# Patient Record
Sex: Female | Born: 1970
Health system: Southern US, Community
[De-identification: ages and names within clinical notes are randomized; demographics above are authoritative.]

## PROBLEM LIST (undated history)

## (undated) DIAGNOSIS — A499 Bacterial infection, unspecified: Secondary | ICD-10-CM

## (undated) DIAGNOSIS — N942 Vaginismus: Secondary | ICD-10-CM

## (undated) DIAGNOSIS — Z9889 Other specified postprocedural states: Secondary | ICD-10-CM

## (undated) DIAGNOSIS — R112 Nausea with vomiting, unspecified: Secondary | ICD-10-CM

## (undated) DIAGNOSIS — N644 Mastodynia: Secondary | ICD-10-CM

## (undated) DIAGNOSIS — D219 Benign neoplasm of connective and other soft tissue, unspecified: Secondary | ICD-10-CM

## (undated) DIAGNOSIS — B379 Candidiasis, unspecified: Secondary | ICD-10-CM

## (undated) DIAGNOSIS — M1990 Unspecified osteoarthritis, unspecified site: Secondary | ICD-10-CM

## (undated) HISTORY — DX: Unspecified osteoarthritis, unspecified site: M19.90

## (undated) HISTORY — DX: Bacterial infection, unspecified: A49.9

## (undated) HISTORY — DX: Vaginismus: N94.2

## (undated) HISTORY — PX: WISDOM TOOTH EXTRACTION: SHX21

## (undated) HISTORY — DX: Candidiasis, unspecified: B37.9

## (undated) HISTORY — PX: FOOT SURGERY: SHX648

## (undated) HISTORY — DX: Mastodynia: N64.4

---

## 2000-07-15 ENCOUNTER — Other Ambulatory Visit: Admission: RE | Admit: 2000-07-15 | Discharge: 2000-07-15 | Payer: Self-pay | Admitting: *Deleted

## 2005-03-29 HISTORY — PX: HAMMER TOE SURGERY: SHX385

## 2006-11-16 LAB — CONVERTED CEMR LAB: Pap Smear: NORMAL

## 2006-12-09 ENCOUNTER — Encounter: Admission: RE | Admit: 2006-12-09 | Discharge: 2006-12-09 | Payer: Self-pay | Admitting: Obstetrics and Gynecology

## 2007-03-03 ENCOUNTER — Ambulatory Visit: Payer: Self-pay | Admitting: Family Medicine

## 2007-03-03 DIAGNOSIS — Z9189 Other specified personal risk factors, not elsewhere classified: Secondary | ICD-10-CM

## 2007-03-07 ENCOUNTER — Encounter (INDEPENDENT_AMBULATORY_CARE_PROVIDER_SITE_OTHER): Payer: Self-pay | Admitting: *Deleted

## 2007-03-07 LAB — CONVERTED CEMR LAB
BUN: 7 mg/dL (ref 6–23)
Basophils Absolute: 0 10*3/uL (ref 0.0–0.1)
Bilirubin, Direct: 0.1 mg/dL (ref 0.0–0.3)
CO2: 29 meq/L (ref 19–32)
Chloride: 102 meq/L (ref 96–112)
Cholesterol: 196 mg/dL (ref 0–200)
Eosinophils Relative: 1.3 % (ref 0.0–5.0)
HCT: 36.3 % (ref 36.0–46.0)
HDL: 71.5 mg/dL (ref >39.0)
Monocytes Absolute: 0.3 10*3/uL (ref 0.2–0.7)
Neutro Abs: 1.9 10*3/uL (ref 1.4–7.7)
Sodium: 137 meq/L (ref 135–145)
Total Protein: 7.3 g/dL (ref 6.0–8.3)
Triglycerides: 87 mg/dL (ref 0–149)
VLDL: 17 mg/dL (ref 0–40)

## 2007-05-16 ENCOUNTER — Ambulatory Visit: Payer: Self-pay | Admitting: Family Medicine

## 2007-05-16 DIAGNOSIS — M21612 Bunion of left foot: Secondary | ICD-10-CM | POA: Insufficient documentation

## 2007-05-16 DIAGNOSIS — M21619 Bunion of unspecified foot: Secondary | ICD-10-CM

## 2007-05-16 DIAGNOSIS — S93409A Sprain of unspecified ligament of unspecified ankle, initial encounter: Secondary | ICD-10-CM | POA: Insufficient documentation

## 2007-06-29 ENCOUNTER — Encounter: Payer: Self-pay | Admitting: Family Medicine

## 2007-11-28 ENCOUNTER — Ambulatory Visit: Payer: Self-pay | Admitting: Family Medicine

## 2007-11-30 ENCOUNTER — Encounter: Admission: RE | Admit: 2007-11-30 | Discharge: 2008-01-29 | Payer: Self-pay | Admitting: Obstetrics and Gynecology

## 2007-12-07 ENCOUNTER — Encounter (INDEPENDENT_AMBULATORY_CARE_PROVIDER_SITE_OTHER): Payer: Self-pay | Admitting: *Deleted

## 2007-12-18 ENCOUNTER — Encounter: Admission: RE | Admit: 2007-12-18 | Discharge: 2007-12-18 | Payer: Self-pay | Admitting: Obstetrics and Gynecology

## 2008-02-07 ENCOUNTER — Ambulatory Visit: Payer: Self-pay | Admitting: Family Medicine

## 2008-02-07 DIAGNOSIS — M549 Dorsalgia, unspecified: Secondary | ICD-10-CM | POA: Insufficient documentation

## 2008-02-07 DIAGNOSIS — M1990 Unspecified osteoarthritis, unspecified site: Secondary | ICD-10-CM | POA: Insufficient documentation

## 2008-02-14 ENCOUNTER — Telehealth: Payer: Self-pay | Admitting: Family Medicine

## 2008-02-26 ENCOUNTER — Ambulatory Visit: Payer: Self-pay | Admitting: Family Medicine

## 2008-02-26 ENCOUNTER — Telehealth: Payer: Self-pay | Admitting: Family Medicine

## 2008-02-26 DIAGNOSIS — M79609 Pain in unspecified limb: Secondary | ICD-10-CM | POA: Insufficient documentation

## 2008-02-26 DIAGNOSIS — E663 Overweight: Secondary | ICD-10-CM | POA: Insufficient documentation

## 2008-02-27 ENCOUNTER — Telehealth (INDEPENDENT_AMBULATORY_CARE_PROVIDER_SITE_OTHER): Payer: Self-pay | Admitting: *Deleted

## 2008-03-01 ENCOUNTER — Telehealth (INDEPENDENT_AMBULATORY_CARE_PROVIDER_SITE_OTHER): Payer: Self-pay | Admitting: *Deleted

## 2008-03-01 ENCOUNTER — Telehealth: Payer: Self-pay | Admitting: Family Medicine

## 2008-03-01 LAB — CONVERTED CEMR LAB
Alkaline Phosphatase: 48 units/L (ref 39–117)
BUN: 15 mg/dL (ref 6–23)
Bilirubin, Direct: 0.1 mg/dL (ref 0.0–0.3)
Chloride: 108 meq/L (ref 96–112)
Cholesterol: 218 mg/dL (ref 0–200)
GFR calc non Af Amer: 59 mL/min
Glucose, Bld: 81 mg/dL (ref 70–99)
HDL: 66 mg/dL (ref >39.0)
Total Bilirubin: 0.6 mg/dL (ref 0.3–1.2)
Total Protein: 7.5 g/dL (ref 6.0–8.3)
Vit D, 1,25-Dihydroxy: 22 — ABNORMAL LOW (ref 30–89)

## 2008-03-05 ENCOUNTER — Telehealth (INDEPENDENT_AMBULATORY_CARE_PROVIDER_SITE_OTHER): Payer: Self-pay | Admitting: *Deleted

## 2008-04-05 ENCOUNTER — Telehealth (INDEPENDENT_AMBULATORY_CARE_PROVIDER_SITE_OTHER): Payer: Self-pay | Admitting: *Deleted

## 2008-04-05 ENCOUNTER — Ambulatory Visit: Payer: Self-pay | Admitting: Family Medicine

## 2008-04-05 DIAGNOSIS — E559 Vitamin D deficiency, unspecified: Secondary | ICD-10-CM | POA: Insufficient documentation

## 2008-04-08 ENCOUNTER — Telehealth (INDEPENDENT_AMBULATORY_CARE_PROVIDER_SITE_OTHER): Payer: Self-pay | Admitting: *Deleted

## 2008-04-10 ENCOUNTER — Telehealth (INDEPENDENT_AMBULATORY_CARE_PROVIDER_SITE_OTHER): Payer: Self-pay | Admitting: *Deleted

## 2008-04-12 ENCOUNTER — Encounter (INDEPENDENT_AMBULATORY_CARE_PROVIDER_SITE_OTHER): Payer: Self-pay | Admitting: *Deleted

## 2008-04-19 ENCOUNTER — Telehealth (INDEPENDENT_AMBULATORY_CARE_PROVIDER_SITE_OTHER): Payer: Self-pay | Admitting: *Deleted

## 2008-07-12 ENCOUNTER — Ambulatory Visit: Payer: Self-pay | Admitting: Family Medicine

## 2008-07-19 ENCOUNTER — Encounter (INDEPENDENT_AMBULATORY_CARE_PROVIDER_SITE_OTHER): Payer: Self-pay | Admitting: *Deleted

## 2008-08-15 ENCOUNTER — Ambulatory Visit: Payer: Self-pay | Admitting: Family Medicine

## 2008-08-15 DIAGNOSIS — J301 Allergic rhinitis due to pollen: Secondary | ICD-10-CM | POA: Insufficient documentation

## 2008-08-15 DIAGNOSIS — R635 Abnormal weight gain: Secondary | ICD-10-CM | POA: Insufficient documentation

## 2008-08-18 LAB — CONVERTED CEMR LAB
Calcium: 9 mg/dL (ref 8.4–10.5)
Creatinine, Ser: 1 mg/dL (ref 0.4–1.2)
Free T4: 0.7 ng/dL (ref 0.6–1.6)
GFR calc non Af Amer: 79.35 mL/min (ref >60)
Glucose, Bld: 82 mg/dL (ref 70–99)
Potassium: 4.3 meq/L (ref 3.5–5.1)
TSH: 0.95 microintl units/mL (ref 0.35–5.50)

## 2008-08-19 ENCOUNTER — Encounter (INDEPENDENT_AMBULATORY_CARE_PROVIDER_SITE_OTHER): Payer: Self-pay | Admitting: *Deleted

## 2008-09-10 ENCOUNTER — Ambulatory Visit: Payer: Self-pay | Admitting: Family Medicine

## 2008-10-14 ENCOUNTER — Ambulatory Visit: Payer: Self-pay | Admitting: Family Medicine

## 2008-12-13 ENCOUNTER — Ambulatory Visit: Payer: Self-pay | Admitting: Family Medicine

## 2008-12-19 ENCOUNTER — Encounter: Admission: RE | Admit: 2008-12-19 | Discharge: 2008-12-19 | Payer: Self-pay | Admitting: Obstetrics and Gynecology

## 2008-12-20 ENCOUNTER — Telehealth (INDEPENDENT_AMBULATORY_CARE_PROVIDER_SITE_OTHER): Payer: Self-pay | Admitting: *Deleted

## 2008-12-24 ENCOUNTER — Encounter: Payer: Self-pay | Admitting: Family Medicine

## 2008-12-24 LAB — CONVERTED CEMR LAB
Albumin: 3.9 g/dL (ref 3.5–5.2)
Alkaline Phosphatase: 48 units/L (ref 39–117)
BUN: 10 mg/dL (ref 6–23)
Basophils Relative: 0.3 % (ref 0.0–3.0)
Bilirubin, Direct: 0 mg/dL (ref 0.0–0.3)
Calcium: 9.3 mg/dL (ref 8.4–10.5)
Chloride: 108 meq/L (ref 96–112)
Creatinine, Ser: 1.1 mg/dL (ref 0.4–1.2)
Eosinophils Absolute: 0.1 10*3/uL (ref 0.0–0.7)
GFR calc non Af Amer: 70.97 mL/min (ref >60)
Glucose, Bld: 85 mg/dL (ref 70–99)
HCT: 37.5 % (ref 36.0–46.0)
Hemoglobin: 12.9 g/dL (ref 12.0–15.0)
Lymphs Abs: 1.6 10*3/uL (ref 0.7–4.0)
Monocytes Absolute: 0.2 10*3/uL (ref 0.1–1.0)
Neutro Abs: 2.1 10*3/uL (ref 1.4–7.7)
Platelets: 206 10*3/uL (ref 150.0–400.0)
RBC: 4 M/uL (ref 3.87–5.11)
Total Protein: 7.5 g/dL (ref 6.0–8.3)
Triglycerides: 97 mg/dL (ref 0.0–149.0)
VLDL: 19.4 mg/dL (ref 0.0–40.0)

## 2009-01-15 ENCOUNTER — Telehealth: Payer: Self-pay | Admitting: Family Medicine

## 2009-01-15 ENCOUNTER — Ambulatory Visit: Payer: Self-pay | Admitting: Family Medicine

## 2009-01-15 DIAGNOSIS — J069 Acute upper respiratory infection, unspecified: Secondary | ICD-10-CM | POA: Insufficient documentation

## 2009-02-11 ENCOUNTER — Ambulatory Visit: Payer: Self-pay | Admitting: Family Medicine

## 2009-02-18 ENCOUNTER — Telehealth (INDEPENDENT_AMBULATORY_CARE_PROVIDER_SITE_OTHER): Payer: Self-pay | Admitting: *Deleted

## 2009-03-14 ENCOUNTER — Ambulatory Visit: Payer: Self-pay | Admitting: Family Medicine

## 2009-03-24 ENCOUNTER — Telehealth: Payer: Self-pay | Admitting: Family Medicine

## 2009-03-24 DIAGNOSIS — M25569 Pain in unspecified knee: Secondary | ICD-10-CM | POA: Insufficient documentation

## 2009-03-27 ENCOUNTER — Encounter: Payer: Self-pay | Admitting: Internal Medicine

## 2009-10-08 ENCOUNTER — Ambulatory Visit: Payer: Self-pay | Admitting: Family Medicine

## 2009-10-08 ENCOUNTER — Encounter (INDEPENDENT_AMBULATORY_CARE_PROVIDER_SITE_OTHER): Payer: Self-pay | Admitting: *Deleted

## 2009-10-08 ENCOUNTER — Telehealth: Payer: Self-pay | Admitting: Family Medicine

## 2009-10-20 ENCOUNTER — Telehealth (INDEPENDENT_AMBULATORY_CARE_PROVIDER_SITE_OTHER): Payer: Self-pay | Admitting: *Deleted

## 2009-10-21 ENCOUNTER — Ambulatory Visit: Payer: Self-pay | Admitting: Family Medicine

## 2010-03-25 ENCOUNTER — Ambulatory Visit
Admission: RE | Admit: 2010-03-25 | Discharge: 2010-03-25 | Payer: Self-pay | Source: Home / Self Care | Attending: Internal Medicine | Admitting: Internal Medicine

## 2010-03-27 ENCOUNTER — Encounter
Admission: RE | Admit: 2010-03-27 | Discharge: 2010-03-27 | Payer: Self-pay | Source: Home / Self Care | Attending: Obstetrics and Gynecology | Admitting: Obstetrics and Gynecology

## 2010-04-03 ENCOUNTER — Telehealth: Payer: Self-pay | Admitting: Family Medicine

## 2010-04-10 ENCOUNTER — Ambulatory Visit
Admission: RE | Admit: 2010-04-10 | Discharge: 2010-04-10 | Payer: Self-pay | Source: Home / Self Care | Attending: Family Medicine | Admitting: Family Medicine

## 2010-04-10 ENCOUNTER — Ambulatory Visit (HOSPITAL_BASED_OUTPATIENT_CLINIC_OR_DEPARTMENT_OTHER)
Admission: RE | Admit: 2010-04-10 | Discharge: 2010-04-10 | Payer: Self-pay | Source: Home / Self Care | Attending: Family Medicine | Admitting: Family Medicine

## 2010-04-13 ENCOUNTER — Telehealth: Payer: Self-pay | Admitting: Family Medicine

## 2010-04-16 ENCOUNTER — Telehealth (INDEPENDENT_AMBULATORY_CARE_PROVIDER_SITE_OTHER): Payer: Self-pay | Admitting: *Deleted

## 2010-04-17 ENCOUNTER — Ambulatory Visit
Admission: RE | Admit: 2010-04-17 | Discharge: 2010-04-17 | Payer: Self-pay | Source: Home / Self Care | Attending: Family Medicine | Admitting: Family Medicine

## 2010-04-17 ENCOUNTER — Encounter: Payer: Self-pay | Admitting: Family Medicine

## 2010-04-17 DIAGNOSIS — R05 Cough: Secondary | ICD-10-CM | POA: Insufficient documentation

## 2010-04-17 DIAGNOSIS — R053 Chronic cough: Secondary | ICD-10-CM | POA: Insufficient documentation

## 2010-04-19 ENCOUNTER — Encounter: Payer: Self-pay | Admitting: Obstetrics and Gynecology

## 2010-04-26 LAB — CONVERTED CEMR LAB
ALT: 20 units/L (ref 0–35)
ALT: 28 units/L (ref 0–35)
AST: 24 units/L (ref 0–37)
AST: 25 units/L (ref 0–37)
AST: 37 units/L (ref 0–37)
Alkaline Phosphatase: 46 units/L (ref 39–117)
BUN: 11 mg/dL (ref 6–23)
Basophils Absolute: 0 10*3/uL (ref 0.0–0.1)
Basophils Absolute: 0.1 10*3/uL (ref 0.0–0.1)
Basophils Relative: 0.6 % (ref 0.0–3.0)
Bilirubin, Direct: 0.1 mg/dL (ref 0.0–0.3)
Bilirubin, Direct: 0.1 mg/dL (ref 0.0–0.3)
Bilirubin, Direct: 0.1 mg/dL (ref 0.0–0.3)
Blood in Urine, dipstick: NEGATIVE
CO2: 25 meq/L (ref 19–32)
CO2: 27 meq/L (ref 19–32)
Calcium: 9.1 mg/dL (ref 8.4–10.5)
Calcium: 9.3 mg/dL (ref 8.4–10.5)
Chloride: 107 meq/L (ref 96–112)
Chloride: 107 meq/L (ref 96–112)
Creatinine, Ser: 1 mg/dL (ref 0.4–1.2)
Creatinine, Ser: 1.1 mg/dL (ref 0.4–1.2)
Direct LDL: 112.5 mg/dL
Eosinophils Absolute: 0 10*3/uL (ref 0.0–0.7)
HCT: 38.4 % (ref 36.0–46.0)
HDL: 51.7 mg/dL (ref >39.0)
Hemoglobin: 13.2 g/dL (ref 12.0–15.0)
Ketones, urine, test strip: NEGATIVE
LDL Cholesterol: 100 mg/dL — ABNORMAL HIGH (ref 0–99)
LDL Cholesterol: 129 mg/dL — ABNORMAL HIGH (ref 0–99)
Lymphocytes Relative: 28.4 % (ref 12.0–46.0)
Lymphocytes Relative: 36.8 % (ref 12.0–46.0)
MCHC: 34.6 g/dL (ref 30.0–36.0)
MCV: 92.8 fL (ref 78.0–100.0)
MCV: 95.4 fL (ref 78.0–100.0)
Monocytes Absolute: 0.2 10*3/uL (ref 0.1–1.0)
Monocytes Absolute: 0.3 10*3/uL (ref 0.1–1.0)
Monocytes Relative: 6.2 % (ref 3.0–12.0)
Monocytes Relative: 9.9 % (ref 3.0–12.0)
Neutrophils Relative %: 55.1 % (ref 43.0–77.0)
Neutrophils Relative %: 58.9 % (ref 43.0–77.0)
Nitrite: NEGATIVE
Platelets: 191 10*3/uL (ref 150.0–400.0)
Potassium: 4 meq/L (ref 3.5–5.1)
Protein, U semiquant: NEGATIVE
Sodium: 140 meq/L (ref 135–145)
Total Bilirubin: 0.6 mg/dL (ref 0.3–1.2)
Total Protein: 7.4 g/dL (ref 6.0–8.3)
Total Protein: 7.5 g/dL (ref 6.0–8.3)
Triglycerides: 53 mg/dL (ref 0.0–149.0)
Triglycerides: 91 mg/dL (ref 0–149)
Triglycerides: 97 mg/dL (ref 0.0–149.0)
Urobilinogen, UA: NEGATIVE
Vit D, 25-Hydroxy: 30 ng/mL (ref 30–89)
WBC: 2.7 10*3/uL — ABNORMAL LOW (ref 4.5–10.5)

## 2010-04-28 NOTE — Letter (Signed)
Summary: [facility]: Immunoassay Fecal Occult Blood (iFOB) Order Form  [facility] [place]  [address] [place], [place] [address]   Phone: [phone]  Fax: [phone]       Lab: Immunoassay Fecal Occult Blood (iFOB) Order Form   October 08, 2009 MRN: [id number]   Shelley Norman [birth year]   Physicican Name:______dr lowne___________________  Diagnosis Code:_______v76.51___________________      Jeremy Johann CMA

## 2010-04-28 NOTE — Progress Notes (Signed)
Summary: Refill Request  Phone Note Call from Patient Message from:  Patient on October 20, 2009 1:15 PM  Refills Requested: Medication #1:  BEE POLLEN/ROYAL JELLY/HONEY 1000 MG TABS 1 by mouth once daily   Dosage confirmed as above?Dosage Confirmed Need the bee pollen without the royal jelly  Reason for Call: Refill Medication Summary of Call: Patient needs new rx for her bee pollen without the royal jelly and the new vitmain d 2000u sent to [facility] on [address]. This is mainly for her insuance purposes. Thanks! Initial call taken by: Harold Barban,  October 20, 2009 1:16 PM    New/Updated Medications: VITAMIN D3 2000 UNIT TABS (CHOLECALCIFEROL) take 1 tab once daily Prescriptions: VITAMIN D3 2000 UNIT TABS (CHOLECALCIFEROL) take 1 tab once daily  #30 x 11   Entered by:   Jeremy Johann CMA   Authorized by:   Loreen Freud DO   Signed by:   Jeremy Johann CMA on 10/20/2009   Method used:   Re-Faxed to ...       [facility]  [address] [id number]* (retail)       [address]       [place], [place]  [address]       Ph: [phone] or [phone]       Fax: [phone]   RxID:   [id number] BEE POLLEN/ROYAL JELLY/HONEY 1000 MG TABS (NUTRITIONAL SUPPLEMENTS) 1 by mouth once daily  #30 x 11   Entered by:   Jeremy Johann CMA   Authorized by:   Loreen Freud DO   Signed by:   Jeremy Johann CMA on 10/20/2009   Method used:   Re-Faxed to ...       [facility]  [address] [id number]* (retail)       [address]       [place], [place]  [address]       Ph: [phone] or [phone]       Fax: [phone]   RxID:   [id number]

## 2010-04-28 NOTE — Consult Note (Signed)
Summary: [facility]  [facility]   Imported By: Lanelle Bal 04/10/2009 08:05:50  _____________________________________________________________________  External Attachment:    Type:   Image     Comment:   External Document

## 2010-04-28 NOTE — Progress Notes (Signed)
Summary: question about blood counts  Phone Note Call from Patient   Caller: Patient Summary of Call: Pt walks in and would like for somebody to call her to let her know if there is anything she can do to increase her white and red blood count. pt can be reached at [phone]. Initial call taken by: Lavell Islam,  October 20, 2009 4:25 PM  Follow-up for Phone Call        pls advise............Marland KitchenFelecia Deloach CMA  October 20, 2009 4:37 PM   Additional Follow-up for Phone Call Additional follow up Details #1::        to increase it ---there is nothing we can do -- there is nothing that needs to be done ---if the rest of the cbc was abnormal we would consider hematology referral but can be normal for AA to have low WBC Additional Follow-up by: Loreen Freud DO,  October 20, 2009 4:42 PM    Additional Follow-up for Phone Call Additional follow up Details #2::    left message to call  office.............Marland KitchenFelecia Deloach CMA  October 20, 2009 5:13 PM   Patient called and LM on triage line asking about lab results, would like call back.Harold Barban  October 21, 2009 1:08 PM  lmtcb.Harold Barban  October 21, 2009 1:08 PM  Additional Follow-up for Phone Call Additional follow up Details #3:: Details for Additional Follow-up Action Taken: DISCUSS WITH PATIENT .......Marland KitchenFelecia Deloach CMA  October 21, 2009 4:09 PM

## 2010-04-28 NOTE — Progress Notes (Signed)
Summary: refill  Phone Note Call from Patient   Caller: Patient Summary of Call: patient was seen [phone] - she said rx for vit d 1000 units was called in but she said she also takes 04540 units & that wasnt called in [facility] [address] Initial call taken by: Okey Regal Spring,  October 08, 2009 11:28 AM  Follow-up for Phone Call        Per office visit note patient is taking both, sent prescription. Left message on machine notifying patient. Follow-up by: Lucious Groves,  October 08, 2009 11:30 AM    New/Updated Medications: VITAMIN D (ERGOCALCIFEROL) 50000 UNIT CAPS (ERGOCALCIFEROL) 1 by mouth weekly Prescriptions: VITAMIN D (ERGOCALCIFEROL) 50000 UNIT CAPS (ERGOCALCIFEROL) 1 by mouth weekly  #4 x 2   Entered by:   Lucious Groves   Authorized by:   Loreen Freud DO   Signed by:   Lucious Groves on 10/08/2009   Method used:   Electronically to        [facility]  [address] [id number]* (retail)       [address]       [place], [place]  [address]       Ph: [phone] or [phone]       Fax: [phone]   RxID:   [id number]     Allergies: 1)  ! Tetracycline

## 2010-04-28 NOTE — Assessment & Plan Note (Signed)
Summary: cpx/fasting//kn   Vital Signs:  Patient profile:   40 year old female Height:      62 inches Weight:      148 pounds BMI:     27.17 Temp:     98.5 degrees F oral Pulse rate:   78 / minute Resp:     18 per minute BP sitting:   90 / 58  (left arm)  Vitals Entered By: Jeremy Johann CMA (October 08, 2009 9:22 AM)  nCC: cpx, fasting, no pap Comments REVIEWED MED LIST, PATIENT AGREED DOSE AND INSTRUCTION CORRECT    History of Present Illness: Pt here for cpe and labs.  Pt now has new job with [employer].  Pt neeeds rx for all otc meds she takes.  No other complaints.    Preventive Screening-Counseling & Management  Alcohol-Tobacco     Alcohol drinks/day: <1     Alcohol type: wine, brandy     Smoking Status: never     Passive Smoke Exposure: no  Caffeine-Diet-Exercise     Caffeine use/day: 0     Does Patient Exercise: yes     Type of exercise: aerobics, weights     Times/week: 4  Hep-HIV-STD-Contraception     HIV Risk: no     Dental Visit-last 6 months yes     Dental Care Counseling: not indicated; dental care within six months     SBE monthly: yes  Safety-Violence-Falls     Seat Belt Use: 100  Current Medications (verified): 1)  Ortho Tri-Cyclen Lo 0.025 Mg Tabs (Norgestimate-Ethinyl Estradiol) .... As Directed 2)  Astepro 0.15 % Soln (Azelastine Hcl) .... 2 Sprays Each Nostril Once Daily 3)  Zyrtec Allergy 10 Mg Tabs (Cetirizine Hcl) .Marland Kitchen.. 1 By Mouth Once Daily 4)  Veramyst 27.5 Mcg/spray Susp (Fluticasone Furoate) .... 2 Sprays Each Nostril Once Daily 5)  Vitamin D (Ergocalciferol) 50000 Unit Caps (Ergocalciferol) .Marland Kitchen.. 1 By Mouth Weekly 6)  Calcium Carbonate 600 Mg Tabs (Calcium Carbonate) .Marland Kitchen.. 1 By Mouth Two Times A Day 7)  Magnesium 250 Mg Tabs (Magnesium) .Marland Kitchen.. 1 By Mouth Once Daily 8)  Vitamin D3 1000 Unit Tabs (Cholecalciferol) .Marland Kitchen.. 1 By Mouth Once Daily 9)  Fish Oil 1000 Mg Caps (Omega-3 Fatty Acids) .Marland Kitchen.. 1 By Mouth Two Times A Day 10)  Osteo Bi-Flex Adv Joint  Shield  Tabs (Misc Natural Products) .Marland Kitchen.. 1 By Mouth Two Times A Day 11)  Bee Pollen/royal Jelly/honey 1000 Mg Tabs (Nutritional Supplements) .Marland Kitchen.. 1 By Mouth Once Daily 12)  Hard Nails 2.5 Mg Caps (Biotin) .Marland Kitchen.. 1 By Mouth Once Daily 13)  Rogaine Mens Extra Strength 5 % Foam (Minoxidil) .... As Directed  Allergies: 1)  ! 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 Family History Diabetes 1st degree relative--F Family History High cholesterol-- M Family History Hypertension-- M Family History Breast cancer 1st degree relative <50---S Family History Osteoporosis  Social History: Reviewed history from 03/03/2007 and no changes required. 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 Eyes:  Denies blurring, discharge, double vision, eye irritation, eye pain, halos, itching, light sensitivity, red eye, vision loss-1 eye, and vision loss-both eyes; optho-- q1y. ENT:  Denies  decreased hearing, difficulty swallowing, ear discharge, earache, hoarseness, nasal congestion, nosebleeds, postnasal drainage, ringing in ears, sinus pressure, and sore throat. CV:  Denies bluish discoloration of lips or nails, chest pain or discomfort, difficulty breathing at night, difficulty breathing while lying down, fainting, fatigue, leg cramps with exertion, lightheadness, near fainting, palpitations, shortness of breath with exertion, swelling of feet, swelling of hands, and weight gain. Resp:  Denies chest discomfort, chest pain with inspiration, cough, coughing up blood, excessive snoring, hypersomnolence, morning headaches, pleuritic, shortness of breath, sputum productive, and wheezing. GI:  Denies abdominal pain, bloody stools, change in bowel habits, constipation, dark tarry stools, diarrhea, excessive appetite, gas, hemorrhoids, indigestion, loss of appetite, nausea, vomiting, vomiting blood, and yellowish skin color. GU:  Denies abnormal vaginal bleeding, decreased libido, discharge, dysuria, genital sores, hematuria, incontinence, nocturia, urinary frequency, and urinary hesitancy. MS:  Complains of joint pain; denies joint redness, joint swelling, loss of strength, low back pain, mid back pain, muscle aches, muscle , cramps, muscle weakness, stiffness, and thoracic pain; knee pain. Derm:  Denies changes in color of skin, changes in nail beds, dryness, excessive perspiration, flushing, hair loss, insect bite(s), itching, lesion(s), poor wound healing, and rash. Neuro:  Denies brief paralysis, difficulty with concentration, disturbances in coordination, falling down, headaches, inability to speak, memory loss, numbness, poor balance, seizures, sensation of room spinning, tingling, tremors, visual disturbances, and weakness. Psych:  Denies alternate hallucination ( auditory/visual), anxiety, depression, easily angered, easily tearful, irritability, mental problems, panic attacks, sense of  great danger, suicidal thoughts/plans, thoughts of violence, unusual visions or sounds, and thoughts /plans of harming others. Endo:  Denies cold intolerance, excessive hunger, excessive thirst, excessive urination, heat intolerance, polyuria, and weight change. Heme:  Denies abnormal bruising, bleeding, enlarge lymph nodes, fevers, pallor, and skin discoloration. Allergy:  Denies hives or rash, itching eyes, persistent infections, seasonal allergies, and sneezing.  Physical Exam  General:  Well-developed,well-nourished,in no acute distress; alert,appropriate and cooperative throughout examination Head:  Normocephalic and atraumatic without obvious abnormalities. No apparent alopecia or balding. Eyes:  vision grossly intact, pupils equal, pupils round, pupils reactive to light, and no injection.   Ears:  External ear exam shows no significant lesions or deformities.  Otoscopic examination reveals clear canals, tympanic membranes are intact bilaterally without bulging, retraction, inflammation or discharge. Hearing is grossly normal bilaterally. Nose:  External nasal examination shows no deformity or inflammation. Nasal mucosa are pink and moist without lesions or exudates. Mouth:  Oral mucosa and oropharynx without lesions or exudates.  Teeth in good repair. Neck:  No deformities, masses, or tenderness noted. Chest Wall:  No deformities, masses, or tenderness noted. Breasts:  gyn Lungs:  Normal respiratory effort, chest expands symmetrically. Lungs are clear to auscultation, no crackles or wheezes. Heart:  normal rate and no murmur.   Abdomen:  Bowel sounds positive,abdomen soft and non-tender without masses, organomegaly or hernias noted. Rectal:  gyn Genitalia:  gyn Msk:  normal ROM, no joint tenderness, no joint swelling, no joint warmth, no redness over joints, no joint  deformities, no joint instability, and no crepitation.   Pulses:  R posterior tibial normal, R dorsalis pedis normal, R  carotid normal, L posterior tibial normal, L dorsalis pedis normal, and L carotid normal.   Extremities:  No clubbing, cyanosis, edema, or deformity noted with normal full range of motion of all joints.   Neurologic:  No cranial nerve deficits noted. Station and gait are normal. Plantar reflexes are down-going bilaterally. DTRs are symmetrical throughout. Sensory, motor and coordinative functions appear intact. Skin:  Intact without suspicious lesions or rashes Cervical Nodes:  No lymphadenopathy noted Axillary Nodes:  No palpable lymphadenopathy Psych:  Cognition and judgment appear intact. Alert and cooperative with normal attention span and concentration. No apparent delusions, illusions, hallucinations   Impression & Recommendations:  Problem # 1:  PREVENTIVE HEALTH CARE (ICD-V70.0) check fasting labs ghm utd  Problem # 2:  KNEE PAIN, RIGHT (ICD-719.46)  Discussed strengthening exercises, use of ice or heat, and medications.   Problem # 3:  VITAMIN D DEFICIENCY (ICD-268.9)  Orders: Venipuncture ([address]) TLB-Lipid Panel (80061-LIPID) TLB-BMP (Basic Metabolic Panel-BMET) (80048-METABOL) TLB-CBC Platelet - w/Differential (85025-CBCD) TLB-Hepatic/Liver Function Pnl (80076-HEPATIC) TLB-TSH (Thyroid Stimulating Hormone) (84443-TSH) T-Vitamin D (25-Hydroxy) ([address])  Complete Medication List: 1)  Ortho Tri-cyclen Lo 0.025 Mg Tabs (Norgestimate-ethinyl estradiol) .... As directed 2)  Astepro 0.15 % Soln (Azelastine hcl) .... 2 sprays each nostril once daily 3)  Zyrtec Allergy 10 Mg Tabs (Cetirizine hcl) .Marland Kitchen.. 1 by mouth once daily 4)  Veramyst 27.5 Mcg/spray Susp (Fluticasone furoate) .... 2 sprays each nostril once daily 5)  Vitamin D (ergocalciferol) 50000 Unit Caps (Ergocalciferol) .Marland Kitchen.. 1 by mouth weekly 6)  Calcium Carbonate 600 Mg Tabs (Calcium carbonate) .Marland Kitchen.. 1 by mouth two times a day 7)  Magnesium 250 Mg Tabs (Magnesium) .Marland Kitchen.. 1 by mouth once daily 8)  Vitamin D3 1000 Unit  Tabs (Cholecalciferol) .Marland Kitchen.. 1 by mouth once daily 9)  Fish Oil 1000 Mg Caps (Omega-3 fatty acids) .Marland Kitchen.. 1 by mouth two times a day 10)  Osteo Bi-flex Adv Joint Shield Tabs (Misc natural products) .Marland Kitchen.. 1 by mouth two times a day 11)  Bee Pollen/royal Jelly/honey 1000 Mg Tabs (Nutritional supplements) .Marland Kitchen.. 1 by mouth once daily 12)  Hard Nails 2.5 Mg Caps (Biotin) .Marland Kitchen.. 1 by mouth once daily 13)  Rogaine Mens Extra Strength 5 % Foam (Minoxidil) .... As directed  Other Orders: UA Dipstick w/o Micro (manual) (91478) Prescriptions: ROGAINE MENS EXTRA STRENGTH 5 % FOAM (MINOXIDIL) as directed  #1 bottle x 5   Entered and Authorized by:   Loreen Freud DO   Signed by:   Loreen Freud DO on 10/08/2009   Method used:   Electronically to        [facility]* (retail)       [address]       [place], [place]  [address]       Ph: [phone] or [phone]       Fax: [phone]   RxID:   [id number] HARD NAILS 2.5 MG CAPS (BIOTIN) 1 by mouth once daily  #30 x 11   Entered and Authorized by:   Loreen Freud DO   Signed by:   Loreen Freud DO on 10/08/2009   Method used:   Electronically to        [facility]* (retail)       [address]  [place], [place]  [address]       Ph: [phone] or [phone]       Fax: [phone]   RxID:   [id number] BEE POLLEN/ROYAL JELLY/HONEY 1000 MG TABS (NUTRITIONAL SUPPLEMENTS) 1 by mouth once daily  #30 x 11   Entered and Authorized by:   Loreen Freud DO   Signed by:   Loreen Freud DO on 10/08/2009   Method used:   Electronically to        [facility]* (retail)       [address]       [place], [place]  [address]       Ph: [phone] or [phone]       Fax: [phone]   RxID:   [id number] OSTEO BI-FLEX ADV JOINT SHIELD  TABS (MISC NATURAL PRODUCTS) 1 by mouth two times a day  #60 x 11   Entered and Authorized by:    Loreen Freud DO   Signed by:   Loreen Freud DO on 10/08/2009   Method used:   Electronically to        [facility]* (retail)       [address]       [place], [place]  [address]       Ph: [phone] or [phone]       Fax: [phone]   RxID:   [id number] FISH OIL 1000 MG CAPS (OMEGA-3 FATTY ACIDS) 1 by mouth two times a day  #60 x 11   Entered and Authorized by:   Loreen Freud DO   Signed by:   Loreen Freud DO on 10/08/2009   Method used:   Electronically to        [facility]* (retail)       [address]       [place], [place]  [address]       Ph: [phone] or [phone]       Fax: [phone]   RxID:   [id number] VITAMIN D3 1000 UNIT TABS (CHOLECALCIFEROL) 1 by mouth once daily  #30 x 11   Entered and Authorized by:   Loreen Freud DO   Signed by:   Loreen Freud DO on 10/08/2009   Method used:   Electronically to        [facility]* (retail)       [address]       [place], [place]  [address]       Ph: [phone] or [phone]       Fax: [phone]   RxID:   [id number] MAGNESIUM 250 MG TABS (MAGNESIUM) 1 by mouth once daily  #30 x 11   Entered and Authorized by:   Loreen Freud DO   Signed by:   Loreen Freud DO on 10/08/2009   Method used:   Electronically to        [facility] [address]* (retail)       [address]       [place], [place]  [address]       Ph: [phone] or [phone]       Fax: [phone]   RxID:  [id number] CALCIUM CARBONATE 600 MG TABS (CALCIUM CARBONATE) 1 by mouth two times a day  #60 x 11   Entered and Authorized by:   Loreen Freud DO   Signed by:   Loreen Freud DO on 10/08/2009   Method used:   Electronically to        [facility]* (retail)       [address]       [place], [place]  [address]       Ph: [phone] or  [phone]       Fax: [phone]   RxID:   [id number]    EKG  Procedure date:  10/08/2009  Findings:      Sinus bradycardia with rate of:  57 bpm   Last PAP:  Normal (11/16/2006 10:44:27 AM) PAP Result Date:  11/06/2008 PAP Result:  normal PAP Next Due:  1 yr Last Mammogram:  Normal Bilateral (11/16/2006 10:44:27 AM) Mammogram Result Date:  11/06/2008 Mammogram Result:  normal Mammogram Next Due:  1 yr  Laboratory Results   Urine Tests   Date/Time Reported: October 08, 2009 1:01 PM   Routine Urinalysis   Color: yellow Appearance: Clear Glucose: negative   (Normal Range: Negative) Bilirubin: negative   (Normal Range: Negative) Ketone: negative   (Normal Range: Negative) Spec. Gravity: <1.005   (Normal Range: 1.003-1.035) Blood: negative   (Normal Range: Negative) pH: 6.0   (Normal Range: 5.0-8.0) Protein: negative   (Normal Range: Negative) Urobilinogen: negative   (Normal Range: 0-1) Nitrite: negative   (Normal Range: Negative) Leukocyte Esterace: negative   (Normal Range: Negative)    Comments: Floydene Flock  October 08, 2009 1:01 PM

## 2010-04-30 NOTE — Assessment & Plan Note (Signed)
Summary: FOR A COUGH//PH   Vital Signs:  Patient profile:   40 year old female Weight:      153.2 pounds Temp:     98.4 degrees F oral Pulse rate:   76 / minute Pulse rhythm:   regular BP sitting:   130 / 90  (right arm) Cuff size:   regular  Vitals Entered By: Almeta Monas CMA Duncan Dull) (April 17, 2010 2:24 PM) CC: c/o cough not any better--worst when she goes to [employer] she used Tessalon perles in the past and it was the only thing that gave her relief, Cough   History of Present Illness:  Cough      This is a 40 year old woman who presents with Cough.  The symptoms began 3 weeks ago.  Pt has been on 3 abx, cough meds,  mucinex with no relief.  The patient reports non-productive cough, shortness of breath, and wheezing, but denies productive cough, pleuritic chest pain, exertional dyspnea, fever, hemoptysis, and malaise.  The patient denies the following symptoms: cold/URI symptoms, sore throat, nasal congestion, chronic rhinitis, weight loss, acid reflux symptoms, and peripheral edema.  The cough is worse with activity.  Ineffective prior treatments have included OTC cough medication.  Diagnostic testing to date has included CXR.    Current Medications (verified): 1)  Ortho Tri-Cyclen Lo 0.025 Mg Tabs (Norgestimate-Ethinyl Estradiol) .... As Directed 2)  Astepro 0.15 % Soln (Azelastine Hcl) .... 2 Sprays Each Nostril Once Daily 3)  Zyrtec Allergy 10 Mg Tabs (Cetirizine Hcl) .Marland Kitchen.. 1 By Mouth Once Daily 4)  Veramyst 27.5 Mcg/spray Susp (Fluticasone Furoate) .... 2 Sprays Each Nostril Once Daily 5)  Calcium Carbonate 600 Mg Tabs (Calcium Carbonate) .Marland Kitchen.. 1 By Mouth Two Times A Day 6)  Magnesium 250 Mg Tabs (Magnesium) .Marland Kitchen.. 1 By Mouth Once Daily 7)  Vitamin D3 2000 Unit Tabs (Cholecalciferol) .... Take 1 Tab Once Daily 8)  Fish Oil 1000 Mg Caps (Omega-3 Fatty Acids) .Marland Kitchen.. 1 By Mouth Two Times A Day 9)  Osteo Bi-Flex Adv Joint Shield  Tabs (Misc Natural Products) .Marland Kitchen.. 1 By Mouth Two  Times A Day 10)  Bee Pollen/royal Jelly/honey 1000 Mg Tabs (Nutritional Supplements) .Marland Kitchen.. 1 By Mouth Once Daily 11)  Hard Nails 2.5 Mg Caps (Biotin) .Marland Kitchen.. 1 By Mouth Once Daily 12)  Rogaine Mens Extra Strength 5 % Foam (Minoxidil) .... As Directed 13)  Zithromax Tri-Pak 500 Mg Tabs (Azithromycin) .... Take By Mouth As Directed 14)  Cheratussin Ac 100-10 Mg/22ml Syrp (Guaifenesin-Codeine) .Marland Kitchen.. 1-2 Tsp By Mouth At Bedtime 15)  Tessalon 200 Mg Caps (Benzonatate) .Marland Kitchen.. 1 By Mouth Three Times A Day 16)  Qvar 40 Mcg/act Aers (Beclomethasone Dipropionate) .... 2 Puffs Two Times A Day 17)  Nasonex 50 Mcg/act Susp (Mometasone Furoate) .... 2 Sprays Each Nostril Once Daily  Allergies (verified): 1)  ! Tetracycline  Past History:  Past medical, surgical, family and social histories (including risk factors) reviewed for relevance to current acute and chronic problems.  Past Medical History: Reviewed history from 04/05/2008 and no changes required. Unremarkable Osteoarthritis vita d def  Past Surgical History: Reviewed history from 03/03/2007 and no changes required. foot surgery 2006  Family History: Reviewed history from 11/28/2007 and no changes required. Family History Diabetes 1st degree relative--F Family History High cholesterol-- M Family History Hypertension-- M Family History Breast cancer 1st degree relative <50---S Family History Osteoporosis  Social History: Reviewed history from 03/03/2007 and no changes required. 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  Otoscopic examination reveals clear canals, tympanic membranes are intact bilaterally without bulging, retraction,  inflammation or discharge. Hearing is grossly normal bilaterally. Nose:  External nasal examination shows no deformity or inflammation. Nasal mucosa are pink and moist without lesions or exudates. Mouth:  postnasal drip.   Neck:  No deformities, masses, or tenderness noted. Lungs:  Normal respiratory effort, chest expands symmetrically. Lungs are clear to auscultation, no crackles or wheezes. Heart:  Normal rate and regular rhythm. S1 and S2 normal without gallop, murmur, click, rub or other extra sounds. Psych:  Cognition and judgment appear intact. Alert and cooperative with normal attention span and concentration. No apparent delusions, illusions, hallucinations   Impression & Recommendations:  Problem # 1:  COUGH, CHRONIC (ICD-786.2) tessalon perles qvar  pulm consult  Complete Medication List: 1)  Ortho Tri-cyclen Lo 0.025 Mg Tabs (Norgestimate-ethinyl estradiol) .... As directed 2)  Astepro 0.15 % Soln (Azelastine hcl) .... 2 sprays each nostril once daily 3)  Zyrtec Allergy 10 Mg Tabs (Cetirizine hcl) .Marland Kitchen.. 1 by mouth once daily 4)  Veramyst 27.5 Mcg/spray Susp (Fluticasone furoate) .... 2 sprays each nostril once daily 5)  Calcium Carbonate 600 Mg Tabs (Calcium carbonate) .Marland Kitchen.. 1 by mouth two times a day 6)  Magnesium 250 Mg Tabs (Magnesium) .Marland Kitchen.. 1 by mouth once daily 7)  Vitamin D3 2000 Unit Tabs (Cholecalciferol) .... Take 1 tab once daily 8)  Fish Oil 1000 Mg Caps (Omega-3 fatty acids) .Marland Kitchen.. 1 by mouth two times a day 9)  Osteo Bi-flex Adv Joint Shield Tabs (Misc natural products) .Marland Kitchen.. 1 by mouth two times a day 10)  Bee Pollen/royal Jelly/honey 1000 Mg Tabs (Nutritional supplements) .Marland Kitchen.. 1 by mouth once daily 11)  Hard Nails 2.5 Mg Caps (Biotin) .Marland Kitchen.. 1 by mouth once daily 12)  Rogaine Mens Extra Strength 5 % Foam (Minoxidil) .... As directed 13)  Zithromax Tri-pak 500 Mg Tabs (Azithromycin) .... Take by mouth as directed 14)  Cheratussin Ac 100-10 Mg/2ml Syrp  (Guaifenesin-codeine) .Marland Kitchen.. 1-2 tsp by mouth at bedtime 15)  Tessalon 200 Mg Caps (Benzonatate) .Marland Kitchen.. 1 by mouth three times a day 16)  Qvar 40 Mcg/act Aers (Beclomethasone dipropionate) .... 2 puffs two times a day 17)  Nasonex 50 Mcg/act Susp (Mometasone furoate) .... 2 sprays each nostril once daily 18)  Qvar 40 Mcg/act Aers (Beclomethasone dipropionate) .... 2 puffs two times a day  Other Orders: Pulmonary Referral (Pulmonary)  Patient Instructions: 1)  use Qvar 2 puffs two times a day 2)  use astepro at night and nasonex in am 3)  take zyrtec 4)  tessalon perles if needed. Prescriptions: TESSALON 200 MG CAPS (BENZONATATE) 1 by mouth three times a day  #30 x 0   Entered and Authorized by:   Loreen Freud DO   Signed by:   Loreen Freud DO on 04/17/2010   Method used:   Electronically to        [facility] [id number]* (retail)       [address]       [place], [place]  [address]       Ph: [phone] or [phone]       Fax: [phone]  RxID:   [id number]    Orders Added: 1)  Pulmonary Referral [Pulmonary] 2)  Est. Patient Level III [14782]  Appended Document: Orders Update    Clinical Lists Changes  Orders: Added new Service order of Spirometry w/Graph (94010) - Signed

## 2010-04-30 NOTE — Assessment & Plan Note (Signed)
Summary: cold and cough--no fever////sph   Vital Signs:  Patient profile:   40 year old female Height:      62 inches (157.48 cm) Weight:      153.13 pounds (69.60 kg) BMI:     28.11 Temp:     99.6 degrees F (37.56 degrees C) oral Resp:     14 per minute BP sitting:   100 / 60  (left arm) Cuff size:   regular  Vitals Entered By: Lucious Groves CMA (March 25, 2010 12:13 PM) CC: Possible URI x10 days./kb, URI symptoms Is Patient Diabetic? No Pain Assessment Patient in pain? no      Comments Patient notes that she has been having HA, severe cough with yellow mucous production, and congestion. She denies fever, SOB, and chest pain. Patient is requesting Tussionex prescription.   Primary Care Provider:  Laury Axon  CC:  Possible URI x10 days./kb and URI symptoms.  History of Present Illness:      This is a 40 year old woman who presents with RTI symptoms; onset 12/18 as ST.  The patient now reports nasal congestion, purulent nasal discharge, sore throat from PNDrainage , and productive cough, but denies earache.  The patient denies fever, dyspnea, and wheezing.  The patient denies headache.  The patient denies the following risk factors for Strep sinusitis: unilateral facial pain, tooth pain, and tender adenopathy.  Rx: Zicam, Delsym, Mucinex  Current Medications (verified): 1)  Ortho Tri-Cyclen Lo 0.025 Mg Tabs (Norgestimate-Ethinyl Estradiol) .... As Directed 2)  Astepro 0.15 % Soln (Azelastine Hcl) .... 2 Sprays Each Nostril Once Daily 3)  Zyrtec Allergy 10 Mg Tabs (Cetirizine Hcl) .Marland Kitchen.. 1 By Mouth Once Daily 4)  Veramyst 27.5 Mcg/spray Susp (Fluticasone Furoate) .... 2 Sprays Each Nostril Once Daily 5)  Calcium Carbonate 600 Mg Tabs (Calcium Carbonate) .Marland Kitchen.. 1 By Mouth Two Times A Day 6)  Magnesium 250 Mg Tabs (Magnesium) .Marland Kitchen.. 1 By Mouth Once Daily 7)  Vitamin D3 2000 Unit Tabs (Cholecalciferol) .... Take 1 Tab Once Daily 8)  Fish Oil 1000 Mg Caps (Omega-3 Fatty Acids) .Marland Kitchen.. 1 By Mouth  Two Times A Day 9)  Osteo Bi-Flex Adv Joint Shield  Tabs (Misc Natural Products) .Marland Kitchen.. 1 By Mouth Two Times A Day 10)  Bee Pollen/royal Jelly/honey 1000 Mg Tabs (Nutritional Supplements) .Marland Kitchen.. 1 By Mouth Once Daily 11)  Hard Nails 2.5 Mg Caps (Biotin) .Marland Kitchen.. 1 By Mouth Once Daily 12)  Rogaine Mens Extra Strength 5 % Foam (Minoxidil) .... As Directed  Allergies (verified): 1)  ! Tetracycline  Physical Exam  General:  well-nourished,in no acute distress; alert,appropriate and cooperative throughout examination Ears:  R ear normal and L Cerumen  partially occluded with wax Nose:  External nasal examination shows no deformity or inflammation. Nasal mucosa are pink and moist without lesions or exudates. Mouth:  Oral mucosa and oropharynx without lesions or exudates.  Teeth in good repair. Lungs:  Normal respiratory effort, chest expands symmetrically. Lungs are clear to auscultation, no crackles or wheezes. Cervical Nodes:  R posterior LA , non tender Axillary Nodes:  No palpable lymphadenopathy   Impression & Recommendations:  Problem # 1:  BRONCHITIS-ACUTE (ICD-466.0)  Her updated medication list for this problem includes:    Amoxicillin 500 Mg Caps (Amoxicillin) .Marland Kitchen... 1 three times a day ; may affect bcp    Hydromet 5-1.5 Mg/34ml Syrp (Hydrocodone-homatropine) .Marland Kitchen... 1 tsp every 6 hrs as needed for cough  Problem # 2:  URI (ICD-465.9)  Her updated  medication list for this problem includes:    Zyrtec Allergy 10 Mg Tabs (Cetirizine hcl) .Marland Kitchen... 1 by mouth once daily    Hydromet 5-1.5 Mg/51ml Syrp (Hydrocodone-homatropine) .Marland Kitchen... 1 tsp every 6 hrs as needed for cough  Complete Medication List: 1)  Ortho Tri-cyclen Lo 0.025 Mg Tabs (Norgestimate-ethinyl estradiol) .... As directed 2)  Astepro 0.15 % Soln (Azelastine hcl) .... 2 sprays each nostril once daily 3)  Zyrtec Allergy 10 Mg Tabs (Cetirizine hcl) .Marland Kitchen.. 1 by mouth once daily 4)  Veramyst 27.5 Mcg/spray Susp (Fluticasone furoate) .... 2  sprays each nostril once daily 5)  Calcium Carbonate 600 Mg Tabs (Calcium carbonate) .Marland Kitchen.. 1 by mouth two times a day 6)  Magnesium 250 Mg Tabs (Magnesium) .Marland Kitchen.. 1 by mouth once daily 7)  Vitamin D3 2000 Unit Tabs (Cholecalciferol) .... Take 1 tab once daily 8)  Fish Oil 1000 Mg Caps (Omega-3 fatty acids) .Marland Kitchen.. 1 by mouth two times a day 9)  Osteo Bi-flex Adv Joint Shield Tabs (Misc natural products) .Marland Kitchen.. 1 by mouth two times a day 10)  Bee Pollen/royal Jelly/honey 1000 Mg Tabs (Nutritional supplements) .Marland Kitchen.. 1 by mouth once daily 11)  Hard Nails 2.5 Mg Caps (Biotin) .Marland Kitchen.. 1 by mouth once daily 12)  Rogaine Mens Extra Strength 5 % Foam (Minoxidil) .... As directed 13)  Amoxicillin 500 Mg Caps (Amoxicillin) .Marland Kitchen.. 1 three times a day ; may affect bcp 14)  Hydromet 5-1.5 Mg/27ml Syrp (Hydrocodone-homatropine) .Marland Kitchen.. 1 tsp every 6 hrs as needed for cough  Patient Instructions: 1)  Neti pot once daily as needed for head congestion. 2)  Drink as much NON dairy  fluid as you can tolerate for the next few days. Prescriptions: HYDROMET 5-1.5 MG/5ML SYRP (HYDROCODONE-HOMATROPINE) 1 tsp every 6 hrs as needed for cough  #120cc x 0   Entered and Authorized by:   Marga Melnick MD   Signed by:   Marga Melnick MD on 03/25/2010   Method used:   Print then Give to Patient   RxID:   [id number] AMOXICILLIN 500 MG CAPS (AMOXICILLIN) 1 three times a day ; may affect BCP  #30 x 0   Entered and Authorized by:   Marga Melnick MD   Signed by:   Marga Melnick MD on 03/25/2010   Method used:   Print then Give to Patient   RxID:   [id number]    Orders Added: 1)  Est. Patient Level III [64403]

## 2010-04-30 NOTE — Progress Notes (Signed)
Summary: unable to tolerate med  Phone Note Refill Request Call back at Home Phone [phone]   Refills Requested: Medication #1:  BIAXIN XL 500 MG XR24H-TAB 2 by mouth once daily Pt c/o cramping/sharp pain in abdominal when she take med. Pt notes that she is taking med with food not on empty stomach and denies any nausea, vomiting or diarrhea. Pt uses [facility]. Pls advise.........Marland KitchenFelecia Deloach CMA  April 13, 2010 11:36 AM    Follow-up for Phone Call        zithromax  #1 pack  as directed ---1 refills Follow-up by: Loreen Freud DO,  April 13, 2010 12:39 PM    New/Updated Medications: ZITHROMAX TRI-PAK 500 MG TABS (AZITHROMYCIN) take by mouth as directed Prescriptions: ZITHROMAX TRI-PAK 500 MG TABS (AZITHROMYCIN) take by mouth as directed  #1 x 0   Entered by:   Almeta Monas CMA (AAMA)   Authorized by:   Loreen Freud DO   Signed by:   Almeta Monas CMA (AAMA) on 04/13/2010   Method used:   Faxed to ...       [facility]  [address] [id number]* (retail)       [address]       [place], [place]  [address]       Ph: [phone] or [phone]       Fax: [phone]   RxID:   [id number]   Appended Document: unable to tolerate med pt advised to atop biaxin and start the Z-pak, she voiced understanding

## 2010-04-30 NOTE — Progress Notes (Signed)
Summary: 1-19-cough  Phone Note Call from Patient Call back at [phone]   Caller: Patient Summary of Call: Pt states that cough is still no better and would like to have a Rx. Pt is currently on flight and will not be able to answer phone until after 7 pm today. Pls advise...........Marland KitchenFelecia Deloach CMA  April 16, 2010 4:13 PM   Follow-up for Phone Call        avelox 400 mg 1 by mouth once daily for 5 days  ov if no better after that  Follow-up by: Loreen Freud DO,  April 16, 2010 4:48 PM  Additional Follow-up for Phone Call Additional follow up Details #1::        Left message to call office to confirm pharmacy.........Marland KitchenFelecia Deloach CMA  April 16, 2010 4:55 PM     Additional Follow-up for Phone Call Additional follow up Details #2::    declined ABX stated this would be her fourth one, stated she will just keep her appt today.... No meds called in. Follow-up by: Almeta Monas CMA (AAMA),  April 17, 2010 10:15 AM

## 2010-04-30 NOTE — Progress Notes (Signed)
Summary: Refill on Cough Syrup  Phone Note Call from Patient Call back at Home Phone [phone]   Caller: Patient Reason for Call: Refill Medication Summary of Call: Patient called this morning stating that she saw Dr. Alwyn Ren last week and he prescribed her an antibiotic and cough syrup. She is going to finish the antibiotic but is completly out of the cough syrup. She was more then willing to come in for an appt but your schedule was booked for today. She is still coughing and is still congested. Would you be willing to refill the cough syrup or does she need to come in for appt with Dr. Alwyn Ren. Please advise.  Initial call taken by: Harold Barban,  April 03, 2010 8:41 AM  Follow-up for Phone Call        refilled cough med but she will need to come in if no better after that.   Follow-up by: Loreen Freud DO,  April 03, 2010 9:20 AM  Additional Follow-up for Phone Call Additional follow up Details #1::        pt aware of the above, rx faxed...Marland KitchenMarland KitchenMarland Kitchen Additional Follow-up by: Almeta Monas CMA (AAMA),  April 03, 2010 10:02 AM    Prescriptions: HYDROMET 5-1.5 MG/5ML SYRP (HYDROCODONE-HOMATROPINE) 1 tsp every 6 hrs as needed for cough  #120cc x 0   Entered and Authorized by:   Loreen Freud DO   Signed by:   Loreen Freud DO on 04/03/2010   Method used:   Printed then faxed to ...       [facility]  [address] [id number]* (retail)       [address]       [place], [place]  [address]       Ph: [phone] or [phone]       Fax: [phone]   RxID:   [id number]

## 2010-04-30 NOTE — Assessment & Plan Note (Signed)
Summary: cough//lch   Vital Signs:  Patient profile:   40 year old female Weight:      152.2 pounds Temp:     98.4 degrees F oral BP sitting:   102 / 60  (left arm) Cuff size:   regular  Vitals Entered By: Almeta Monas CMA Duncan Dull) (April 10, 2010 11:20 AM) CC: x3weeks c/o post nasal drip, cough and congestion, Cough   History of Present Illness:  Cough      This is a 40 year old woman who presents with Cough.  The symptoms began 3 weeks ago.  Pt has used ziacam, mucinex, delsym, dayquil with no relief.  She then saw Dr Alwyn Ren who gave her abx and cough syrup but it has still not resolved.  The patient reports productive cough, exertional dyspnea, and fever, but denies non-productive cough, pleuritic chest pain, shortness of breath, wheezing, and hemoptysis.  The patient denies the following symptoms: cold/URI symptoms, sore throat, nasal congestion, chronic rhinitis, weight loss, acid reflux symptoms, and peripheral edema.  The cough is worse with activity and lying down.  Ineffective prior treatments have included OTC cough medication.    Current Medications (verified): 1)  Ortho Tri-Cyclen Lo 0.025 Mg Tabs (Norgestimate-Ethinyl Estradiol) .... As Directed 2)  Astepro 0.15 % Soln (Azelastine Hcl) .... 2 Sprays Each Nostril Once Daily 3)  Zyrtec Allergy 10 Mg Tabs (Cetirizine Hcl) .Marland Kitchen.. 1 By Mouth Once Daily 4)  Veramyst 27.5 Mcg/spray Susp (Fluticasone Furoate) .... 2 Sprays Each Nostril Once Daily 5)  Calcium Carbonate 600 Mg Tabs (Calcium Carbonate) .Marland Kitchen.. 1 By Mouth Two Times A Day 6)  Magnesium 250 Mg Tabs (Magnesium) .Marland Kitchen.. 1 By Mouth Once Daily 7)  Vitamin D3 2000 Unit Tabs (Cholecalciferol) .... Take 1 Tab Once Daily 8)  Fish Oil 1000 Mg Caps (Omega-3 Fatty Acids) .Marland Kitchen.. 1 By Mouth Two Times A Day 9)  Osteo Bi-Flex Adv Joint Shield  Tabs (Misc Natural Products) .Marland Kitchen.. 1 By Mouth Two Times A Day 10)  Bee Pollen/royal Jelly/honey 1000 Mg Tabs (Nutritional Supplements) .Marland Kitchen.. 1 By Mouth  Once Daily 11)  Hard Nails 2.5 Mg Caps (Biotin) .Marland Kitchen.. 1 By Mouth Once Daily 12)  Rogaine Mens Extra Strength 5 % Foam (Minoxidil) .... As Directed 13)  Biaxin Xl 500 Mg Xr24h-Tab (Clarithromycin) .... 2 By Mouth Once Daily 14)  Cheratussin Ac 100-10 Mg/22ml Syrp (Guaifenesin-Codeine) .Marland Kitchen.. 1-2 Tsp By Mouth At Bedtime  Allergies (verified): 1)  ! 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 Family History Diabetes 1st degree relative--F Family History High cholesterol-- M Family History Hypertension-- M Family History Breast cancer 1st degree relative <50---S Family History Osteoporosis  Social History: Reviewed history from 03/03/2007 and no changes required. 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  Otoscopic examination reveals clear canals, tympanic membranes are intact  bilaterally without bulging, retraction, inflammation or discharge. Hearing is grossly normal bilaterally. Nose:  External nasal examination shows no deformity or inflammation. Nasal mucosa are pink and moist without lesions or exudates. Mouth:  Oral mucosa and oropharynx without lesions or exudates.  Teeth in good repair. Neck:  No deformities, masses, or tenderness noted. Lungs:  normal respiratory effort, no intercostal retractions, no accessory muscle use, R decreased breath sounds, and L decreased breath sounds.   Heart:  normal rate and no murmur.   Psych:  Oriented X3 and normally interactive.     Impression & Recommendations:  Problem # 1:  BRONCHITIS-ACUTE (ICD-466.0)  Her updated medication list for this problem includes:    Biaxin Xl 500 Mg Xr24h-tab (Clarithromycin) .Marland Kitchen... 2 by mouth once daily    Cheratussin Ac 100-10 Mg/31ml Syrp (Guaifenesin-codeine) .Marland Kitchen... 1-2 tsp by mouth at bedtime  Orders: T-2 View CXR (71020TC)  Take antibiotics and other medications as directed. Encouraged to push clear liquids, get enough rest, and take acetaminophen as needed. To be seen in 5-7 days if no improvement, sooner if worse.  Complete Medication List: 1)  Ortho Tri-cyclen Lo 0.025 Mg Tabs (Norgestimate-ethinyl estradiol) .... As directed 2)  Astepro 0.15 % Soln (Azelastine hcl) .... 2 sprays each nostril once daily 3)  Zyrtec Allergy 10 Mg Tabs (Cetirizine hcl) .Marland Kitchen.. 1 by mouth once daily 4)  Veramyst 27.5 Mcg/spray Susp (Fluticasone furoate) .... 2 sprays each nostril once daily 5)  Calcium Carbonate 600 Mg Tabs (Calcium carbonate) .Marland Kitchen.. 1 by mouth two times a day 6)  Magnesium 250 Mg Tabs (Magnesium) .Marland Kitchen.. 1 by mouth once daily 7)  Vitamin D3 2000 Unit Tabs (Cholecalciferol) .... Take 1 tab once daily 8)  Fish Oil 1000 Mg Caps (Omega-3 fatty acids) .Marland Kitchen.. 1 by mouth two times a day 9)  Osteo Bi-flex Adv Joint Shield Tabs (Misc natural products) .Marland Kitchen.. 1 by mouth two times a day 10)  Bee  Pollen/royal Jelly/honey 1000 Mg Tabs (Nutritional supplements) .Marland Kitchen.. 1 by mouth once daily 11)  Hard Nails 2.5 Mg Caps (Biotin) .Marland Kitchen.. 1 by mouth once daily 12)  Rogaine Mens Extra Strength 5 % Foam (Minoxidil) .... As directed 13)  Biaxin Xl 500 Mg Xr24h-tab (Clarithromycin) .... 2 by mouth once daily 14)  Cheratussin Ac 100-10 Mg/59ml Syrp (Guaifenesin-codeine) .Marland Kitchen.. 1-2 tsp by mouth at bedtime Prescriptions: CHERATUSSIN AC 100-10 MG/5ML SYRP (GUAIFENESIN-CODEINE) 1-2 tsp by mouth at bedtime  #6 oz x 0   Entered and Authorized by:   Loreen Freud DO   Signed by:   Loreen Freud DO on 04/10/2010   Method used:   Print then Give to Patient   RxID:   [id number] BIAXIN XL 500 MG XR24H-TAB (CLARITHROMYCIN) 2 by mouth once daily  #14 x 0   Entered and Authorized by:   Loreen Freud DO   Signed by:   Loreen Freud DO on 04/10/2010   Method used:   Electronically to        [id number] (retail)       [address]       [place], [place]  [address]       Ph: [phone] or [phone]       Fax: [phone]   RxID:  [id number]    Orders Added: 1)  T-2 View CXR [71020TC] 2)  Est. Patient Level III [[address]]

## 2010-05-27 ENCOUNTER — Telehealth (INDEPENDENT_AMBULATORY_CARE_PROVIDER_SITE_OTHER): Payer: Self-pay | Admitting: *Deleted

## 2010-05-29 ENCOUNTER — Institutional Professional Consult (permissible substitution) (INDEPENDENT_AMBULATORY_CARE_PROVIDER_SITE_OTHER): Payer: 59 | Admitting: Internal Medicine

## 2010-05-29 ENCOUNTER — Encounter: Payer: Self-pay | Admitting: Internal Medicine

## 2010-05-29 ENCOUNTER — Telehealth: Payer: Self-pay | Admitting: Family Medicine

## 2010-05-29 ENCOUNTER — Institutional Professional Consult (permissible substitution): Payer: Self-pay | Admitting: Internal Medicine

## 2010-05-29 ENCOUNTER — Telehealth: Payer: Self-pay | Admitting: Internal Medicine

## 2010-05-29 DIAGNOSIS — R059 Cough, unspecified: Secondary | ICD-10-CM

## 2010-05-29 DIAGNOSIS — R05 Cough: Secondary | ICD-10-CM

## 2010-06-01 ENCOUNTER — Telehealth: Payer: Self-pay | Admitting: Family Medicine

## 2010-06-01 ENCOUNTER — Telehealth (INDEPENDENT_AMBULATORY_CARE_PROVIDER_SITE_OTHER): Payer: Self-pay | Admitting: *Deleted

## 2010-06-03 ENCOUNTER — Telehealth: Payer: Self-pay | Admitting: Family Medicine

## 2010-06-03 ENCOUNTER — Telehealth (INDEPENDENT_AMBULATORY_CARE_PROVIDER_SITE_OTHER): Payer: Self-pay | Admitting: *Deleted

## 2010-06-04 NOTE — Progress Notes (Signed)
Summary: cough no better  Phone Note Refill Request Call back at Home Phone [phone]   Refills Requested: Medication #1:  TESSALON 200 MG CAPS 1 by mouth three times a day Pt still c/o dry cough with a little congestion. Pt had OV with Pulmonary today but left after waiting a hour for appt and still not being able to see physician. Pt states that she was very upset at the fact she had to wait a month to see pulmonary and then they kept rescheduling her appt. Pt does not want to come in for OV but would like a refill of med.Marland KitchenPls advise ok to fill med............Marland KitchenFelecia Deloach CMA  May 29, 2010 2:23 PM  pt uses [facility] [address]   Follow-up for Phone Call        ok to refill x1  but she really needs to see them if she is still coughing. Follow-up by: Loreen Freud DO,  May 29, 2010 2:48 PM    Prescriptions: TESSALON 200 MG CAPS (BENZONATATE) 1 by mouth three times a day  #30 x 0   Entered by:   Doristine Devoid CMA   Authorized by:   Loreen Freud DO   Signed by:   Doristine Devoid CMA on 05/29/2010   Method used:   Electronically to        [id number] (retail)       [address]       [place], [place]  [address]       Ph: [phone] or [phone]       Fax: [phone]   RxID:   [id number]

## 2010-06-04 NOTE — Progress Notes (Signed)
Summary: returning call  Phone Note Call from Patient Call back at Home Phone [phone]   Caller: Shelley Norman Call For: Shelley Norman Summary of Call: Returning Shelley Norman's call. Initial call taken by: Darletta Moll,  May 27, 2010 11:19 AM  Follow-up for Phone Call        Spoke with pts mom-pt is out of town and mom is unable to get in touch with patient at this time about her appt being moved from Friday at 9am to Friday at 11am because CDY will not be in office until 930am. Mom is aware to advise pt of new time and pt to call Friday morning if she needs to [facility].[name] CMA  May 27, 2010 11:47 AM

## 2010-06-05 ENCOUNTER — Encounter: Payer: Self-pay | Admitting: Internal Medicine

## 2010-06-05 ENCOUNTER — Ambulatory Visit (INDEPENDENT_AMBULATORY_CARE_PROVIDER_SITE_OTHER)
Admission: RE | Admit: 2010-06-05 | Discharge: 2010-06-05 | Disposition: A | Discharge status: Home / Self Care | Payer: 59 | Source: Ambulatory Visit | Attending: Internal Medicine | Admitting: Internal Medicine

## 2010-06-05 ENCOUNTER — Encounter (INDEPENDENT_AMBULATORY_CARE_PROVIDER_SITE_OTHER): Payer: Self-pay | Admitting: *Deleted

## 2010-06-05 ENCOUNTER — Other Ambulatory Visit: Payer: Self-pay | Admitting: Internal Medicine

## 2010-06-05 ENCOUNTER — Other Ambulatory Visit: Payer: 59

## 2010-06-05 ENCOUNTER — Institutional Professional Consult (permissible substitution) (INDEPENDENT_AMBULATORY_CARE_PROVIDER_SITE_OTHER): Payer: 59 | Admitting: Internal Medicine

## 2010-06-05 DIAGNOSIS — R05 Cough: Secondary | ICD-10-CM

## 2010-06-05 DIAGNOSIS — J301 Allergic rhinitis due to pollen: Secondary | ICD-10-CM

## 2010-06-05 LAB — SEDIMENTATION RATE: Sed Rate: 12 mm/hr (ref 0–22)

## 2010-06-05 LAB — CBC WITH DIFFERENTIAL/PLATELET
Basophils Absolute: 0 10*3/uL (ref 0.0–0.1)
Basophils Relative: 0.4 % (ref 0.0–3.0)
Eosinophils Relative: 2.1 % (ref 0.0–5.0)
Lymphocytes Relative: 49.6 % — ABNORMAL HIGH (ref 12.0–46.0)
MCHC: 34.9 g/dL (ref 30.0–36.0)
MCV: 91.7 fl (ref 78.0–100.0)
Neutro Abs: 1.4 10*3/uL (ref 1.4–7.7)
RDW: 13.1 % (ref 11.5–14.6)
WBC: 3.2 10*3/uL — ABNORMAL LOW (ref 4.5–10.5)

## 2010-06-09 NOTE — Assessment & Plan Note (Signed)
Summary: chonic cough/lowne/kcw   Pt was not seen; wait in the office and left without being seen.Shelley Norman     Allergies: 1)  ! Tetracycline   Other Orders: No Charge Patient Arrived (NCPA0) (NCPA0)

## 2010-06-09 NOTE — Progress Notes (Signed)
Summary: refer  Phone Note Call from Patient Call back at Home Phone [phone]   Caller: Patient Summary of Call: Pt would like to be referred to another pulmonologist/allergist other than current( dr young). Pls advise............Marland KitchenFelecia Deloach CMA  June 01, 2010 4:08 PM   Follow-up for Phone Call        will send new referral to [facility] Follow-up by: Loreen Freud DO,  June 01, 2010 4:43 PM  Additional Follow-up for Phone Call Additional follow up Details #1::        pt aware... Additional Follow-up by: Almeta Monas CMA Duncan Dull),  June 01, 2010 4:46 PM

## 2010-06-09 NOTE — Progress Notes (Signed)
Summary: says she also should have referral to allergist  Phone Note Call from Patient   Caller: Patient Summary of Call: patient called--I gave her info about Dr Lottie Rater;  she said she also should have referral for allergist---I said I would send you message about allergist Initial call taken by: Jerolyn Shin,  June 03, 2010 12:41 PM  Follow-up for Phone Call        pt also wants a referal to an allergist..please advise Follow-up by: Almeta Monas CMA Duncan Dull),  June 03, 2010 1:43 PM  Additional Follow-up for Phone Call Additional follow up Details #1::        I would like to do one at a time---Dr Maple Hudson was both Dr Lottie Rater may be also---I'll ask renee to check. Additional Follow-up by: Loreen Freud DO,  June 03, 2010 2:18 PM    Additional Follow-up for Phone Call Additional follow up Details #2::    Call from [facility] and she stated patient will see Dr.Young  on friday 3/9.12 and we can cancel the appt with Dr.Blaylock. Follow-up by: Almeta Monas CMA Duncan Dull),  June 03, 2010 2:45 PM  Additional Follow-up for Phone Call Additional follow up Details #3:: Details for Additional Follow-up Action Taken: great!  thanks! Additional Follow-up by: Loreen Freud DO,  June 03, 2010 2:49 PM

## 2010-06-09 NOTE — Progress Notes (Signed)
Summary: patient stated that she was returning a call to [facility]  Phone Note Call from Patient Call back at [facility] Phone [phone]   Caller: Patient Call For: YOUNG Summary of Call: Patient phoned stated that she was returning a call to [facility]. She can be reached at [phone] until about 2:00. Initial call taken by: Vedia Coffer,  June 03, 2010 1:11 PM  Follow-up for Phone Call        Florentina Addison, did you call this pt?   Crystal Jones RN  June 03, 2010 2:30 PM   I called the patient and spoke with her about the misunderstanding the day of her appt and rescheduled her to Friday 06-05-10 at 9am with CDY to be the first patient of the day. I have contacted Dr. Hulan Saas office to inform them of the appt and have them cancel the appt with Dr. Lottie Rater in [facility]. I spoke with Arman Bogus at [facility] and she is aware of appt and is cancelling appt with Dr. Johnney Ou aware the he is a P.A. not an allergy dr.Katie Welchel CMA  June 03, 2010 2:45 PM

## 2010-06-09 NOTE — Progress Notes (Signed)
Summary: Patient complaint  Phone Note Call from Patient   Caller: Patient Summary of Call: Patient called back today to "see how you are going to solve this problem."  She reiterated her experience and complained again about having to wait to be seen.  I apologized and offered to reschedule another appointment with her.  She demanded that I guarantee that she would not have to wait to be seen, and I told her that I could not make that promise since I cannot anticipate what circumstances might arise.  Patient repeated that this was not acceptable and hung up. Initial call taken by: Leonette Monarch,  June 01, 2010 3:06 PM

## 2010-06-09 NOTE — Progress Notes (Signed)
Summary: VM from patient  Phone Note Call from Patient   Caller: Patient Summary of Call: I received this voicemail on my phone this afternoon.  "I was there today to see Dr. Maple Hudson, had a referral from my primary physician, and sat in the lobby for an hour and 15 minutes.  And this was after I received a message saying that my appointment had been pushed back a half hour because he wouldn't be there until 11.  I thought that that was just completely ridiculous and unprofessional that I sit there that long and still did not get a chance to see him.  Of course, this is nothing against the front desk staff, because they were very accommodating and very gracious and tried to contact the nurse to see what could be done to speed things up and pretty much came back with the nurse telling them that the best they could do was put me in a room and didn't know how long I would have to wait.  I just thought that was completely unacceptable.  Again, this was an appointment that it took me over a month to get.  So I want to file a complaint.  I want to see what can be done, because if I had shown up an hour and 15 minutes late, either I wouldn't have been seen or I would have been charged $50 or both.  So I just feel like there should be some sort of repercussion when a client or patient has to sit there and wait that long.  My phone number is [phone].  I would appreciate if you would give me a call back today."  I did not retrieve my messages until 5:30 and have informed Dr. Maple Hudson regarding the content of this one. Initial call taken by: Leonette Monarch,  May 29, 2010 5:45 PM  Follow-up for Phone Call        noted Follow-up by: Waymon Budge MD,  May 31, 2010 7:29 PM

## 2010-06-12 ENCOUNTER — Telehealth (INDEPENDENT_AMBULATORY_CARE_PROVIDER_SITE_OTHER): Payer: Self-pay | Admitting: *Deleted

## 2010-06-16 NOTE — Progress Notes (Signed)
Summary: returning a call from [facility]  Phone Note Call from Patient Call back at [facility] Phone [phone]   Caller: Patient Call For: YOUNG Summary of Call: Patient phoned stated that she was returning a call from [place]. She can be reached at [phone] Initial call taken by: Vedia Coffer,  June 12, 2010 2:27 PM  Follow-up for Phone Call        Pt aware of results.[name] CMA  June 12, 2010 5:15 PM

## 2010-06-16 NOTE — Assessment & Plan Note (Signed)
Summary: chronic cough/pt to be here at 845am/kcw   Primary Provider/Referring Provider:  Laury Axon  CC:  Allergy consult-Dr. Laury Axon..  History of Present Illness: June 05, 2010- 40 yoF referred courtesy of Dr Laury Axon, concerned about cough and nasal congestion. This is a new pattern for her, starting in December, 2011 and strongly associated with work related environment. 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 Begins Monday or Tuesday each week, gets worse through work week, till she's been back here a day on weekends. Rescue inhaler and tessaon do help some. Describes deep hacking cough, mostly dry, scant clear to yellow, maybe a little wheeze, nasal congestion and sneeze. Denies fever, nodes, rash, joint pains. May get hoarse. Much postnasal drip. Denies reflux/ heartburn. Has used Astepro at night with Zyrtec regularly, then Nasonex each morning. Has Qvar 40- not used since February. Told an office spirometry good at Dr Ernst Spell 04/10/10. Had a sustained bronchits in 2005- sounds like infection- did completely resolve. Denies hx asthma or allergy except mowing grass congests nose- prevented by wearing mask. Work building seems clean with no obvious industrial or mold related exposure apparent.  A colleage was having a similar problem but got reassigned.   Preventive Screening-Counseling & Management  Alcohol-Tobacco     Alcohol drinks/day: <1     Alcohol type: wine, brandy     Smoking Status: never     Passive Smoke Exposure: no  Current Medications (verified): 1)  Ortho Tri-Cyclen Lo 0.025 Mg Tabs (Norgestimate-Ethinyl Estradiol) .... As Directed 2)  Astepro 0.15 % Soln (Azelastine Hcl) .... 2 Sprays Each Nostril Once Daily 3)  Zyrtec Allergy 10 Mg Tabs (Cetirizine Hcl) .Marland Kitchen.. 1 By Mouth Once Daily 4)  Veramyst 27.5 Mcg/spray Susp (Fluticasone Furoate) .... 2 Sprays Each  Nostril Once Daily 5)  Calcium Carbonate 600 Mg Tabs (Calcium Carbonate) .Marland Kitchen.. 1 By Mouth Two Times A Day 6)  Magnesium 250 Mg Tabs (Magnesium) .Marland Kitchen.. 1 By Mouth Once Daily 7)  Vitamin D3 2000 Unit Tabs (Cholecalciferol) .... Take 1 Tab Once Daily 8)  Fish Oil 1000 Mg Caps (Omega-3 Fatty Acids) .Marland Kitchen.. 1 By Mouth Two Times A Day 9)  Osteo Bi-Flex Adv Joint Shield  Tabs (Misc Natural Products) .Marland Kitchen.. 1 By Mouth Two Times A Day 10)  Bee Pollen/royal Jelly/honey 1000 Mg Tabs (Nutritional Supplements) .Marland Kitchen.. 1 By Mouth Once Daily 11)  Hard Nails 2.5 Mg Caps (Biotin) .Marland Kitchen.. 1 By Mouth Once Daily 12)  Tessalon 200 Mg Caps (Benzonatate) .Marland Kitchen.. 1 By Mouth Three Times A Day 13)  Qvar 40 Mcg/act Aers (Beclomethasone Dipropionate) .... 2 Puffs Two Times A Day 14)  Nasonex 50 Mcg/act Susp (Mometasone Furoate) .... 2 Sprays Each Nostril Once Daily  Allergies (verified): 1)  ! 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 [facility]- 2012 Single, lives here w/ mother Never Smoked Alcohol use-yes Drug use-no Regular exercise-yes  Risk Factors: Alcohol Use: <1 (06/05/2010) Caffeine Use: 0 (10/08/2009) Exercise: yes (10/08/2009)  Risk Factors: Smoking Status: never (06/05/2010) Passive Smoke Exposure: no (06/05/2010)  Past Medical History: Unremarkable Osteoarthritis vita d def bronchitis rhinitis  Family History: Family History Diabetes 1st degree relative--F Family History  High cholesterol-- M Family History Hypertension-- M Family History Breast cancer 1st degree relative <50---S Family History Osteoporosis Parents living Aunt died asthma  Social History: Occupation:  Orthoptist- subsidiary of [employer]                                                                  Group Commuting each week to [employer]- 2012 Single, lives here w/ mother Never Smoked Alcohol use-yes Drug use-no Regular exercise-yes  Review of Systems      See HPI       The patient complains of non-productive cough, sore throat, headaches, nasal congestion/difficulty breathing through nose, sneezing, joint stiffness or pain, and change in color of mucus.  The patient denies shortness of breath with activity, shortness of breath at rest, productive cough, coughing up blood, chest pain, irregular heartbeats, acid heartburn, indigestion, loss of appetite, weight change, abdominal pain, difficulty swallowing, tooth/dental problems, itching, ear ache, anxiety, depression, hand/feet swelling, rash, and fever.    Vital Signs:  Patient profile:   40 year old female Height:      62 inches Weight:      152.25 pounds BMI:     27.95 O2 Sat:      99 % on Room air Pulse rate:   82 / minute BP sitting:   124 / 82  (right arm) Cuff size:   regular  Vitals Entered By: [name] CMA (June 05, 2010 9:01 AM)  O2 Flow:  Room air CC: Allergy consult-Dr. Laury Axon.   Physical Exam  Additional Exam:  General: A/Ox3; pleasant and cooperative, NAD, wdwn SKIN: no rash, lesions NODES: no lymphadenopathy HEENT: [place]/AT, EOM- WNL, Conjuctivae- clear, PERRLA, TM-WNL, Nose- clear, Throat- clear and wnl, tonsils, Mallampati  II NECK: Supple w/ fair ROM, JVD- none, normal carotid impulses w/o bruits Thyroid- normal to palpation CHEST:corse rhonchi RUL, unlabored HEART: RRR, no m/g/r heard ABDOMEN: Soft and nl; nml bowel sounds; no organomegaly or masses noted EAV:WUJW, nl pulses, no edema    NEURO: Grossly intact to observation      Impression & Recommendations:  Problem # 1:  COUGH, CHRONIC (ICD-786.2)  Pattern strongly suggests an inflammatory- not necessarily allergic- response to something in the work place. Occupational bronchitis.  We will increase her anitiiflammatory coverage and do an IgE assessment. I made the point that avidance is best and she should discuss reassignment options with her employer. Meanwhile a small HEPA aircleaner might be practical at her cubicle. There was enough adventious noise in RUL to warrant CXR.  Problem # 2:  ALLERGIC RHINITIS DUE TO POLLEN (ICD-477.0)  There is a mild hx of seasonal rhinitis- mainly with grass mowing. Mostly the weekly nasal congestion seems part of the airway response she is describing. We will increase her treatment for upper and lower airway symptoms together.   Medications Added to Medication List  This Visit: 1)  Fexofenadine Hcl 180 Mg Tabs (Fexofenadine hcl) 2)  Qvar 80 Mcg/act Aers (Beclomethasone dipropionate) .... 2 puffs and rinse mouth, twice daily 3)  Nasonex 50 Mcg/act Susp (Mometasone furoate) .... 2 sprays each nostril once daily at bedtime 4)  Singulair 10 Mg Tabs (Montelukast sodium) .Marland Kitchen.. 1 daily  Other Orders: Consultation Level III (91478) TLB-CBC Platelet - w/Differential (85025-CBCD) T-Allergy Profile Region II-DC, [place], [place], [place], [place] (5484) TLB-Sedimentation Rate (ESR) (85652-ESR) T-2 View CXR (71020TC)  Patient Instructions: 1)  Please schedule a follow-up appointment in 1 month. Please call sooner as needed.  2)  Script for Singulair- 1 every day  3)  Sample and script Qvar 80 4)      2 puffs and rinse mouth well, twice every day 5)  Script to continue Nasonex 6)     2 puffs each nostril every day at bedtime 7)  Try generic Allegra- fexofenadine-D/ 180, sign at counter, as antihistamine as needed 8)  Lab 9)  A chest x-ray has been recommended.  Your imaging study may require  preauthorization.  10)  cc Dr Laury Axon Prescriptions: SINGULAIR 10 MG TABS (MONTELUKAST SODIUM) 1 daily  #30 x prn   Entered and Authorized by:   Waymon Budge MD   Signed by:   Waymon Budge MD on 06/05/2010   Method used:   Print then Give to Patient   RxID:   [id number] NASONEX 50 MCG/ACT SUSP (MOMETASONE FUROATE) 2 sprays each nostril once daily at bedtime  #1 x prn   Entered and Authorized by:   Waymon Budge MD   Signed by:   Waymon Budge MD on 06/05/2010   Method used:   Print then Give to Patient   RxID:   [id number] QVAR 80 MCG/ACT AERS (BECLOMETHASONE DIPROPIONATE) 2 puffs and rinse mouth, twice daily  #1 x prn   Entered and Authorized by:   Waymon Budge MD   Signed by:   Waymon Budge MD on 06/05/2010   Method used:   Print then Give to Patient   RxID:   [id number] ASTEPRO 0.15 % SOLN (AZELASTINE HCL) 2 sprays each nostril once daily  #1 x 5   Entered and Authorized by:   Waymon Budge MD   Signed by:   Waymon Budge MD on 06/05/2010   Method used:   Print then Give to Patient   RxID:   [id number] SINGULAIR 10 MG TABS (MONTELUKAST SODIUM) 1 daily  #30 x prn   Entered and Authorized by:   Waymon Budge MD   Signed by:   Waymon Budge MD on 06/05/2010   Method used:   Historical   RxID:   [id number] QVAR 80 MCG/ACT AERS (BECLOMETHASONE DIPROPIONATE) 2 puffs and rinse mouth, twice daily  #1 x prn   Entered and Authorized by:   Waymon Budge MD   Signed by:   Waymon Budge MD on 06/05/2010   Method used:   Historical   RxID:   [id number] NASONEX 50 MCG/ACT SUSP (MOMETASONE FUROATE) 2 sprays each nostril once daily at bedtime  #1 x prn   Entered and Authorized by:   Waymon Budge MD   Signed by:   Waymon Budge MD on 06/05/2010   Method used:   Historical   RxID:   [id number]

## 2010-07-15 ENCOUNTER — Encounter: Payer: Self-pay | Admitting: Internal Medicine

## 2010-07-17 ENCOUNTER — Ambulatory Visit (INDEPENDENT_AMBULATORY_CARE_PROVIDER_SITE_OTHER): Payer: 59 | Admitting: Internal Medicine

## 2010-07-17 ENCOUNTER — Encounter: Payer: Self-pay | Admitting: Internal Medicine

## 2010-07-17 VITALS — BP 106/80 | HR 81 | Ht 62.0 in | Wt 154.0 lb

## 2010-07-17 DIAGNOSIS — R05 Cough: Secondary | ICD-10-CM

## 2010-07-17 DIAGNOSIS — J301 Allergic rhinitis due to pollen: Secondary | ICD-10-CM

## 2010-07-17 MED ORDER — FLUCONAZOLE 150 MG PO TABS: ORAL_TABLET | ORAL | Status: DC

## 2010-07-17 NOTE — Patient Instructions (Signed)
Ok to come off Singulair for a trial at your discretion. You can always restart it.   Script sent for diflucan for thrush/ yeast overgrowth in mouth

## 2010-07-17 NOTE — Assessment & Plan Note (Signed)
Subacute bronchitis now resolved. She is already off Qvar. She can stop Singulair at her discretion and see what happens. We discussed possible seasonality- indoor heat etc. Qvar gave some thrush and we will let her hold a script for diflucan.

## 2010-07-17 NOTE — Progress Notes (Signed)
  Subjective:    Patient ID: Shelley Norman, female    DOB: 06-01-1970, 40 y.o.   MRN: [id number]  HPI 40 yo F never smoker followed for allergic rhinitis and cough. 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 She now  is doing quite well, without drainage, cough, wheeze of respiratory distress.. Chest is ok with much less cough. Qvar may have been causing sores in mouth but otherwise she feels situation is well controlled and satisfactory. She stopped the Qvar 2 weeks ago.    Review of Systems See HPI Constitutional:   No weight loss, night sweats,  Fevers, chills, fatigue, lassitude. HEENT:   No headaches,  Difficulty swallowing,  Tooth/dental problems,  Sore throat,                No sneezing, itching, ear ache, nasal congestion, post nasal drip,   CV:  No chest pain,  Orthopnea, PND, swelling in lower extremities, anasarca, dizziness, palpitations  GI  No heartburn, indigestion, abdominal pain, nausea, vomiting, diarrhea, change in bowel habits, loss of appetite  Resp: No shortness of breath with exertion or at rest.  No excess mucus, no productive cough,  No non-productive cough,  No coughing up of blood.  No change in color of mucus.  No wheezing.  Skin: no rash or lesions.  GU: no dysuria, change in color of urine, no urgency or frequency.  No flank pain.  MS:  No joint pain or swelling.  No decreased range of motion.  No back pain.  Psych:  No change in mood or affect. No depression or anxiety.  No memory loss.   .    Objective:   Physical Exam General- Alert, Oriented, Affect-appropriate, Distress- none acute  Skin- rash-none, lesions- none, excoriation- none  Lymphadenopathy- none  Head- atraumatic  Eyes- Gross vision intact, PERRLA, conjunctivae clear secretions  Ears- Normal- Hearing,  canals, Tm L ,   R ,  Nose- Clear, No-Septal dev, mucus, polyps, erosion, perforation   Throat- Mallampati II , mucosa - minor pink area right inner cheek ,       drainage- none, tonsils- atrophic. Incidental torus  Neck- flexible , trachea midline, no stridor , thyroid nl, carotid no bruit  Chest - symmetrical excursion , unlabored     Heart/CV- RRR , no murmur , no gallop  , no rub, nl s1 s2                     - JVD- none , edema- none, stasis changes- none, varices- none     Lung- clear to P&A, wheeze- none, cough- none , dullness-none, rub- none     Chest wall-   Abd- tender-no, distended-no, bowel sounds-present, HSM- no  Br/ Gen/ Rectal- Not done, not indicated  Extrem- cyanosis- none, clubbing, none, atrophy- none, strength- nl  Neuro- grossly intact to observation         Assessment & Plan:

## 2010-07-19 ENCOUNTER — Encounter: Payer: Self-pay | Admitting: Internal Medicine

## 2010-07-19 NOTE — Assessment & Plan Note (Signed)
She denies problems so far this Spring.

## 2010-09-12 ENCOUNTER — Encounter: Payer: Self-pay | Admitting: Family Medicine

## 2010-10-02 ENCOUNTER — Encounter: Payer: 59 | Admitting: Family Medicine

## 2010-11-27 ENCOUNTER — Encounter: Payer: Self-pay | Admitting: Family Medicine

## 2010-11-27 ENCOUNTER — Ambulatory Visit (INDEPENDENT_AMBULATORY_CARE_PROVIDER_SITE_OTHER): Payer: 59 | Admitting: Family Medicine

## 2010-11-27 VITALS — BP 118/80 | HR 79 | Temp 99.4°F | Ht 61.5 in | Wt 155.6 lb

## 2010-11-27 DIAGNOSIS — Z0000 Encounter for general adult medical examination without abnormal findings: Secondary | ICD-10-CM

## 2010-11-27 LAB — CBC WITH DIFFERENTIAL/PLATELET
Basophils Absolute: 0 10*3/uL (ref 0.0–0.1)
Basophils Relative: 0.4 % (ref 0.0–3.0)
Eosinophils Absolute: 0.1 10*3/uL (ref 0.0–0.7)
Hemoglobin: 12.4 g/dL (ref 12.0–15.0)
Lymphocytes Relative: 44.2 % (ref 12.0–46.0)
MCHC: 33.7 g/dL (ref 30.0–36.0)
MCV: 91.3 fl (ref 78.0–100.0)
Monocytes Absolute: 0.3 10*3/uL (ref 0.1–1.0)
Neutro Abs: 2.1 10*3/uL (ref 1.4–7.7)
Neutrophils Relative %: 47.8 % (ref 43.0–77.0)
RBC: 4.03 Mil/uL (ref 3.87–5.11)
RDW: 12.8 % (ref 11.5–14.6)

## 2010-11-27 LAB — BASIC METABOLIC PANEL
CO2: 28 mEq/L (ref 19–32)
Calcium: 8.9 mg/dL (ref 8.4–10.5)
Chloride: 103 mEq/L (ref 96–112)
Creatinine, Ser: 0.9 mg/dL (ref 0.4–1.2)
Glucose, Bld: 84 mg/dL (ref 70–99)

## 2010-11-27 LAB — HEPATIC FUNCTION PANEL
Albumin: 4 g/dL (ref 3.5–5.2)
Alkaline Phosphatase: 53 U/L (ref 39–117)
Total Protein: 7.8 g/dL (ref 6.0–8.3)

## 2010-11-27 LAB — POCT URINALYSIS DIPSTICK
Bilirubin, UA: NEGATIVE
Blood, UA: NEGATIVE
Ketones, UA: NEGATIVE
Protein, UA: NEGATIVE
Spec Grav, UA: 1.005
pH, UA: 6

## 2010-11-27 LAB — LIPID PANEL
HDL: 80.9 mg/dL (ref >39.00)
Triglycerides: 94 mg/dL (ref 0.0–149.0)
VLDL: 18.8 mg/dL (ref 0.0–40.0)

## 2010-11-27 NOTE — Patient Instructions (Signed)

## 2010-11-27 NOTE — Progress Notes (Signed)
Subjective:     Shelley Norman is a 40 y.o. female and is here for a comprehensive physical exam. The patient reports no problems.  History   Social History  . Marital Status: Single    Spouse Name: N/A    Number of Children: N/A  . Years of Education: N/A   Occupational History  . Consultant    Social History Main Topics  . Smoking status: Never Smoker   . Smokeless tobacco: Not on file  . Alcohol Use: Not on file  . Drug Use: Not on file  . Sexually Active: Not on file   Other Topics Concern  . Not on file   Social History Narrative  . No narrative on file   Health Maintenance  Topic Date Due  . Pap Smear  11/07/2011  . Tetanus/tdap  03/11/2015    The following portions of the patient's history were reviewed and updated as appropriate: allergies, current medications, past family history, past medical history, past social history, past surgical history and problem list.  Review of Systems Review of Systems  Constitutional: Negative for activity change, appetite change and fatigue.  HENT: Negative for hearing loss, congestion, tinnitus and ear discharge.  dentist q28m Eyes: Negative for visual disturbance (see optho q1y -- vision corrected to 20/20 with glasses).  Respiratory: Negative for cough, chest tightness and shortness of breath.   Cardiovascular: Negative for chest pain, palpitations and leg swelling.  Gastrointestinal: Negative for abdominal pain, diarrhea, constipation and abdominal distention.  Genitourinary: Negative for urgency, frequency, decreased urine volume and difficulty urinating.  Musculoskeletal: Negative for back pain, arthralgias and gait problem.  Skin: Negative for color change, pallor and rash.  Neurological: Negative for dizziness, light-headedness, numbness and headaches.  Hematological: Negative for adenopathy. Does not bruise/bleed easily.  Psychiatric/Behavioral: Negative for suicidal ideas, confusion, sleep disturbance, self-injury,  dysphoric mood, decreased concentration and agitation.       Objective:    BP 118/80  Pulse 79  Temp(Src) 99.4 F (37.4 C) (Oral)  Ht 5' 1.5" (1.562 m)  Wt 155 lb 9.6 oz (70.58 kg)  BMI 28.92 kg/m2  SpO2 95%  General Appearance:    Alert, cooperative, no distress, appears stated age  Head:    Normocephalic, without obvious abnormality, atraumatic  Eyes:    PERRL, conjunctiva/corneas clear, EOM's intact, fundi    benign, both eyes  Ears:    Normal TM's and external ear canals, both ears  Nose:   Nares normal, septum midline, mucosa normal, no drainage    or sinus tenderness  Throat:   Lips, mucosa, and tongue normal; teeth and gums normal  Neck:   Supple, symmetrical, trachea midline, no adenopathy;    thyroid:  no enlargement/tenderness/nodules; no carotid   bruit or JVD  Back:     Symmetric, no curvature, ROM normal, no CVA tenderness  Lungs:     Clear to auscultation bilaterally, respirations unlabored  Chest Wall:    No tenderness or deformity   Heart:    Regular rate and rhythm, S1 and S2 normal, no murmur, rub   or gallop  Breast Exam:    gyn  Abdomen:     Soft, non-tender, bowel sounds active all four quadrants,    no masses, no organomegaly  Genitalia:    gyn  Rectal:    gyn  Extremities:   Extremities normal, atraumatic, no cyanosis or edema  Pulses:   2+ and symmetric all extremities  Skin:   Skin color, texture, turgor  normal, no rashes or lesions  Lymph nodes:   Cervical, supraclavicular, and axillary nodes normal  Neurologic:   CNII-XII intact, normal strength, sensation and reflexes    throughout     Assessment:    Healthy female exam.  allergies --per pulm--con't meds  Plan:  check labs  ghm utd See After Visit Summary for Counseling Recommendations

## 2010-12-10 ENCOUNTER — Telehealth: Payer: Self-pay | Admitting: Family Medicine

## 2010-12-10 NOTE — Telephone Encounter (Signed)
Patient called to request lab work for Vitamin D level check (had test 10/08/2009)--didn't have it this year---wants to get this blood test this year---is that OK??  What code do I use??

## 2010-12-10 NOTE — Telephone Encounter (Signed)
268.9  Vita d def

## 2010-12-15 ENCOUNTER — Other Ambulatory Visit: Payer: Self-pay | Admitting: Family Medicine

## 2010-12-15 DIAGNOSIS — E559 Vitamin D deficiency, unspecified: Secondary | ICD-10-CM

## 2010-12-16 ENCOUNTER — Other Ambulatory Visit (INDEPENDENT_AMBULATORY_CARE_PROVIDER_SITE_OTHER): Payer: 59

## 2010-12-16 ENCOUNTER — Ambulatory Visit (INDEPENDENT_AMBULATORY_CARE_PROVIDER_SITE_OTHER): Payer: 59 | Admitting: *Deleted

## 2010-12-16 DIAGNOSIS — E559 Vitamin D deficiency, unspecified: Secondary | ICD-10-CM

## 2010-12-16 DIAGNOSIS — Z0000 Encounter for general adult medical examination without abnormal findings: Secondary | ICD-10-CM

## 2010-12-16 DIAGNOSIS — Z23 Encounter for immunization: Secondary | ICD-10-CM

## 2010-12-16 NOTE — Progress Notes (Signed)
Labs only

## 2010-12-16 NOTE — Progress Notes (Signed)
Addended by: Legrand Como on: 12/16/2010 05:02 PM   Modules accepted: Orders

## 2010-12-17 LAB — VITAMIN D 25 HYDROXY (VIT D DEFICIENCY, FRACTURES): Vit D, 25-Hydroxy: 54 ng/mL (ref 30–89)

## 2010-12-21 ENCOUNTER — Other Ambulatory Visit: Payer: Self-pay | Admitting: *Deleted

## 2010-12-21 MED ORDER — MONTELUKAST SODIUM 10 MG PO TABS: 10.0000 mg | ORAL_TABLET | Freq: Every day | ORAL | Status: DC

## 2010-12-23 NOTE — Telephone Encounter (Signed)
Patient scheduled and had labs drawn on 12-16-10.

## 2010-12-29 ENCOUNTER — Telehealth: Payer: Self-pay | Admitting: Internal Medicine

## 2010-12-29 MED ORDER — MONTELUKAST SODIUM 10 MG PO TABS: 10.0000 mg | ORAL_TABLET | Freq: Every day | ORAL | Status: DC

## 2010-12-29 NOTE — Telephone Encounter (Signed)
Pt returning call.Shelley Norman ° °

## 2010-12-29 NOTE — Telephone Encounter (Signed)
I spoke with pt and she states she needed Singulair sent to [facility] for 90 day supply and not [facility]. I advised pt will send rx. Nothing further was needed

## 2010-12-29 NOTE — Telephone Encounter (Signed)
[email or web address]  

## 2011-02-09 ENCOUNTER — Encounter: Payer: Self-pay | Admitting: Internal Medicine

## 2011-02-09 ENCOUNTER — Ambulatory Visit (INDEPENDENT_AMBULATORY_CARE_PROVIDER_SITE_OTHER): Payer: 59 | Admitting: Internal Medicine

## 2011-02-09 VITALS — BP 130/88 | HR 65 | Ht 62.0 in | Wt 160.0 lb

## 2011-02-09 DIAGNOSIS — R05 Cough: Secondary | ICD-10-CM

## 2011-02-09 DIAGNOSIS — R059 Cough, unspecified: Secondary | ICD-10-CM

## 2011-02-09 DIAGNOSIS — Z23 Encounter for immunization: Secondary | ICD-10-CM

## 2011-02-09 NOTE — Patient Instructions (Signed)
Pneumonia vaccine  Please call as needed 

## 2011-02-09 NOTE — Progress Notes (Signed)
Patient ID: Shelley Norman, female    DOB: 1971/02/07, 40 y.o.   MRN: [id number]  HPI 40 yo F never smoker followed for allergic rhinitis and cough. 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 She now  is doing quite well, without drainage, cough, wheeze of respiratory distress.. Chest is ok with much less cough. Qvar may have been causing sores in mouth but otherwise she feels situation is well controlled and satisfactory. She stopped the Qvar 2 weeks ago.   02/09/11- 40 yo F never smoker followed for allergic rhinitis and cough. Has already had a flu shot. She was able to drop off medications through the summer. [place] at the beach in [place] and has been slow to clear cough. She only got well 2 weeks ago. She took Singulair and used a rescue inhaler but thought that tea with ginger helped the most. 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  Review of Systems See HPI Constitutional:   No-   weight loss, night sweats, fevers, chills, fatigue, lassitude. HEENT:   No-  headaches, difficulty swallowing, tooth/dental problems, sore throat,       No-  sneezing, itching, ear ache, nasal congestion, post nasal drip,  CV:  No-   chest pain, orthopnea, PND, swelling in lower extremities, anasarca,                                  dizziness, palpitations Resp: No-   shortness of breath with exertion or at rest.              No-   productive cough,  No non-productive cough,  No- coughing up of blood.              No-   change in color of mucus.  No- wheezing.   Skin: No-   rash or lesions. GI:  No-   heartburn, indigestion, abdominal pain, nausea, vomiting, diarrhea,                 change in bowel habits, loss of appetite GU: No-   dysuria, change in  color of urine, no urgency or frequency.  No- flank pain. MS:  No-   joint pain or swelling.  No- decreased range of motion.  No- back pain. Neuro-     nothing unusual Psych:  No- change in mood or affect. No depression or anxiety.  No memory loss.      Objective:   Physical Exam General- Alert, Oriented, Affect-appropriate, Distress- none acute Skin- rash-none, lesions- none, excoriation- none Lymphadenopathy- none Head- atraumatic            Eyes- Gross vision intact, PERRLA, conjunctivae clear secretions            Ears- Hearing, canals-normal            Nose- Clear, no-Septal dev, mucus, polyps, erosion, perforation             Throat- Mallampati II , mucosa clear , drainage- none, tonsils- atrophic; torus Neck- flexible , trachea midline, no stridor , thyroid nl, carotid no bruit Chest - symmetrical excursion , unlabored  Heart/CV- RRR , no murmur , no gallop  , no rub, nl s1 s2                           - JVD- none , edema- none, stasis changes- none, varices- none           Lung- clear to P&A, wheeze- none, cough- none , dullness-none, rub- none           Chest wall-  Abd- tender-no, distended-no, bowel sounds-present, HSM- no Br/ Gen/ Rectal- Not done, not indicated Extrem- cyanosis- none, clubbing, none, atrophy- none, strength- nl Neuro- grossly intact to observation

## 2011-02-11 NOTE — Assessment & Plan Note (Signed)
She is well now but with history of sustained cough at times in the past. We agreed there was no testing needed at this time. Plan-Pneumovax

## 2011-02-24 ENCOUNTER — Encounter: Payer: Self-pay | Admitting: *Deleted

## 2011-02-24 ENCOUNTER — Encounter: Payer: 59 | Attending: Obstetrics and Gynecology | Admitting: *Deleted

## 2011-02-24 DIAGNOSIS — E663 Overweight: Secondary | ICD-10-CM | POA: Insufficient documentation

## 2011-02-24 DIAGNOSIS — Z713 Dietary counseling and surveillance: Secondary | ICD-10-CM | POA: Insufficient documentation

## 2011-02-24 NOTE — Progress Notes (Signed)
  Medical Nutrition Therapy:  Appt start time: 0900 end time:  1000.  Assessment:  Primary concerns today: weight management. Pt reports strong family hx for Type 2 DM, HTN, Hypercholesterolemia, and overweight. She notes that she has gained and been unable to lose about 20 lbs over the past 4 years. Due to a knee and foot injury her very active lifestyle has been limited. Per food recall pt consumes increased carbohydrate portions per meal which may be inhibiting her ability to lose any weight.  MEDICATIONS: See updated medication list   DIETARY INTAKE:  Usual eating pattern includes 3 meals and 2 snacks per day.  24-hr recall: Wakes up at 7-8am  B (9:30-10 AM): [place] sausage/bacon OR Egg whites (2), 1/2 cup fruit  Snk (10:30-11 AM): Plain yogurt (6oz), 2 tbsp honey, 1/2 cup fruit  L (1:30 PM): [place] sandwich on whole wheat, Sun chips OR Grilled cheese w/ tomato soup (low sodium) Snk (3-4 PM): Special K crackers (24), peanut butter OR cottage cheese OR Yogurt (w/ honey/fruit) D (8:30 PM): Chicken (grilled or baked), spinach, sweet potato Snk ( PM): n/a Beverages: water, juice (4-6 oz on weekends), SF water,   Usual physical activity: 4-5 hrs/week @ gym (cardio, exercises classes, weight training)  Estimated energy needs: 1300-1600 calories 170 g carbohydrates 80-90 g protein 40-45 g fat  Progress Towards Goal(s):  In progress.   Nutritional Diagnosis:  NI-5.8.2 Excessive carbohydrate intake As related to increased portion sizes of carbohydrates/natural sugars.  As evidenced by pt consuming ~130-150% of estimated carbohydrate recommendations for weight loss.    Intervention:  Nutrition education.  Handouts given during visit include:  Carbohydrate Counting Card  Carbohydrate Snack Sheet  High Fiber Handout  Monitoring/Evaluation:  Dietary intake, carbohydrate intake, exercise, and body weight prn.

## 2011-02-24 NOTE — Patient Instructions (Addendum)
Goals:  Eat 3 meals and 2 snacks, every 3-5 hrs  Following 1500 kcal, 170g carbohydrate, 80g protein recommendations   Count carbohydrates to equal 30-45 grams carbohydrate/meal  Limit carbohydrate intake to 15-20 grams carbohydrate/snack  Add lean protein foods to meals/snacks  Monitor glucose levels as instructed by your doctor  Continue current levels of physical activity daily  Substitute regular honey use to splenda

## 2011-03-01 ENCOUNTER — Other Ambulatory Visit: Payer: Self-pay | Admitting: Obstetrics and Gynecology

## 2011-03-01 DIAGNOSIS — Z1231 Encounter for screening mammogram for malignant neoplasm of breast: Secondary | ICD-10-CM

## 2011-03-29 ENCOUNTER — Ambulatory Visit
Admission: RE | Admit: 2011-03-29 | Discharge: 2011-03-29 | Disposition: A | Discharge status: Home / Self Care | Payer: 59 | Source: Ambulatory Visit | Attending: Obstetrics and Gynecology | Admitting: Obstetrics and Gynecology

## 2011-03-29 DIAGNOSIS — Z1231 Encounter for screening mammogram for malignant neoplasm of breast: Secondary | ICD-10-CM

## 2011-06-11 ENCOUNTER — Encounter (INDEPENDENT_AMBULATORY_CARE_PROVIDER_SITE_OTHER): Payer: 59 | Admitting: Obstetrics and Gynecology

## 2011-06-11 DIAGNOSIS — N644 Mastodynia: Secondary | ICD-10-CM

## 2011-11-30 ENCOUNTER — Encounter: Payer: 59 | Admitting: Family Medicine

## 2011-12-01 ENCOUNTER — Telehealth: Payer: Self-pay

## 2011-12-01 NOTE — Telephone Encounter (Signed)
Faxed Auth for 1 90 day supply of OTC 1 po qd 0 RF to [facility] @ [phone], per protocol. Pt needs AEX in 02/2012. Melody Comas A

## 2011-12-03 ENCOUNTER — Ambulatory Visit (INDEPENDENT_AMBULATORY_CARE_PROVIDER_SITE_OTHER): Payer: 59 | Admitting: Family Medicine

## 2011-12-03 ENCOUNTER — Encounter: Payer: Self-pay | Admitting: Family Medicine

## 2011-12-03 VITALS — BP 130/80 | HR 58 | Temp 99.0°F | Ht 62.0 in | Wt 154.4 lb

## 2011-12-03 DIAGNOSIS — Z13220 Encounter for screening for lipoid disorders: Secondary | ICD-10-CM

## 2011-12-03 DIAGNOSIS — J302 Other seasonal allergic rhinitis: Secondary | ICD-10-CM

## 2011-12-03 DIAGNOSIS — E559 Vitamin D deficiency, unspecified: Secondary | ICD-10-CM

## 2011-12-03 DIAGNOSIS — J309 Allergic rhinitis, unspecified: Secondary | ICD-10-CM

## 2011-12-03 DIAGNOSIS — Z0000 Encounter for general adult medical examination without abnormal findings: Secondary | ICD-10-CM

## 2011-12-03 DIAGNOSIS — J301 Allergic rhinitis due to pollen: Secondary | ICD-10-CM

## 2011-12-03 LAB — CBC WITH DIFFERENTIAL/PLATELET
Basophils Absolute: 0 10*3/uL (ref 0.0–0.1)
Eosinophils Absolute: 0 10*3/uL (ref 0.0–0.7)
Lymphocytes Relative: 39.5 % (ref 12.0–46.0)
MCHC: 33.1 g/dL (ref 30.0–36.0)
Neutrophils Relative %: 51.7 % (ref 43.0–77.0)
RDW: 14.2 % (ref 11.5–14.6)

## 2011-12-03 LAB — HEPATIC FUNCTION PANEL
Alkaline Phosphatase: 51 U/L (ref 39–117)
Bilirubin, Direct: 0 mg/dL (ref 0.0–0.3)
Total Bilirubin: 0.4 mg/dL (ref 0.3–1.2)

## 2011-12-03 LAB — BASIC METABOLIC PANEL
CO2: 24 mEq/L (ref 19–32)
Calcium: 9.3 mg/dL (ref 8.4–10.5)
Creatinine, Ser: 0.9 mg/dL (ref 0.4–1.2)
Glucose, Bld: 83 mg/dL (ref 70–99)

## 2011-12-03 LAB — LIPID PANEL
HDL: 81.2 mg/dL (ref >39.00)
Total CHOL/HDL Ratio: 3
VLDL: 15 mg/dL (ref 0.0–40.0)

## 2011-12-03 LAB — POCT URINALYSIS DIPSTICK
Leukocytes, UA: NEGATIVE
Protein, UA: NEGATIVE
Spec Grav, UA: 1.02
Urobilinogen, UA: 0.2

## 2011-12-03 LAB — TSH: TSH: 0.98 u[IU]/mL (ref 0.35–5.50)

## 2011-12-03 MED ORDER — AZELASTINE HCL 0.15 % NA SOLN: NASAL | Status: DC

## 2011-12-03 NOTE — Assessment & Plan Note (Signed)
Check labs 

## 2011-12-03 NOTE — Patient Instructions (Addendum)
Preventive Care for Adults, Female A healthy lifestyle and preventive care can promote health and wellness. Preventive health guidelines for women include the following key practices.  A routine yearly physical is a good way to check with your caregiver about your health and preventive screening. It is a chance to share any concerns and updates on your health, and to receive a thorough exam.   Visit your dentist for a routine exam and preventive care every 6 months. Brush your teeth twice a day and floss once a day. Good oral hygiene prevents tooth decay and gum disease.   The frequency of eye exams is based on your age, health, family medical history, use of contact lenses, and other factors. Follow your caregiver's recommendations for frequency of eye exams.   Eat a healthy diet. Foods like vegetables, fruits, whole grains, low-fat dairy products, and lean protein foods contain the nutrients you need without too many calories. Decrease your intake of foods high in solid fats, added sugars, and salt. Eat the right amount of calories for you.Get information about a proper diet from your caregiver, if necessary.   Regular physical exercise is one of the most important things you can do for your health. Most adults should get at least 150 minutes of moderate-intensity exercise (any activity that increases your heart rate and causes you to sweat) each week. In addition, most adults need muscle-strengthening exercises on 2 or more days a week.   Maintain a healthy weight. The body mass index (BMI) is a screening tool to identify possible weight problems. It provides an estimate of body fat based on height and weight. Your caregiver can help determine your BMI, and can help you achieve or maintain a healthy weight.For adults 20 years and older:   A BMI below 18.5 is considered underweight.   A BMI of 18.5 to 24.9 is normal.   A BMI of 25 to 29.9 is considered overweight.   A BMI of 30 and above is  considered obese.   Maintain normal blood lipids and cholesterol levels by exercising and minimizing your intake of saturated fat. Eat a balanced diet with plenty of fruit and vegetables. Blood tests for lipids and cholesterol should begin at age 20 and be repeated every 5 years. If your lipid or cholesterol levels are high, you are over 50, or you are at high risk for heart disease, you may need your cholesterol levels checked more frequently.Ongoing high lipid and cholesterol levels should be treated with medicines if diet and exercise are not effective.   If you smoke, find out from your caregiver how to quit. If you do not use tobacco, do not start.   If you are pregnant, do not drink alcohol. If you are breastfeeding, be very cautious about drinking alcohol. If you are not pregnant and choose to drink alcohol, do not exceed 1 drink per day. One drink is considered to be 12 ounces (355 mL) of beer, 5 ounces (148 mL) of wine, or 1.5 ounces (44 mL) of liquor.   Avoid use of street drugs. Do not share needles with anyone. Ask for help if you need support or instructions about stopping the use of drugs.   High blood pressure causes heart disease and increases the risk of stroke. Your blood pressure should be checked at least every 1 to 2 years. Ongoing high blood pressure should be treated with medicines if weight loss and exercise are not effective.   If you are 55 to 42   years old, ask your caregiver if you should take aspirin to prevent strokes.   Diabetes screening involves taking a blood sample to check your fasting blood sugar level. This should be done once every 3 years, after age 45, if you are within normal weight and without risk factors for diabetes. Testing should be considered at a younger age or be carried out more frequently if you are overweight and have at least 1 risk factor for diabetes.   Breast cancer screening is essential preventive care for women. You should practice "breast  self-awareness." This means understanding the normal appearance and feel of your breasts and may include breast self-examination. Any changes detected, no matter how small, should be reported to a caregiver. Women in their 20s and 30s should have a clinical breast exam (CBE) by a caregiver as part of a regular health exam every 1 to 3 years. After age 40, women should have a CBE every year. Starting at age 40, women should consider having a mammography (breast X-ray test) every year. Women who have a family history of breast cancer should talk to their caregiver about genetic screening. Women at a high risk of breast cancer should talk to their caregivers about having magnetic resonance imaging (MRI) and a mammography every year.   The Pap test is a screening test for cervical cancer. A Pap test can show cell changes on the cervix that might become cervical cancer if left untreated. A Pap test is a procedure in which cells are obtained and examined from the lower end of the uterus (cervix).   Women should have a Pap test starting at age 21.   Between ages 21 and 29, Pap tests should be repeated every 2 years.   Beginning at age 30, you should have a Pap test every 3 years as long as the past 3 Pap tests have been normal.   Some women have medical problems that increase the chance of getting cervical cancer. Talk to your caregiver about these problems. It is especially important to talk to your caregiver if a new problem develops soon after your last Pap test. In these cases, your caregiver may recommend more frequent screening and Pap tests.   The above recommendations are the same for women who have or have not gotten the vaccine for human papillomavirus (HPV).   If you had a hysterectomy for a problem that was not cancer or a condition that could lead to cancer, then you no longer need Pap tests. Even if you no longer need a Pap test, a regular exam is a good idea to make sure no other problems are  starting.   If you are between ages 65 and 70, and you have had normal Pap tests going back 10 years, you no longer need Pap tests. Even if you no longer need a Pap test, a regular exam is a good idea to make sure no other problems are starting.   If you have had past treatment for cervical cancer or a condition that could lead to cancer, you need Pap tests and screening for cancer for at least 20 years after your treatment.   If Pap tests have been discontinued, risk factors (such as a new sexual partner) need to be reassessed to determine if screening should be resumed.   The HPV test is an additional test that may be used for cervical cancer screening. The HPV test looks for the virus that can cause the cell changes on the cervix.   The cells collected during the Pap test can be tested for HPV. The HPV test could be used to screen women aged 30 years and older, and should be used in women of any age who have unclear Pap test results. After the age of 30, women should have HPV testing at the same frequency as a Pap test.   Colorectal cancer can be detected and often prevented. Most routine colorectal cancer screening begins at the age of 50 and continues through age 75. However, your caregiver may recommend screening at an earlier age if you have risk factors for colon cancer. On a yearly basis, your caregiver may provide home test kits to check for hidden blood in the stool. Use of a small camera at the end of a tube, to directly examine the colon (sigmoidoscopy or colonoscopy), can detect the earliest forms of colorectal cancer. Talk to your caregiver about this at age 50, when routine screening begins. Direct examination of the colon should be repeated every 5 to 10 years through age 75, unless early forms of pre-cancerous polyps or small growths are found.   Hepatitis C blood testing is recommended for all people born from 1945 through 1965 and any individual with known risks for hepatitis C.    Practice safe sex. Use condoms and avoid high-risk sexual practices to reduce the spread of sexually transmitted infections (STIs). STIs include gonorrhea, chlamydia, syphilis, trichomonas, herpes, HPV, and human immunodeficiency virus (HIV). Herpes, HIV, and HPV are viral illnesses that have no cure. They can result in disability, cancer, and death. Sexually active women aged 25 and younger should be checked for chlamydia. Older women with new or multiple partners should also be tested for chlamydia. Testing for other STIs is recommended if you are sexually active and at increased risk.   Osteoporosis is a disease in which the bones lose minerals and strength with aging. This can result in serious bone fractures. The risk of osteoporosis can be identified using a bone density scan. Women ages 65 and over and women at risk for fractures or osteoporosis should discuss screening with their caregivers. Ask your caregiver whether you should take a calcium supplement or vitamin D to reduce the rate of osteoporosis.   Menopause can be associated with physical symptoms and risks. Hormone replacement therapy is available to decrease symptoms and risks. You should talk to your caregiver about whether hormone replacement therapy is right for you.   Use sunscreen with sun protection factor (SPF) of 30 or more. Apply sunscreen liberally and repeatedly throughout the day. You should seek shade when your shadow is shorter than you. Protect yourself by wearing long sleeves, pants, a wide-brimmed hat, and sunglasses year round, whenever you are outdoors.   Once a month, do a whole body skin exam, using a mirror to look at the skin on your back. Notify your caregiver of new moles, moles that have irregular borders, moles that are larger than a pencil eraser, or moles that have changed in shape or color.   Stay current with required immunizations.   Influenza. You need a dose every fall (or winter). The composition of  the flu vaccine changes each year, so being vaccinated once is not enough.   Pneumococcal polysaccharide. You need 1 to 2 doses if you smoke cigarettes or if you have certain chronic medical conditions. You need 1 dose at age 65 (or older) if you have never been vaccinated.   Tetanus, diphtheria, pertussis (Tdap, Td). Get 1 dose of   Tdap vaccine if you are younger than age 65, are over 65 and have contact with an infant, are a healthcare worker, are pregnant, or simply want to be protected from whooping cough. After that, you need a Td booster dose every 10 years. Consult your caregiver if you have not had at least 3 tetanus and diphtheria-containing shots sometime in your life or have a deep or dirty wound.   HPV. You need this vaccine if you are a woman age 26 or younger. The vaccine is given in 3 doses over 6 months.   Measles, mumps, rubella (MMR). You need at least 1 dose of MMR if you were born in 1957 or later. You may also need a second dose.   Meningococcal. If you are age 19 to 21 and a first-year college student living in a residence hall, or have one of several medical conditions, you need to get vaccinated against meningococcal disease. You may also need additional booster doses.   Zoster (shingles). If you are age 60 or older, you should get this vaccine.   Varicella (chickenpox). If you have never had chickenpox or you were vaccinated but received only 1 dose, talk to your caregiver to find out if you need this vaccine.   Hepatitis A. You need this vaccine if you have a specific risk factor for hepatitis A virus infection or you simply wish to be protected from this disease. The vaccine is usually given as 2 doses, 6 to 18 months apart.   Hepatitis B. You need this vaccine if you have a specific risk factor for hepatitis B virus infection or you simply wish to be protected from this disease. The vaccine is given in 3 doses, usually over 6 months.  Preventive Services /  Frequency Ages 19 to 39  Blood pressure check.** / Every 1 to 2 years.   Lipid and cholesterol check.** / Every 5 years beginning at age 20.   Clinical breast exam.** / Every 3 years for women in their 20s and 30s.   Pap test.** / Every 2 years from ages 21 through 29. Every 3 years starting at age 30 through age 65 or 70 with a history of 3 consecutive normal Pap tests.   HPV screening.** / Every 3 years from ages 30 through ages 65 to 70 with a history of 3 consecutive normal Pap tests.   Hepatitis C blood test.** / For any individual with known risks for hepatitis C.   Skin self-exam. / Monthly.   Influenza immunization.** / Every year.   Pneumococcal polysaccharide immunization.** / 1 to 2 doses if you smoke cigarettes or if you have certain chronic medical conditions.   Tetanus, diphtheria, pertussis (Tdap, Td) immunization. / A one-time dose of Tdap vaccine. After that, you need a Td booster dose every 10 years.   HPV immunization. / 3 doses over 6 months, if you are 26 and younger.   Measles, mumps, rubella (MMR) immunization. / You need at least 1 dose of MMR if you were born in 1957 or later. You may also need a second dose.   Meningococcal immunization. / 1 dose if you are age 19 to 21 and a first-year college student living in a residence hall, or have one of several medical conditions, you need to get vaccinated against meningococcal disease. You may also need additional booster doses.   Varicella immunization.** / Consult your caregiver.   Hepatitis A immunization.** / Consult your caregiver. 2 doses, 6 to 18 months   apart.   Hepatitis B immunization.** / Consult your caregiver. 3 doses usually over 6 months.  Ages 40 to 64  Blood pressure check.** / Every 1 to 2 years.   Lipid and cholesterol check.** / Every 5 years beginning at age 20.   Clinical breast exam.** / Every year after age 40.   Mammogram.** / Every year beginning at age 40 and continuing for as  long as you are in good health. Consult with your caregiver.   Pap test.** / Every 3 years starting at age 30 through age 65 or 70 with a history of 3 consecutive normal Pap tests.   HPV screening.** / Every 3 years from ages 30 through ages 65 to 70 with a history of 3 consecutive normal Pap tests.   Fecal occult blood test (FOBT) of stool. / Every year beginning at age 50 and continuing until age 75. You may not need to do this test if you get a colonoscopy every 10 years.   Flexible sigmoidoscopy or colonoscopy.** / Every 5 years for a flexible sigmoidoscopy or every 10 years for a colonoscopy beginning at age 50 and continuing until age 75.   Hepatitis C blood test.** / For all people born from 1945 through 1965 and any individual with known risks for hepatitis C.   Skin self-exam. / Monthly.   Influenza immunization.** / Every year.   Pneumococcal polysaccharide immunization.** / 1 to 2 doses if you smoke cigarettes or if you have certain chronic medical conditions.   Tetanus, diphtheria, pertussis (Tdap, Td) immunization.** / A one-time dose of Tdap vaccine. After that, you need a Td booster dose every 10 years.   Measles, mumps, rubella (MMR) immunization. / You need at least 1 dose of MMR if you were born in 1957 or later. You may also need a second dose.   Varicella immunization.** / Consult your caregiver.   Meningococcal immunization.** / Consult your caregiver.   Hepatitis A immunization.** / Consult your caregiver. 2 doses, 6 to 18 months apart.   Hepatitis B immunization.** / Consult your caregiver. 3 doses, usually over 6 months.  Ages 65 and over  Blood pressure check.** / Every 1 to 2 years.   Lipid and cholesterol check.** / Every 5 years beginning at age 20.   Clinical breast exam.** / Every year after age 40.   Mammogram.** / Every year beginning at age 40 and continuing for as long as you are in good health. Consult with your caregiver.   Pap test.** /  Every 3 years starting at age 30 through age 65 or 70 with a 3 consecutive normal Pap tests. Testing can be stopped between 65 and 70 with 3 consecutive normal Pap tests and no abnormal Pap or HPV tests in the past 10 years.   HPV screening.** / Every 3 years from ages 30 through ages 65 or 70 with a history of 3 consecutive normal Pap tests. Testing can be stopped between 65 and 70 with 3 consecutive normal Pap tests and no abnormal Pap or HPV tests in the past 10 years.   Fecal occult blood test (FOBT) of stool. / Every year beginning at age 50 and continuing until age 75. You may not need to do this test if you get a colonoscopy every 10 years.   Flexible sigmoidoscopy or colonoscopy.** / Every 5 years for a flexible sigmoidoscopy or every 10 years for a colonoscopy beginning at age 50 and continuing until age 75.   Hepatitis   C blood test.** / For all people born from 1945 through 1965 and any individual with known risks for hepatitis C.   Osteoporosis screening.** / A one-time screening for women ages 65 and over and women at risk for fractures or osteoporosis.   Skin self-exam. / Monthly.   Influenza immunization.** / Every year.   Pneumococcal polysaccharide immunization.** / 1 dose at age 65 (or older) if you have never been vaccinated.   Tetanus, diphtheria, pertussis (Tdap, Td) immunization. / A one-time dose of Tdap vaccine if you are over 65 and have contact with an infant, are a healthcare worker, or simply want to be protected from whooping cough. After that, you need a Td booster dose every 10 years.   Varicella immunization.** / Consult your caregiver.   Meningococcal immunization.** / Consult your caregiver.   Hepatitis A immunization.** / Consult your caregiver. 2 doses, 6 to 18 months apart.   Hepatitis B immunization.** / Check with your caregiver. 3 doses, usually over 6 months.  ** Family history and personal history of risk and conditions may change your caregiver's  recommendations. Document Released: 05/11/2001 Document Revised: 03/04/2011 Document Reviewed: 08/10/2010 [employer] Patient Information 2012 [employer]. 

## 2011-12-03 NOTE — Assessment & Plan Note (Signed)
Refill meds

## 2011-12-03 NOTE — Progress Notes (Signed)
  Subjective:     Shelley Norman is a 41 y.o. female and is here for a comprehensive physical exam. The patient reports no problems.  History   Social History  . Marital Status: Single    Spouse Name: N/A    Number of Children: N/A  . Years of Education: N/A   Occupational History  . Consultant    Social History Main Topics  . Smoking status: Never Smoker   . Smokeless tobacco: Not on file  . Alcohol Use: 0.6 oz/week    1 Glasses of wine per week  . Drug Use: No  . Sexually Active: Not Currently -- Female partner(s)   Other Topics Concern  . Not on file   Social History Narrative   Exercise---  daily   Health Maintenance  Topic Date Due  . Influenza Vaccine  12/28/2011  . Mammogram  03/28/2012  . Pap Smear  07/03/2014  . Tetanus/tdap  03/11/2015    The following portions of the patient's history were reviewed and updated as appropriate: allergies, current medications, past family history, past medical history, past social history, past surgical history and problem list.  Review of Systems Review of Systems  Constitutional: Negative for activity change, appetite change and fatigue.  HENT: Negative for hearing loss, congestion, tinnitus and ear discharge.  dentist q33m Eyes: Negative for visual disturbance (see optho q1y -- vision corrected to 20/20 with glasses).  Respiratory: Negative for cough, chest tightness and shortness of breath.   Cardiovascular: Negative for chest pain, palpitations and leg swelling.  Gastrointestinal: Negative for abdominal pain, diarrhea, constipation and abdominal distention.  Genitourinary: Negative for urgency, frequency, decreased urine volume and difficulty urinating.  Musculoskeletal: Negative for back pain, arthralgias and gait problem.  Skin: Negative for color change, pallor and rash.  Neurological: Negative for dizziness, light-headedness, numbness and headaches.  Hematological: Negative for adenopathy. Does not bruise/bleed easily.    Psychiatric/Behavioral: Negative for suicidal ideas, confusion, sleep disturbance, self-injury, dysphoric mood, decreased concentration and agitation.       Objective:    BP 130/80  Pulse 58  Temp 99 F (37.2 C) (Oral)  Ht 5\' 2"  (1.575 m)  Wt 154 lb 6.4 oz (70.035 kg)  BMI 28.24 kg/m2  SpO2 98% General appearance: alert, cooperative, appears stated age and no distress Head: Normocephalic, without obvious abnormality, atraumatic Eyes: conjunctivae/corneas clear. PERRL, EOM's intact. Fundi benign. Ears: normal TM's and external ear canals both ears Nose: Nares normal. Septum midline. Mucosa normal. No drainage or sinus tenderness. Throat: lips, mucosa, and tongue normal; teeth and gums normal Neck: no adenopathy, no carotid bruit, no JVD, supple, symmetrical, trachea midline and thyroid not enlarged, symmetric, no tenderness/mass/nodules Back: symmetric, no curvature. ROM normal. No CVA tenderness. Lungs: clear to auscultation bilaterally Breasts: normal appearance, no masses or tenderness Heart: regular rate and rhythm, S1, S2 normal, no murmur, click, rub or gallop Abdomen: soft, non-tender; bowel sounds normal; no masses,  no organomegaly Pelvic: deferred-- gyn Extremities: extremities normal, atraumatic, no cyanosis or edema Pulses: 2+ and symmetric Skin: Skin color, texture, turgor normal. No rashes or lesions Lymph nodes: Cervical, supraclavicular, and axillary nodes normal. Neurologic: Alert and oriented X 3, normal strength and tone. Normal symmetric reflexes. Normal coordination and gait psych--  no anxiety or depression    Assessment:    Healthy female exam.      Plan:  Check labs  ghm utd See After Visit Summary for Counseling Recommendations

## 2011-12-06 LAB — LDL CHOLESTEROL, DIRECT: Direct LDL: 119.6 mg/dL

## 2011-12-10 ENCOUNTER — Telehealth: Payer: Self-pay | Admitting: Family Medicine

## 2011-12-10 ENCOUNTER — Telehealth: Payer: Self-pay

## 2011-12-10 DIAGNOSIS — J302 Other seasonal allergic rhinitis: Secondary | ICD-10-CM

## 2011-12-10 MED ORDER — AZELASTINE HCL 0.15 % NA SOLN: NASAL | Status: DC

## 2011-12-10 NOTE — Telephone Encounter (Signed)
Prior Authorization initiated for Astepro nasal spray-- pending Case ID # [id number].     KP

## 2011-12-10 NOTE — Telephone Encounter (Signed)
Pt states her rx for Vicki Mallet is not at the pharmacy. I verified that she uses [facility] on [address] and called pharmacy. They do not have rx on file. Please re-send.

## 2011-12-10 NOTE — Telephone Encounter (Signed)
Re-sent.      KP 

## 2011-12-23 ENCOUNTER — Ambulatory Visit: Payer: 59 | Admitting: Internal Medicine

## 2011-12-23 NOTE — Telephone Encounter (Signed)
We don't get samples anymore.  We can give her dymista (flonase + astepro) and then she would not need nasonex .  There is a coupon for dymista --- but she can try a sample.

## 2011-12-23 NOTE — Telephone Encounter (Signed)
PA not approved and the PA department could not find any nasal sprays that are covered.       KP

## 2011-12-23 NOTE — Telephone Encounter (Signed)
Discussed with patient Shelley Norman samples left at check in. She will find out what nasal spray she can use when she calls the insurance company and will call back to make [place] aware.      KP

## 2011-12-24 ENCOUNTER — Encounter: Payer: Self-pay | Admitting: Family Medicine

## 2011-12-24 ENCOUNTER — Ambulatory Visit (INDEPENDENT_AMBULATORY_CARE_PROVIDER_SITE_OTHER): Payer: 59 | Admitting: Family Medicine

## 2011-12-24 VITALS — BP 120/78 | HR 72 | Temp 99.3°F | Wt 156.6 lb

## 2011-12-24 DIAGNOSIS — J069 Acute upper respiratory infection, unspecified: Secondary | ICD-10-CM

## 2011-12-24 MED ORDER — HYDROCOD POLST-CHLORPHEN POLST 10-8 MG/5ML PO LQCR: 5.0000 mL | Freq: Every evening | ORAL | Status: DC | PRN

## 2011-12-24 NOTE — Patient Instructions (Signed)

## 2011-12-24 NOTE — Progress Notes (Signed)
  Subjective:     Shelley Norman is a 41 y.o. female who presents for evaluation of symptoms of a URI. Symptoms include congestion, lightheadedness, nasal congestion, non productive cough and post nasal drip. Onset of symptoms was a few days ago, and has been gradually improving since that time. Treatment to date: antihistamines.  The following portions of the patient's history were reviewed and updated as appropriate: allergies, current medications, past family history, past medical history, past social history, past surgical history and problem list.  Review of Systems Pertinent items are noted in HPI.   Objective:    BP 120/78  Pulse 72  Temp 99.3 F (37.4 C) (Oral)  Wt 156 lb 9.6 oz (71.033 kg)  SpO2 97%  BP 120/78  Pulse 72  Temp 99.3 F (37.4 C) (Oral)  Wt 156 lb 9.6 oz (71.033 kg)  SpO2 97% General appearance: alert, cooperative, appears stated age and no distress Ears: normal TM's and external ear canals both ears Nose: clear discharge, moderate congestion, turbinates red, swollen Throat: abnormal findings: pnd Neck: no adenopathy, supple, symmetrical, trachea midline and thyroid not enlarged, symmetric, no tenderness/mass/nodules Lungs: clear to auscultation bilaterally Heart: S1, S2 normal Assessment:    viral upper respiratory illness   Plan:    Discussed diagnosis and treatment of URI. Suggested symptomatic OTC remedies. Nasal saline spray for congestion. Nasal steroids per orders. Follow up as needed.

## 2012-01-17 ENCOUNTER — Telehealth: Payer: Self-pay | Admitting: Family Medicine

## 2012-01-17 NOTE — Telephone Encounter (Signed)
Patient called stating she received a bill for $4.09 from [facility] for having her Vit D level checked. She states we used the wrong code. She would like the code changed to what we used last year. DOS is 12-03-11. Please advise.

## 2012-01-18 NOTE — Telephone Encounter (Signed)
Called pt on number give, as per DPR ok to LDM-advised I have spoken with [facility] & updated pt Dx to be 26.9 as per chart Dx. Per [facility] will re-bill-Left detailed mess on HP

## 2012-02-01 ENCOUNTER — Ambulatory Visit (INDEPENDENT_AMBULATORY_CARE_PROVIDER_SITE_OTHER): Payer: 59 | Admitting: Obstetrics and Gynecology

## 2012-02-01 ENCOUNTER — Encounter: Payer: Self-pay | Admitting: Obstetrics and Gynecology

## 2012-02-01 ENCOUNTER — Ambulatory Visit: Payer: Self-pay | Admitting: Obstetrics and Gynecology

## 2012-02-01 VITALS — BP 112/60 | Ht 61.5 in | Wt 160.0 lb

## 2012-02-01 DIAGNOSIS — B379 Candidiasis, unspecified: Secondary | ICD-10-CM | POA: Insufficient documentation

## 2012-02-01 DIAGNOSIS — A499 Bacterial infection, unspecified: Secondary | ICD-10-CM | POA: Insufficient documentation

## 2012-02-01 DIAGNOSIS — N644 Mastodynia: Secondary | ICD-10-CM | POA: Insufficient documentation

## 2012-02-01 DIAGNOSIS — D219 Benign neoplasm of connective and other soft tissue, unspecified: Secondary | ICD-10-CM

## 2012-02-01 DIAGNOSIS — N942 Vaginismus: Secondary | ICD-10-CM | POA: Insufficient documentation

## 2012-02-01 DIAGNOSIS — D259 Leiomyoma of uterus, unspecified: Secondary | ICD-10-CM | POA: Insufficient documentation

## 2012-02-01 DIAGNOSIS — Z124 Encounter for screening for malignant neoplasm of cervix: Secondary | ICD-10-CM

## 2012-02-01 MED ORDER — NORGESTIM-ETH ESTRAD TRIPHASIC 0.18/0.215/0.25 MG-35 MCG PO TABS: 1.0000 | ORAL_TABLET | Freq: Every day | ORAL | Status: DC

## 2012-02-01 NOTE — Progress Notes (Signed)
ANNUAL:  Last Pap: 02/22/2011   WNL: Yes HPV not detected Regular Periods:yes Contraception: Orthotricycline Lo Monthly Breast exam:yes Tetanus<62yrs:no Nl.Bladder Function:yes Daily BMs:yes Healthy Diet:yes Calcium:yes Mammogram:yes Date of Mammogram: 03/29/2011 Exercise:yes Have often Exercise: 5-6 hours weekly Seatbelt: yes Abuse at home: no Stressful work:no Sigmoid-colonoscopy: N/a Bone Density: No PCP: Dr. Laury Axon Change in PMH: None Change in [facility]: None  Subjective:    Shelley Norman is a 41 y.o. female, [id number], who presents for an annual exam.     History   Social History  . Marital Status: Single    Spouse Name: N/A    Number of Children: N/A  . Years of Education: N/A   Occupational History  . Consultant    Social History Main Topics  . Smoking status: Never Smoker   . Smokeless tobacco: Never Used  . Alcohol Use: 0.6 oz/week    1 Glasses of wine per week  . Drug Use: No  . Sexually Active: Not Currently -- Female partner(s)    Birth Control/ Protection: Pill   Other Topics Concern  . None   Social History Narrative   Exercise---  daily    Menstrual cycle:   LMP: Patient's last menstrual period was 01/28/2012.           Cycle: monthly menses.  No IM bleeding. Occassional cramps relieved by OTC ibuprofen.  No IM abdominal pain  The following portions of the patient's history were reviewed and updated as appropriate: allergies, current medications, past family history, past medical history, past social history, past surgical history and problem list.  Review of Systems Pertinent items are noted in HPI. Breast:Negative for breast lump,nipple discharge or nipple retraction Gastrointestinal: Negative for abdominal pain, change in bowel habits or rectal bleeding Urinary:negative   Objective:    BP 112/60  Ht 5' 1.5" (1.562 m)  Wt 160 lb (72.576 kg)  BMI 29.74 kg/m2  LMP 01/28/2012    Weight:  Wt Readings from Last 1 Encounters:  02/01/12  160 lb (72.576 kg)          BMI: Body mass index is 29.74 kg/(m^2).  General Appearance: Alert, appropriate appearance for age. No acute distress HEENT: Grossly normal Neck / Thyroid: Supple, no masses, nodes or enlargement Lungs: clear to auscultation bilaterally Back: No CVA tenderness Breast Exam: No masses or nodes.No dimpling, nipple retraction or discharge. Cardiovascular: Regular rate and rhythm. S1, S2, no murmur Gastrointestinal: Soft, non-tender, no organomegaly. Nontender suprapubic mass Pelvic Exam: External genitalia: normal general appearance Vaginal: normal rugae Cervix: normal appearance Adnexa: non palpable Uterus: irregular enlargement 10 wks size Rectovaginal: normal rectal, no masses Lymphatic Exam: Non-palpable nodes in neck, clavicular, axillary, or inguinal regions Skin: no rash or abnormalities Neurologic: Normal gait and speech, no tremor  Psychiatric: Alert and oriented, appropriate affect.   Wet Prep:not applicable Urinalysis:not applicable UPT: Not done   Assessment:    fibroids.  Menorrhagia and dysmenorrhea controlled with BCPs   Plan:    mammogram pap smear due 2015 return annually or prn STD screening: declined Contraception:oral contraceptives (estrogen/progesterone)   Dierdre Forth MD

## 2012-03-13 ENCOUNTER — Other Ambulatory Visit: Payer: Self-pay | Admitting: Obstetrics and Gynecology

## 2012-03-13 DIAGNOSIS — Z1231 Encounter for screening mammogram for malignant neoplasm of breast: Secondary | ICD-10-CM

## 2012-03-23 ENCOUNTER — Ambulatory Visit: Payer: 59 | Admitting: Internal Medicine

## 2012-03-24 ENCOUNTER — Telehealth: Payer: Self-pay | Admitting: Internal Medicine

## 2012-03-24 ENCOUNTER — Encounter: Payer: Self-pay | Admitting: Internal Medicine

## 2012-03-24 ENCOUNTER — Ambulatory Visit (INDEPENDENT_AMBULATORY_CARE_PROVIDER_SITE_OTHER): Payer: 59 | Admitting: Internal Medicine

## 2012-03-24 VITALS — BP 114/78 | HR 84 | Ht 62.0 in | Wt 164.0 lb

## 2012-03-24 DIAGNOSIS — R05 Cough: Secondary | ICD-10-CM

## 2012-03-24 MED ORDER — AZELASTINE-FLUTICASONE 137-50 MCG/ACT NA SUSP: 2.0000 | NASAL | Status: DC | PRN

## 2012-03-24 NOTE — Patient Instructions (Addendum)
We will be happy to see you again here if needed  Script sent to [facility] for Dymista nasal spray. If your insurance isn't covering it, then you can ask Dr Laury Axon to refill your Nasonex

## 2012-03-24 NOTE — Progress Notes (Signed)
Patient ID: Shelley Norman, female    DOB: May 08, 1970, 41 y.o.   MRN: [id number]  HPI 41 yo F never smoker followed for allergic rhinitis and cough. 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 She now  is doing quite well, without drainage, cough, wheeze of respiratory distress.. Chest is ok with much less cough. Qvar may have been causing sores in mouth but otherwise she feels situation is well controlled and satisfactory. She stopped the Qvar 2 weeks ago.   02/09/11- 24 yo F never smoker followed for allergic rhinitis and cough. Has already had a flu shot. She was able to drop off medications through the summer. She caught a cold at the beach in [place] and has been slow to clear cough. She only got well 2 weeks ago. She took Singulair and used a rescue inhaler but thought that tea with ginger helped the most. 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  03/24/12- 48 yo F never smoker followed for allergic rhinitis and cough. Yearly Follow up.  Cold started last week -- started with sore throat.  Now has some nasal congestion and slight nonprod cough.  No SOB, wheezing, chest tightness, chest pain, or f/c/s. Had flu vaccine. Had a cold last week but now spontaneously improving. Otherwise no significant problems over past year. She is not regularly using any of her pulmonary medicines including Singulair. CXR 06/05/10 IMPRESSION:  Negative chest.  Original Report Authenticated By: Jamesetta Orleans. [name], M.D.    Review of Systems-See HPI Constitutional:   No-   weight loss, night sweats, fevers, chills, fatigue, lassitude. HEENT:   No-  headaches, difficulty swallowing, tooth/dental problems, sore throat,       No-  sneezing, itching, ear ache, +nasal  congestion, post nasal drip,  CV:  No-   chest pain, orthopnea, PND, swelling in lower extremities, anasarca, dizziness, palpitations Resp: No-   shortness of breath with exertion or at rest.              No-   productive cough,  No non-productive cough,  No- coughing up of blood.              No-   change in color of mucus.  No- wheezing.   Skin: No-   rash or lesions. GI:  No-   heartburn, indigestion, abdominal pain, nausea, vomiting,  GU:  MS:  No-   joint pain or swelling.  . Neuro-     nothing unusual Psych:  No- change in mood or affect. No depression or anxiety.  No memory loss.    Objective:   Physical Exam General- Alert, Oriented, Affect-appropriate, Distress- none acute Skin- rash-none, lesions- none, excoriation- none Lymphadenopathy- none Head- atraumatic            Eyes- Gross vision intact, PERRLA, conjunctivae clear secretions            Ears- Hearing, canals-normal            Nose- Clear, no-Septal dev, mucus, polyps, erosion, perforation             Throat- Mallampati II , mucosa clear , drainage- none, tonsils- atrophic; torus Neck- flexible , trachea midline, no stridor ,  thyroid nl, carotid no bruit Chest - symmetrical excursion , unlabored           Heart/CV- RRR , no murmur , no gallop  , no rub, nl s1 s2           - JVD- none , edema- none, stasis changes- none, varices- none            Lung- clear to P&A, wheeze- none, cough- none , dullness-none, rub- none           Chest wall-  Abd-  Br/ Gen/ Rectal- Not done, not indicated Extrem- cyanosis- none, clubbing, none, atrophy- none, strength- nl Neuro- grossly intact to observation

## 2012-03-24 NOTE — Telephone Encounter (Signed)
Spoke with pt and scheduled her rov for today at 3 pm

## 2012-03-27 ENCOUNTER — Telehealth: Payer: Self-pay | Admitting: Internal Medicine

## 2012-03-27 DIAGNOSIS — J069 Acute upper respiratory infection, unspecified: Secondary | ICD-10-CM

## 2012-03-27 MED ORDER — HYDROCOD POLST-CHLORPHEN POLST 10-8 MG/5ML PO LQCR: 5.0000 mL | Freq: Two times a day (BID) | ORAL | Status: DC | PRN

## 2012-03-27 NOTE — Telephone Encounter (Signed)
Spoke with patient-states she seen CY on Friday and forgot to ask for Tussionex cough syrup; pt aware I will send message to CY to advise if okay to call in Rx to [facility] [address] for patient.

## 2012-03-27 NOTE — Telephone Encounter (Signed)
Spoke with pt and notified of recs per CDY She verbalized understanding and rx was called to pharm Nothing further needed

## 2012-03-27 NOTE — Telephone Encounter (Signed)
Per CY-okay to call in Rx for Tussionex #200 ml 1 tsp every 12 hours prn cough no refills.

## 2012-03-28 ENCOUNTER — Telehealth: Payer: Self-pay | Admitting: Internal Medicine

## 2012-03-28 ENCOUNTER — Telehealth: Payer: Self-pay | Admitting: Family Medicine

## 2012-03-28 MED ORDER — AZELASTINE HCL 0.1 % NA SOLN: 1.0000 | Freq: Every day | NASAL | Status: DC

## 2012-03-28 NOTE — Telephone Encounter (Signed)
Spoke with pt She states dymista requires PA Dr Maple Hudson, please advise if you want to call in another med or proceed with PA, thanks

## 2012-03-28 NOTE — Telephone Encounter (Signed)
Per CDY- call in astelin  1-2 sprays each nostril and advise pt to use this at the same time she uses nasonex

## 2012-03-28 NOTE — Telephone Encounter (Signed)
pt called PCP for med rx by Pulm. needed PA--pa printed from sept & faxed to [facility] for Dymistia, by kim payne

## 2012-04-05 ENCOUNTER — Inpatient Hospital Stay: Admission: RE | Admit: 2012-04-05 | Payer: 59 | Source: Ambulatory Visit

## 2012-04-05 NOTE — Assessment & Plan Note (Addendum)
Multifactorial cough with components of postnasal drip and uncertain role of reflux. Now resolving a viral pattern upper respiratory infection/cold. Does not need strategy change and overall has done well in the past year. Now getting over an incidental cold without needing special intervention.

## 2012-04-06 ENCOUNTER — Telehealth: Payer: Self-pay | Admitting: Family Medicine

## 2012-04-06 NOTE — Telephone Encounter (Signed)
Tried to initiated PA per rep Pt no longer has coverage with them. Advise Pt who indicated that she will check with her insurance and give [place] a call back in morning with correct info.Marland Kitchen

## 2012-04-06 NOTE — Telephone Encounter (Signed)
Patient called to check on status of prior authorization. CB# [phone]

## 2012-04-11 MED ORDER — BECLOMETHASONE DIPROPIONATE 80 MCG/ACT NA AERS: 2.0000 | INHALATION_SPRAY | Freq: Every day | NASAL | Status: DC

## 2012-04-11 NOTE — Telephone Encounter (Signed)
Spoke to insurance dymista is a plan exclusion no PA allowed. The following are med covered: nasonex,Flonase, qnasl all other med fall under plan exclusion.

## 2012-04-11 NOTE — Telephone Encounter (Signed)
Discussed with patient and she voiced understanding. Sample left at check in before we call in the script.      KP

## 2012-04-11 NOTE — Telephone Encounter (Signed)
Try qnasl 2 sprays each nostril qd #1  5 refills

## 2012-04-25 ENCOUNTER — Ambulatory Visit
Admission: RE | Admit: 2012-04-25 | Discharge: 2012-04-25 | Disposition: A | Discharge status: Home / Self Care | Payer: 59 | Source: Ambulatory Visit | Attending: Obstetrics and Gynecology | Admitting: Obstetrics and Gynecology

## 2012-04-25 DIAGNOSIS — Z1231 Encounter for screening mammogram for malignant neoplasm of breast: Secondary | ICD-10-CM

## 2012-04-26 ENCOUNTER — Encounter: Payer: Self-pay | Admitting: Obstetrics and Gynecology

## 2012-04-26 DIAGNOSIS — R922 Inconclusive mammogram: Secondary | ICD-10-CM | POA: Insufficient documentation

## 2012-06-07 ENCOUNTER — Encounter: Payer: Self-pay | Admitting: Family Medicine

## 2012-09-05 ENCOUNTER — Ambulatory Visit: Payer: 59 | Admitting: Family Medicine

## 2012-10-18 ENCOUNTER — Ambulatory Visit (INDEPENDENT_AMBULATORY_CARE_PROVIDER_SITE_OTHER): Payer: BC Managed Care – PPO | Admitting: Family Medicine

## 2012-10-18 ENCOUNTER — Encounter: Payer: Self-pay | Admitting: Family Medicine

## 2012-10-18 VITALS — BP 98/60 | HR 77 | Temp 99.1°F | Ht 61.0 in | Wt 155.2 lb

## 2012-10-18 DIAGNOSIS — H6123 Impacted cerumen, bilateral: Secondary | ICD-10-CM

## 2012-10-18 DIAGNOSIS — H6120 Impacted cerumen, unspecified ear: Secondary | ICD-10-CM | POA: Insufficient documentation

## 2012-10-18 NOTE — Assessment & Plan Note (Signed)
New.  L ear cerumen successfully removed w/ curette.  R ear successfully irrigated.  Hearing instantly improved.  Pt very pleased w/ results.

## 2012-10-18 NOTE — Progress Notes (Signed)
  Subjective:    Patient ID: Shelley Norman, female    DOB: 05-03-1970, 42 y.o.   MRN: [id number]  HPI Cerumen impaction- pt reports R ear is 'completely blocked' w/ decreased hearing.  No pain.  No drainage.  Bough OTC Debrox and attempted to remove w/ Qtip, 'but i think i made it worse'.  Similar sxs on L but not as severe.  No fevers.   Review of Systems For ROS see HPI     Objective:   Physical Exam  Vitals reviewed. Constitutional: She appears well-developed and well-nourished. No distress.  HENT:  Head: Normocephalic and atraumatic.  R TM completely obscured by wall of wax, unable to use curette.  Successfully irrigated.  TM WNL. L TM mostly obscured by cerumen but small window of visibility at upper R margin.  Large clump of wax manually removed w/ curette, TM WNL.          Assessment & Plan:

## 2013-01-09 ENCOUNTER — Telehealth: Payer: Self-pay

## 2013-01-09 NOTE — Telephone Encounter (Addendum)
Medication and allergies:   for 90 day supply (mail order)  for local pharmacy   Immunizations due: UTD WE flu vaccine  A/P:  Last:  PAP:    UTD 06/2011      MMG: UTD 03/2012  Dexa: n/a  CCS: n/a DM: n/a HTN: n/a   Lipids:  due  To Discuss with Provider:  Unable to reach prior to visit

## 2013-01-10 ENCOUNTER — Ambulatory Visit (INDEPENDENT_AMBULATORY_CARE_PROVIDER_SITE_OTHER): Payer: BC Managed Care – PPO | Admitting: Family Medicine

## 2013-01-10 ENCOUNTER — Encounter: Payer: Self-pay | Admitting: Family Medicine

## 2013-01-10 VITALS — BP 100/68 | HR 66 | Temp 98.8°F | Ht 62.0 in | Wt 156.2 lb

## 2013-01-10 DIAGNOSIS — Z23 Encounter for immunization: Secondary | ICD-10-CM

## 2013-01-10 DIAGNOSIS — M25562 Pain in left knee: Secondary | ICD-10-CM

## 2013-01-10 DIAGNOSIS — Z0000 Encounter for general adult medical examination without abnormal findings: Secondary | ICD-10-CM

## 2013-01-10 DIAGNOSIS — E559 Vitamin D deficiency, unspecified: Secondary | ICD-10-CM

## 2013-01-10 DIAGNOSIS — M25569 Pain in unspecified knee: Secondary | ICD-10-CM

## 2013-01-10 LAB — BASIC METABOLIC PANEL
BUN: 13 mg/dL (ref 6–23)
CO2: 26 mEq/L (ref 19–32)
Calcium: 9.4 mg/dL (ref 8.4–10.5)
Chloride: 103 mEq/L (ref 96–112)
Creatinine, Ser: 1 mg/dL (ref 0.4–1.2)
GFR: 78.56 mL/min (ref >60.00)
Glucose, Bld: 84 mg/dL (ref 70–99)
Potassium: 4 mEq/L (ref 3.5–5.1)
Sodium: 137 mEq/L (ref 135–145)

## 2013-01-10 LAB — POCT URINALYSIS DIPSTICK
Blood, UA: NEGATIVE
Glucose, UA: NEGATIVE
Ketones, UA: NEGATIVE
Leukocytes, UA: NEGATIVE
Protein, UA: NEGATIVE
Spec Grav, UA: 1.015
Urobilinogen, UA: 0.2
pH, UA: 6

## 2013-01-10 LAB — HEPATIC FUNCTION PANEL
AST: 21 U/L (ref 0–37)
Albumin: 4.2 g/dL (ref 3.5–5.2)
Alkaline Phosphatase: 49 U/L (ref 39–117)
Bilirubin, Direct: 0 mg/dL (ref 0.0–0.3)
Total Bilirubin: 0.3 mg/dL (ref 0.3–1.2)
Total Protein: 8 g/dL (ref 6.0–8.3)

## 2013-01-10 LAB — LIPID PANEL
HDL: 76.4 mg/dL (ref >39.00)
Total CHOL/HDL Ratio: 3
Triglycerides: 131 mg/dL (ref 0.0–149.0)
VLDL: 26.2 mg/dL (ref 0.0–40.0)

## 2013-01-10 LAB — CBC WITH DIFFERENTIAL/PLATELET
Basophils Relative: 0.5 % (ref 0.0–3.0)
Hemoglobin: 12.4 g/dL (ref 12.0–15.0)
Lymphocytes Relative: 41.5 % (ref 12.0–46.0)
Monocytes Absolute: 0.3 10*3/uL (ref 0.1–1.0)
Monocytes Relative: 6.8 % (ref 3.0–12.0)
Neutro Abs: 1.9 10*3/uL (ref 1.4–7.7)
Neutrophils Relative %: 49.1 % (ref 43.0–77.0)
RBC: 4 Mil/uL (ref 3.87–5.11)
WBC: 4 10*3/uL — ABNORMAL LOW (ref 4.5–10.5)

## 2013-01-10 LAB — TSH: TSH: 1.5 u[IU]/mL (ref 0.35–5.50)

## 2013-01-10 LAB — LDL CHOLESTEROL, DIRECT: Direct LDL: 113.3 mg/dL

## 2013-01-10 NOTE — Progress Notes (Signed)
Subjective:     Shelley Norman is a 42 y.o. female and is here for a comprehensive physical exam. The patient reports no problems.  History   Social History  . Marital Status: Single    Spouse Name: N/A    Number of Children: N/A  . Years of Education: N/A   Occupational History  . Consultant    Social History Main Topics  . Smoking status: Never Smoker   . Smokeless tobacco: Never Used  . Alcohol Use: 0.6 oz/week    1 Glasses of wine per week  . Drug Use: No  . Sexual Activity: Not Currently    Partners: Male    Birth Control/ Protection: Pill   Other Topics Concern  . Not on file   Social History Narrative   Exercise---  daily   Health Maintenance  Topic Date Due  . Mammogram  04/25/2013  . Influenza Vaccine  10/27/2013  . Pap Smear  07/03/2014  . Tetanus/tdap  03/11/2015    The following portions of the patient's history were reviewed and updated as appropriate:  She  has a past medical history of Allergic rhinitis; Vitamin D deficiency; Osteoarthritis; Mastodynia; Vaginismus; Yeast infection; and Bacterial infection. She  does not have any pertinent problems on file. She  has past surgical history that includes Foot surgery and Wisdom tooth extraction. Her family history includes Arthritis in her mother; Breast cancer in her sister; Diabetes in her father; Hyperlipidemia in her mother; Hypertension in her mother; Osteoporosis in an other family member. She  reports that she has never smoked. She has never used smokeless tobacco. She reports that she drinks about 0.6 ounces of alcohol per week. She reports that she does not use illicit drugs. She has a current medication list which includes the following prescription(s): azelastine, azelastine-fluticasone, beclomethasone dipropionate, bee pollen, benzonatate, biotin, calcium carbonate, chlorpheniramine-hydrocodone, vitamin d3, fish oil-omega-3 fatty acids, loratadine, magnesium, misc natural products, mometasone,  montelukast, norgestimate-ethinyl estradiol triphasic, and voltaren. Current Outpatient Prescriptions on File Prior to Visit  Medication Sig Dispense Refill  . azelastine (ASTELIN) 137 MCG/SPRAY nasal spray Place 1-2 sprays into the nose at bedtime. Use in each nostril as directed  30 mL  11  . Azelastine-Fluticasone (DYMISTA) 137-50 MCG/ACT SUSP Place 2 sprays into the nose as needed.  1 Bottle  prn  . Beclomethasone Dipropionate 80 MCG/ACT AERS Place 2 sprays into the nose daily.  1 Inhaler  0  . Bee Pollen 1000 MG TABS Take 1 tablet by mouth daily.        . benzonatate (TESSALON) 200 MG capsule Take 200 mg by mouth 3 (three) times daily as needed.        . Biotin 2.5 MG TABS Take 1 tablet by mouth daily.        . calcium carbonate (OS-CAL) 600 MG TABS Take 600 mg by mouth 2 (two) times daily with a meal.        . chlorpheniramine-HYDROcodone (TUSSIONEX PENNKINETIC ER) 10-8 MG/5ML LQCR Take 5 mLs by mouth every 12 (twelve) hours as needed.  200 mL  0  . Cholecalciferol (VITAMIN D3) 2000 UNITS TABS Take 1 tablet by mouth daily.        . fish oil-omega-3 fatty acids 1000 MG capsule Take 1 g by mouth 2 (two) times daily.        Marland Kitchen loratadine (CLARITIN) 10 MG tablet Take 10 mg by mouth daily.        . Magnesium 250 MG TABS  Take 1 tablet by mouth daily.        . Misc Natural Products (OSTEO BI-FLEX ADV DOUBLE ST PO) Take 1 tablet by mouth 2 (two) times daily.        . mometasone (NASONEX) 50 MCG/ACT nasal spray 2 sprays by Nasal route daily as needed.       . montelukast (SINGULAIR) 10 MG tablet Take 10 mg by mouth daily as needed.      . Norgestimate-Ethinyl Estradiol Triphasic (ORTHO TRI-CYCLEN, 28,) 0.18/0.215/0.25 MG-35 MCG tablet Take 1 tablet by mouth daily.  3 Package  3  . VOLTAREN 1 % GEL as needed.        No current facility-administered medications on file prior to visit.   She is allergic to tetracycline..  Review of Systems Review of Systems  Constitutional: Negative for activity  change, appetite change and fatigue.  HENT: Negative for hearing loss, congestion, tinnitus and ear discharge.  dentist q58m Eyes: Negative for visual disturbance (see optho q1y -- vision corrected to 20/20 with glasses).  Respiratory: Negative for cough, chest tightness and shortness of breath.   Cardiovascular: Negative for chest pain, palpitations and leg swelling.  Gastrointestinal: Negative for abdominal pain, diarrhea, constipation and abdominal distention.  Genitourinary: Negative for urgency, frequency, decreased urine volume and difficulty urinating.  Musculoskeletal: Negative for back pain, arthralgias and gait problem.  Skin: Negative for color change, pallor and rash.  Neurological: Negative for dizziness, light-headedness, numbness and headaches.  Hematological: Negative for adenopathy. Does not bruise/bleed easily.  Psychiatric/Behavioral: Negative for suicidal ideas, confusion, sleep disturbance, self-injury, dysphoric mood, decreased concentration and agitation.       Objective:    BP 100/68  Pulse 66  Temp(Src) 98.8 F (37.1 C) (Oral)  Ht 5\' 2"  (1.575 m)  Wt 156 lb 3.2 oz (70.852 kg)  BMI 28.56 kg/m2  SpO2 97% General appearance: alert, cooperative, appears stated age and no distress Head: Normocephalic, without obvious abnormality, atraumatic Eyes: conjunctivae/corneas clear. PERRL, EOM's intact. Fundi benign. Ears: normal TM's and external ear canals both ears Nose: Nares normal. Septum midline. Mucosa normal. No drainage or sinus tenderness. Throat: lips, mucosa, and tongue normal; teeth and gums normal Neck: no adenopathy, no carotid bruit, no JVD, supple, symmetrical, trachea midline and thyroid not enlarged, symmetric, no tenderness/mass/nodules Back: symmetric, no curvature. ROM normal. No CVA tenderness. Lungs: clear to auscultation bilaterally Breasts: normal appearance, no masses or tenderness Heart: regular rate and rhythm, S1, S2 normal, no murmur,  click, rub or gallop Abdomen: soft, non-tender; bowel sounds normal; no masses,  no organomegaly Pelvic: deferred-gyn Extremities: extremities normal, atraumatic, no cyanosis or edema Pulses: 2+ and symmetric Skin: Skin color, texture, turgor normal. No rashes or lesions Lymph nodes: Cervical, supraclavicular, and axillary nodes normal. Neurologic: Alert and oriented X 3, normal strength and tone. Normal symmetric reflexes. Normal coordination and gait Psych-no depressio, no anxiety      Assessment:    Healthy female exam.      Plan:     ghm utd Check labs See After Visit Summary for Counseling Recommendations

## 2013-01-10 NOTE — Patient Instructions (Signed)
Preventive Care for Adults, Female A healthy lifestyle and preventive care can promote health and wellness. Preventive health guidelines for women include the following key practices.  A routine yearly physical is a good way to check with your caregiver about your health and preventive screening. It is a chance to share any concerns and updates on your health, and to receive a thorough exam.  Visit your dentist for a routine exam and preventive care every 6 months. Brush your teeth twice a day and floss once a day. Good oral hygiene prevents tooth decay and gum disease.  The frequency of eye exams is based on your age, health, family medical history, use of contact lenses, and other factors. Follow your caregiver's recommendations for frequency of eye exams.  Eat a healthy diet. Foods like vegetables, fruits, whole grains, low-fat dairy products, and lean protein foods contain the nutrients you need without too many calories. Decrease your intake of foods high in solid fats, added sugars, and salt. Eat the right amount of calories for you.Get information about a proper diet from your caregiver, if necessary.  Regular physical exercise is one of the most important things you can do for your health. Most adults should get at least 150 minutes of moderate-intensity exercise (any activity that increases your heart rate and causes you to sweat) each week. In addition, most adults need muscle-strengthening exercises on 2 or more days a week.  Maintain a healthy weight. The body mass index (BMI) is a screening tool to identify possible weight problems. It provides an estimate of body fat based on height and weight. Your caregiver can help determine your BMI, and can help you achieve or maintain a healthy weight.For adults 20 years and older:  A BMI below 18.5 is considered underweight.  A BMI of 18.5 to 24.9 is normal.  A BMI of 25 to 29.9 is considered overweight.  A BMI of 30 and above is  considered obese.  Maintain normal blood lipids and cholesterol levels by exercising and minimizing your intake of saturated fat. Eat a balanced diet with plenty of fruit and vegetables. Blood tests for lipids and cholesterol should begin at age 20 and be repeated every 5 years. If your lipid or cholesterol levels are high, you are over 50, or you are at high risk for heart disease, you may need your cholesterol levels checked more frequently.Ongoing high lipid and cholesterol levels should be treated with medicines if diet and exercise are not effective.  If you smoke, find out from your caregiver how to quit. If you do not use tobacco, do not start.  If you are pregnant, do not drink alcohol. If you are breastfeeding, be very cautious about drinking alcohol. If you are not pregnant and choose to drink alcohol, do not exceed 1 drink per day. One drink is considered to be 12 ounces (355 mL) of beer, 5 ounces (148 mL) of wine, or 1.5 ounces (44 mL) of liquor.  Avoid use of street drugs. Do not share needles with anyone. Ask for help if you need support or instructions about stopping the use of drugs.  High blood pressure causes heart disease and increases the risk of stroke. Your blood pressure should be checked at least every 1 to 2 years. Ongoing high blood pressure should be treated with medicines if weight loss and exercise are not effective.  If you are 55 to 43 years old, ask your caregiver if you should take aspirin to prevent strokes.  Diabetes   screening involves taking a blood sample to check your fasting blood sugar level. This should be done once every 3 years, after age 45, if you are within normal weight and without risk factors for diabetes. Testing should be considered at a younger age or be carried out more frequently if you are overweight and have at least 1 risk factor for diabetes.  Breast cancer screening is essential preventive care for women. You should practice "breast  self-awareness." This means understanding the normal appearance and feel of your breasts and may include breast self-examination. Any changes detected, no matter how small, should be reported to a caregiver. Women in their 20s and 30s should have a clinical breast exam (CBE) by a caregiver as part of a regular health exam every 1 to 3 years. After age 40, women should have a CBE every year. Starting at age 40, women should consider having a mammography (breast X-ray test) every year. Women who have a family history of breast cancer should talk to their caregiver about genetic screening. Women at a high risk of breast cancer should talk to their caregivers about having magnetic resonance imaging (MRI) and a mammography every year.  The Pap test is a screening test for cervical cancer. A Pap test can show cell changes on the cervix that might become cervical cancer if left untreated. A Pap test is a procedure in which cells are obtained and examined from the lower end of the uterus (cervix).  Women should have a Pap test starting at age 21.  Between ages 21 and 29, Pap tests should be repeated every 2 years.  Beginning at age 30, you should have a Pap test every 3 years as long as the past 3 Pap tests have been normal.  Some women have medical problems that increase the chance of getting cervical cancer. Talk to your caregiver about these problems. It is especially important to talk to your caregiver if a new problem develops soon after your last Pap test. In these cases, your caregiver may recommend more frequent screening and Pap tests.  The above recommendations are the same for women who have or have not gotten the vaccine for human papillomavirus (HPV).  If you had a hysterectomy for a problem that was not cancer or a condition that could lead to cancer, then you no longer need Pap tests. Even if you no longer need a Pap test, a regular exam is a good idea to make sure no other problems are  starting.  If you are between ages 65 and 70, and you have had normal Pap tests going back 10 years, you no longer need Pap tests. Even if you no longer need a Pap test, a regular exam is a good idea to make sure no other problems are starting.  If you have had past treatment for cervical cancer or a condition that could lead to cancer, you need Pap tests and screening for cancer for at least 20 years after your treatment.  If Pap tests have been discontinued, risk factors (such as a new sexual partner) need to be reassessed to determine if screening should be resumed.  The HPV test is an additional test that may be used for cervical cancer screening. The HPV test looks for the virus that can cause the cell changes on the cervix. The cells collected during the Pap test can be tested for HPV. The HPV test could be used to screen women aged 30 years and older, and should   be used in women of any age who have unclear Pap test results. After the age of 30, women should have HPV testing at the same frequency as a Pap test.  Colorectal cancer can be detected and often prevented. Most routine colorectal cancer screening begins at the age of 50 and continues through age 75. However, your caregiver may recommend screening at an earlier age if you have risk factors for colon cancer. On a yearly basis, your caregiver may provide home test kits to check for hidden blood in the stool. Use of a small camera at the end of a tube, to directly examine the colon (sigmoidoscopy or colonoscopy), can detect the earliest forms of colorectal cancer. Talk to your caregiver about this at age 50, when routine screening begins. Direct examination of the colon should be repeated every 5 to 10 years through age 75, unless early forms of pre-cancerous polyps or small growths are found.  Hepatitis C blood testing is recommended for all people born from 1945 through 1965 and any individual with known risks for hepatitis C.  Practice  safe sex. Use condoms and avoid high-risk sexual practices to reduce the spread of sexually transmitted infections (STIs). STIs include gonorrhea, chlamydia, syphilis, trichomonas, herpes, HPV, and human immunodeficiency virus (HIV). Herpes, HIV, and HPV are viral illnesses that have no cure. They can result in disability, cancer, and death. Sexually active women aged 25 and younger should be checked for chlamydia. Older women with new or multiple partners should also be tested for chlamydia. Testing for other STIs is recommended if you are sexually active and at increased risk.  Osteoporosis is a disease in which the bones lose minerals and strength with aging. This can result in serious bone fractures. The risk of osteoporosis can be identified using a bone density scan. Women ages 65 and over and women at risk for fractures or osteoporosis should discuss screening with their caregivers. Ask your caregiver whether you should take a calcium supplement or vitamin D to reduce the rate of osteoporosis.  Menopause can be associated with physical symptoms and risks. Hormone replacement therapy is available to decrease symptoms and risks. You should talk to your caregiver about whether hormone replacement therapy is right for you.  Use sunscreen with sun protection factor (SPF) of 30 or more. Apply sunscreen liberally and repeatedly throughout the day. You should seek shade when your shadow is shorter than you. Protect yourself by wearing long sleeves, pants, a wide-brimmed hat, and sunglasses year round, whenever you are outdoors.  Once a month, do a whole body skin exam, using a mirror to look at the skin on your back. Notify your caregiver of new moles, moles that have irregular borders, moles that are larger than a pencil eraser, or moles that have changed in shape or color.  Stay current with required immunizations.  Influenza. You need a dose every fall (or winter). The composition of the flu vaccine  changes each year, so being vaccinated once is not enough.  Pneumococcal polysaccharide. You need 1 to 2 doses if you smoke cigarettes or if you have certain chronic medical conditions. You need 1 dose at age 65 (or older) if you have never been vaccinated.  Tetanus, diphtheria, pertussis (Tdap, Td). Get 1 dose of Tdap vaccine if you are younger than age 65, are over 65 and have contact with an infant, are a healthcare worker, are pregnant, or simply want to be protected from whooping cough. After that, you need a Td   booster dose every 10 years. Consult your caregiver if you have not had at least 3 tetanus and diphtheria-containing shots sometime in your life or have a deep or dirty wound.  HPV. You need this vaccine if you are a woman age 26 or younger. The vaccine is given in 3 doses over 6 months.  Measles, mumps, rubella (MMR). You need at least 1 dose of MMR if you were born in 1957 or later. You may also need a second dose.  Meningococcal. If you are age 19 to 21 and a first-year college student living in a residence hall, or have one of several medical conditions, you need to get vaccinated against meningococcal disease. You may also need additional booster doses.  Zoster (shingles). If you are age 60 or older, you should get this vaccine.  Varicella (chickenpox). If you have never had chickenpox or you were vaccinated but received only 1 dose, talk to your caregiver to find out if you need this vaccine.  Hepatitis A. You need this vaccine if you have a specific risk factor for hepatitis A virus infection or you simply wish to be protected from this disease. The vaccine is usually given as 2 doses, 6 to 18 months apart.  Hepatitis B. You need this vaccine if you have a specific risk factor for hepatitis B virus infection or you simply wish to be protected from this disease. The vaccine is given in 3 doses, usually over 6 months. Preventive Services / Frequency Ages 19 to 39  Blood  pressure check.** / Every 1 to 2 years.  Lipid and cholesterol check.** / Every 5 years beginning at age 20.  Clinical breast exam.** / Every 3 years for women in their 20s and 30s.  Pap test.** / Every 2 years from ages 21 through 29. Every 3 years starting at age 30 through age 65 or 70 with a history of 3 consecutive normal Pap tests.  HPV screening.** / Every 3 years from ages 30 through ages 65 to 70 with a history of 3 consecutive normal Pap tests.  Hepatitis C blood test.** / For any individual with known risks for hepatitis C.  Skin self-exam. / Monthly.  Influenza immunization.** / Every year.  Pneumococcal polysaccharide immunization.** / 1 to 2 doses if you smoke cigarettes or if you have certain chronic medical conditions.  Tetanus, diphtheria, pertussis (Tdap, Td) immunization. / A one-time dose of Tdap vaccine. After that, you need a Td booster dose every 10 years.  HPV immunization. / 3 doses over 6 months, if you are 26 and younger.  Measles, mumps, rubella (MMR) immunization. / You need at least 1 dose of MMR if you were born in 1957 or later. You may also need a second dose.  Meningococcal immunization. / 1 dose if you are age 19 to 21 and a first-year college student living in a residence hall, or have one of several medical conditions, you need to get vaccinated against meningococcal disease. You may also need additional booster doses.  Varicella immunization.** / Consult your caregiver.  Hepatitis A immunization.** / Consult your caregiver. 2 doses, 6 to 18 months apart.  Hepatitis B immunization.** / Consult your caregiver. 3 doses usually over 6 months. Ages 40 to 64  Blood pressure check.** / Every 1 to 2 years.  Lipid and cholesterol check.** / Every 5 years beginning at age 20.  Clinical breast exam.** / Every year after age 40.  Mammogram.** / Every year beginning at age 40   and continuing for as long as you are in good health. Consult with your  caregiver.  Pap test.** / Every 3 years starting at age 30 through age 65 or 70 with a history of 3 consecutive normal Pap tests.  HPV screening.** / Every 3 years from ages 30 through ages 65 to 70 with a history of 3 consecutive normal Pap tests.  Fecal occult blood test (FOBT) of stool. / Every year beginning at age 50 and continuing until age 75. You may not need to do this test if you get a colonoscopy every 10 years.  Flexible sigmoidoscopy or colonoscopy.** / Every 5 years for a flexible sigmoidoscopy or every 10 years for a colonoscopy beginning at age 50 and continuing until age 75.  Hepatitis C blood test.** / For all people born from 1945 through 1965 and any individual with known risks for hepatitis C.  Skin self-exam. / Monthly.  Influenza immunization.** / Every year.  Pneumococcal polysaccharide immunization.** / 1 to 2 doses if you smoke cigarettes or if you have certain chronic medical conditions.  Tetanus, diphtheria, pertussis (Tdap, Td) immunization.** / A one-time dose of Tdap vaccine. After that, you need a Td booster dose every 10 years.  Measles, mumps, rubella (MMR) immunization. / You need at least 1 dose of MMR if you were born in 1957 or later. You may also need a second dose.  Varicella immunization.** / Consult your caregiver.  Meningococcal immunization.** / Consult your caregiver.  Hepatitis A immunization.** / Consult your caregiver. 2 doses, 6 to 18 months apart.  Hepatitis B immunization.** / Consult your caregiver. 3 doses, usually over 6 months. Ages 65 and over  Blood pressure check.** / Every 1 to 2 years.  Lipid and cholesterol check.** / Every 5 years beginning at age 20.  Clinical breast exam.** / Every year after age 40.  Mammogram.** / Every year beginning at age 40 and continuing for as long as you are in good health. Consult with your caregiver.  Pap test.** / Every 3 years starting at age 30 through age 65 or 70 with a 3  consecutive normal Pap tests. Testing can be stopped between 65 and 70 with 3 consecutive normal Pap tests and no abnormal Pap or HPV tests in the past 10 years.  HPV screening.** / Every 3 years from ages 30 through ages 65 or 70 with a history of 3 consecutive normal Pap tests. Testing can be stopped between 65 and 70 with 3 consecutive normal Pap tests and no abnormal Pap or HPV tests in the past 10 years.  Fecal occult blood test (FOBT) of stool. / Every year beginning at age 50 and continuing until age 75. You may not need to do this test if you get a colonoscopy every 10 years.  Flexible sigmoidoscopy or colonoscopy.** / Every 5 years for a flexible sigmoidoscopy or every 10 years for a colonoscopy beginning at age 50 and continuing until age 75.  Hepatitis C blood test.** / For all people born from 1945 through 1965 and any individual with known risks for hepatitis C.  Osteoporosis screening.** / A one-time screening for women ages 65 and over and women at risk for fractures or osteoporosis.  Skin self-exam. / Monthly.  Influenza immunization.** / Every year.  Pneumococcal polysaccharide immunization.** / 1 dose at age 65 (or older) if you have never been vaccinated.  Tetanus, diphtheria, pertussis (Tdap, Td) immunization. / A one-time dose of Tdap vaccine if you are over   65 and have contact with an infant, are a healthcare worker, or simply want to be protected from whooping cough. After that, you need a Td booster dose every 10 years.  Varicella immunization.** / Consult your caregiver.  Meningococcal immunization.** / Consult your caregiver.  Hepatitis A immunization.** / Consult your caregiver. 2 doses, 6 to 18 months apart.  Hepatitis B immunization.** / Check with your caregiver. 3 doses, usually over 6 months. ** Family history and personal history of risk and conditions may change your caregiver's recommendations. Document Released: 05/11/2001 Document Revised: 06/07/2011  Document Reviewed: 08/10/2010 [employer] Patient Information 2014 [employer].  

## 2013-01-11 ENCOUNTER — Other Ambulatory Visit: Payer: Self-pay

## 2013-01-11 ENCOUNTER — Other Ambulatory Visit: Payer: Self-pay | Admitting: Family Medicine

## 2013-01-11 DIAGNOSIS — M2550 Pain in unspecified joint: Secondary | ICD-10-CM

## 2013-01-11 DIAGNOSIS — R768 Other specified abnormal immunological findings in serum: Secondary | ICD-10-CM

## 2013-01-11 DIAGNOSIS — R7689 Other specified abnormal immunological findings in serum: Secondary | ICD-10-CM

## 2013-01-11 LAB — ANA: Anti Nuclear Antibody(ANA): POSITIVE — AB

## 2013-01-11 LAB — RHEUMATOID FACTOR: Rhuematoid fact SerPl-aCnc: <10 IU/mL (ref <=14)

## 2013-01-11 LAB — ANTI-NUCLEAR AB-TITER (ANA TITER)

## 2013-01-12 ENCOUNTER — Telehealth: Payer: Self-pay | Admitting: Family Medicine

## 2013-01-12 NOTE — Telephone Encounter (Signed)
Patient states she would like someone to go over her lab results with her. I called her to discuss Rheumatology referral and she did not know why she was being referred. If you speak with patient, will you please let her know that the office we are referring to (Dr. Kellie Simmering) is closed on Fridays, so it will be Monday before I call her with appointment. Or you can transfer her to me and I can explain. Thanks.

## 2013-01-12 NOTE — Telephone Encounter (Signed)
Spoke with patient and she is has been made aware of the +ANA and + ANA titer. She is ok with the Rheumatology appointment.    KP

## 2013-01-14 LAB — VITAMIN D 1,25 DIHYDROXY: Vitamin D 1, 25 (OH)2 Total: 109 pg/mL — ABNORMAL HIGH (ref 18–72)

## 2013-01-15 ENCOUNTER — Telehealth: Payer: Self-pay | Admitting: Family Medicine

## 2013-01-15 NOTE — Telephone Encounter (Signed)
Shelley Norman is in the training class. I will forward to [facility] and [place].      KP

## 2013-01-15 NOTE — Telephone Encounter (Signed)
Patient called about that she would like her rheumatology referral to be expedited if possible. Thanks

## 2013-03-09 LAB — HM PAP SMEAR

## 2013-10-17 ENCOUNTER — Other Ambulatory Visit: Payer: Self-pay | Admitting: Obstetrics and Gynecology

## 2013-10-17 DIAGNOSIS — D259 Leiomyoma of uterus, unspecified: Secondary | ICD-10-CM

## 2013-10-18 ENCOUNTER — Other Ambulatory Visit: Payer: Self-pay | Admitting: Obstetrics and Gynecology

## 2013-10-18 DIAGNOSIS — D259 Leiomyoma of uterus, unspecified: Secondary | ICD-10-CM

## 2013-11-16 ENCOUNTER — Ambulatory Visit
Admission: RE | Admit: 2013-11-16 | Discharge: 2013-11-16 | Disposition: A | Discharge status: Home / Self Care | Payer: BC Managed Care – PPO | Source: Ambulatory Visit | Attending: Obstetrics and Gynecology | Admitting: Obstetrics and Gynecology

## 2013-11-16 DIAGNOSIS — D259 Leiomyoma of uterus, unspecified: Secondary | ICD-10-CM

## 2013-11-16 MED ORDER — GADOBENATE DIMEGLUMINE 529 MG/ML IV SOLN
15.0000 mL | Freq: Once | INTRAVENOUS | Status: AC | PRN
  Administered 2013-11-16: 15 mL via INTRAVENOUS

## 2013-12-12 ENCOUNTER — Other Ambulatory Visit: Payer: Self-pay

## 2013-12-12 DIAGNOSIS — Z1231 Encounter for screening mammogram for malignant neoplasm of breast: Secondary | ICD-10-CM

## 2013-12-18 ENCOUNTER — Other Ambulatory Visit (HOSPITAL_COMMUNITY): Payer: Self-pay | Admitting: Obstetrics and Gynecology

## 2013-12-18 NOTE — H&P (Signed)
Shelley Norman is a 43 y.o.  G 0 for myomectomy because of symptomatic uterine fibroids and menorrhagia. Patient's menstrual flow lasts for 6 days with a super long pad change twice a day.  She will occasionally have cramps,  rated at a 5/10 on a 10 point pain scale,  but no inter-menstrual bleeding,  or changes in bowel or bladder function.  Has lower back pain,  attributed to an old injury  and has  noticed an increase in the size of her abdomen in spite of exercise that has caused her clothes not to fit.  Patient has known that she had  fibroids since 2011 when #5 fibroids were noted on an ultrasound with the largest measuring 6.8 cm.  In July 2015 ultrasound,  her uterus measured:  15.4 x 10.2 x 14.2 cm, endometrium could not be discerned;  #2 intramural fibroids measuring:  anteriorly-11.6 x 8.6 x 9.6 cm,   posteriorly-4.8 x 3.9 x 4.6 cm and  #2 sub-serosal  anteriorly- 4.9 x 4.0 x 3.3 cm and 4.4 x 3.3 x 4.5 cm respectively;  the patient's ovaries were not seen on that study. A follow up MRI of abdomen and pelvis  in August 2015 measured the uterus at 14.3 x 9.4 x 16.7 cm with the largest fibroid measuring 10.4 x 9.2 x 10. 4 cm and a partially pedunculated fibroid arising from the lower uterine segment measuring 3.9 cm.  A review of both medical and surgical management options were given to the patient whose primary concern is the mass effect of her fibroids.  Considering that she wants to preserve her childbearing potential, she has decided to proceed with an abdominal myomectomy.   Past Medical History  OB History: [id number]   GYN History: menarche: 43 YO   LMP: 12/03/2013    Contracepton abstinence  The patient denies history of sexually transmitted disease.  Denies history of abnormal PAP smear  Last PAP smear: 02/2013  Medical History: Vitamin D Deficiency,  Osteoarthritis, Vaginismus, Uterine Fibroids and Allergic Rhinitis  Surgical History: 2009-2010  Bilateral Foot Surgery Denies problems with  anesthesia or history of blood transfusions  Family History: Hypertension, Diabetes Mellitus,  Hyperlipidemia, Osteoporosis, Osteoarthritis, Breast Cancer and Seizures  Social History: Single and Unemployed;   Denies Tobacco Use and Occasionally Uses Alcohol   Medication:  Biotin daily Ortho Tri Cyclen daily Fish Oil daily Glucosamine-Chondroitin  prn Magnesium  prn  NKDA  Denies sensitivity to peanuts, shellfish, soy, latex or adhesives.   ROS: Admits to corrective lenses, right knee swelling and bilateral foot swelling on occasion but   denies headache, vision changes, nasal congestion, dysphagia, tinnitus, dizziness, hoarseness, cough,  chest pain, shortness of breath, nausea, vomiting, diarrhea,constipation,  urinary frequency, urgency  dysuria, hematuria, vaginitis symptoms, pelvic pain, easy bruising,  myalgias, skin rashes, unexplained weight loss and except as is mentioned in the history of present illness, patient's review of systems is otherwise negative.  Physical Exam  Bp: 100/70  P: 76   R: 18 Temperature: 98.6 degrees F orally  Weight: 157.5 lbs.  Height: 5\' 2"   BMI: 28.8  Neck: supple without masses or thyromegaly Lungs: clear to auscultation Heart: regular rate and rhythm Abdomen: non-tender with firm mass from pelvis to 2 fingers below the umbilicus Pelvic:EGBUS- wnl; vagina-normal rugae; uterus-20-22 weeks size, irregular and non-tender, cervix without lesions or motion tenderness; adnexae-no tenderness with limited exam due to mass effect of uterus Extremities:  no clubbing, cyanosis or edema   Assesment:  Large Fibroids            Menorrhagia   Disposition:  A discussion was held with the patient regarding  the risks of surgery to include, but not limited to: reaction to anesthesia, damage to adjacent organs, infection, excessive bleeding and the possible need for a C-section  delivery if the endometrial cavity is breached during the procedure. The patient  verbalized understanding of these risks and has consented to proceed with an Abdominal Myomectomy at [place] on January 08, 2014.  CSN# [id number]   Shelley Norman J. Florene Glen, PA-C  for Dr. Seymour Bars. [place]

## 2013-12-24 ENCOUNTER — Encounter (HOSPITAL_COMMUNITY)
Admission: RE | Admit: 2013-12-24 | Discharge: 2013-12-24 | Disposition: A | Discharge status: Home / Self Care | Payer: BC Managed Care – PPO | Source: Ambulatory Visit | Attending: Obstetrics and Gynecology | Admitting: Obstetrics and Gynecology

## 2013-12-24 ENCOUNTER — Encounter (HOSPITAL_COMMUNITY): Payer: Self-pay

## 2013-12-24 DIAGNOSIS — D259 Leiomyoma of uterus, unspecified: Secondary | ICD-10-CM | POA: Insufficient documentation

## 2013-12-24 DIAGNOSIS — Z01812 Encounter for preprocedural laboratory examination: Secondary | ICD-10-CM | POA: Insufficient documentation

## 2013-12-24 HISTORY — DX: Benign neoplasm of connective and other soft tissue, unspecified: D21.9

## 2013-12-24 HISTORY — DX: Nausea with vomiting, unspecified: R11.2

## 2013-12-24 HISTORY — DX: Other specified postprocedural states: Z98.890

## 2013-12-24 LAB — CBC
HEMATOCRIT: 36 % (ref 36.0–46.0)
Hemoglobin: 12.5 g/dL (ref 12.0–15.0)
MCH: 30.9 pg (ref 26.0–34.0)
MCHC: 34.7 g/dL (ref 30.0–36.0)
MCV: 89.1 fL (ref 78.0–100.0)
Platelets: 258 10*3/uL (ref 150–400)
RBC: 4.04 MIL/uL (ref 3.87–5.11)
RDW: 12.6 % (ref 11.5–15.5)
WBC: 3.5 10*3/uL — ABNORMAL LOW (ref 4.0–10.5)

## 2013-12-24 LAB — TYPE AND SCREEN
ABO/RH(D): A POS
Antibody Screen: NEGATIVE

## 2013-12-24 LAB — ABO/RH: ABO/RH(D): A POS

## 2013-12-24 NOTE — Patient Instructions (Addendum)
   Your procedure is scheduled on:  Tuesday, OCT 13  Enter through the [facility] at: 8 am Pick up the phone at the desk and dial [phone] and inform us of your arrival.  Please call this number if you have any problems the morning of surgery: [phone]  Remember: Do not eat OR DRINK after midnight: MONDAY Take these medicines the morning of surgery with a SIP OF WATER: birth control  Do not wear jewelry, make-up, or FINGER nail polish No metal in your hair or on your body. Do not wear lotions, powders, perfumes.  You may wear deodorant.  Do not bring valuables to the hospital. Contacts, dentures or bridgework may not be worn into surgery.  Leave suitcase in the car. After Surgery it may be brought to your room. For patients being admitted to the hospital, checkout time is 11:00am the day of discharge.  HOME WITH NEIGHBOR PAT.

## 2013-12-25 ENCOUNTER — Ambulatory Visit
Admission: RE | Admit: 2013-12-25 | Discharge: 2013-12-25 | Disposition: A | Discharge status: Home / Self Care | Payer: BC Managed Care – PPO | Source: Ambulatory Visit

## 2013-12-25 DIAGNOSIS — Z1231 Encounter for screening mammogram for malignant neoplasm of breast: Secondary | ICD-10-CM

## 2013-12-26 ENCOUNTER — Encounter (HOSPITAL_COMMUNITY): Payer: Self-pay | Admitting: Pharmacist

## 2013-12-27 ENCOUNTER — Other Ambulatory Visit: Payer: Self-pay | Admitting: Obstetrics and Gynecology

## 2013-12-28 ENCOUNTER — Telehealth: Payer: Self-pay | Admitting: *Deleted

## 2013-12-28 ENCOUNTER — Ambulatory Visit: Payer: BC Managed Care – PPO | Admitting: Family Medicine

## 2013-12-28 NOTE — Telephone Encounter (Signed)
No charge but please call

## 2013-12-28 NOTE — Telephone Encounter (Signed)
Pt did not show for appointment 12/28/2013 at 2:30pm for CPE, but has rescheduled CPE for 04/02/2014 at 8:30am.  The appointment was rescheduled at 3:06pm on 12/28/2013.

## 2013-12-31 ENCOUNTER — Ambulatory Visit (INDEPENDENT_AMBULATORY_CARE_PROVIDER_SITE_OTHER): Payer: BC Managed Care – PPO | Admitting: *Deleted

## 2013-12-31 DIAGNOSIS — Z23 Encounter for immunization: Secondary | ICD-10-CM

## 2013-12-31 NOTE — Telephone Encounter (Signed)
No charge, see note below

## 2014-01-07 MED ORDER — DEXTROSE 5 % IV SOLN
2.0000 g | INTRAVENOUS | Status: AC
  Administered 2014-01-08: 2 g via INTRAVENOUS
  Filled 2014-01-07: qty 2

## 2014-01-08 ENCOUNTER — Encounter (HOSPITAL_COMMUNITY): Payer: BC Managed Care – PPO | Admitting: Anesthesiology

## 2014-01-08 ENCOUNTER — Inpatient Hospital Stay (HOSPITAL_COMMUNITY): Payer: BC Managed Care – PPO | Admitting: Anesthesiology

## 2014-01-08 ENCOUNTER — Inpatient Hospital Stay (HOSPITAL_COMMUNITY)
Admission: RE | Admit: 2014-01-08 | Discharge: 2014-01-10 | DRG: 743 | Disposition: A | Discharge status: Home / Self Care | Payer: BC Managed Care – PPO | Source: Ambulatory Visit | Attending: Obstetrics and Gynecology | Admitting: Obstetrics and Gynecology

## 2014-01-08 ENCOUNTER — Encounter (HOSPITAL_COMMUNITY): Payer: Self-pay | Admitting: Anesthesiology

## 2014-01-08 ENCOUNTER — Encounter (HOSPITAL_COMMUNITY)
Admission: RE | Disposition: A | Discharge status: Home / Self Care | Payer: Self-pay | Source: Ambulatory Visit | Attending: Obstetrics and Gynecology

## 2014-01-08 DIAGNOSIS — D251 Intramural leiomyoma of uterus: Principal | ICD-10-CM | POA: Diagnosis present

## 2014-01-08 DIAGNOSIS — D649 Anemia, unspecified: Secondary | ICD-10-CM | POA: Diagnosis present

## 2014-01-08 DIAGNOSIS — D259 Leiomyoma of uterus, unspecified: Secondary | ICD-10-CM | POA: Diagnosis present

## 2014-01-08 DIAGNOSIS — D219 Benign neoplasm of connective and other soft tissue, unspecified: Secondary | ICD-10-CM | POA: Diagnosis present

## 2014-01-08 DIAGNOSIS — N852 Hypertrophy of uterus: Secondary | ICD-10-CM | POA: Diagnosis present

## 2014-01-08 DIAGNOSIS — D252 Subserosal leiomyoma of uterus: Secondary | ICD-10-CM | POA: Diagnosis present

## 2014-01-08 HISTORY — PX: MYOMECTOMY: SHX85

## 2014-01-08 LAB — PREGNANCY, URINE: Preg Test, Ur: NEGATIVE

## 2014-01-08 LAB — CBC
HEMATOCRIT: 34.6 % — AB (ref 36.0–46.0)
HEMOGLOBIN: 11.7 g/dL — AB (ref 12.0–15.0)
MCH: 29.8 pg (ref 26.0–34.0)
MCHC: 33.8 g/dL (ref 30.0–36.0)
MCV: 88.3 fL (ref 78.0–100.0)
Platelets: 222 10*3/uL (ref 150–400)
RBC: 3.92 MIL/uL (ref 3.87–5.11)
RDW: 12.4 % (ref 11.5–15.5)
WBC: 4.7 10*3/uL (ref 4.0–10.5)

## 2014-01-08 LAB — BASIC METABOLIC PANEL
Anion gap: 13 (ref 5–15)
BUN: 15 mg/dL (ref 6–23)
CO2: 23 meq/L (ref 19–32)
Calcium: 9.2 mg/dL (ref 8.4–10.5)
Chloride: 103 mEq/L (ref 96–112)
Creatinine, Ser: 1.29 mg/dL — ABNORMAL HIGH (ref 0.50–1.10)
GFR calc Af Amer: 57 mL/min — ABNORMAL LOW (ref >90)
GFR calc non Af Amer: 50 mL/min — ABNORMAL LOW (ref >90)
GLUCOSE: 84 mg/dL (ref 70–99)
POTASSIUM: 4.2 meq/L (ref 3.7–5.3)
Sodium: 139 mEq/L (ref 137–147)

## 2014-01-08 LAB — TYPE AND SCREEN
ABO/RH(D): A POS
ANTIBODY SCREEN: NEGATIVE

## 2014-01-08 SURGERY — MYOMECTOMY, ABDOMINAL APPROACH
Anesthesia: General | Site: Abdomen

## 2014-01-08 MED ORDER — FENTANYL CITRATE 0.05 MG/ML IJ SOLN
INTRAMUSCULAR | Status: AC
  Filled 2014-01-08: qty 5

## 2014-01-08 MED ORDER — DEXAMETHASONE SODIUM PHOSPHATE 4 MG/ML IJ SOLN
INTRAMUSCULAR | Status: DC | PRN
  Administered 2014-01-08: 10 mg via INTRAVENOUS

## 2014-01-08 MED ORDER — HYDROMORPHONE HCL 1 MG/ML IJ SOLN
INTRAMUSCULAR | Status: AC
  Filled 2014-01-08: qty 1

## 2014-01-08 MED ORDER — LACTATED RINGERS IV BOLUS (SEPSIS)
2000.0000 mL | Freq: Once | INTRAVENOUS | Status: AC
  Administered 2014-01-08 (×3): 1000 mL via INTRAVENOUS

## 2014-01-08 MED ORDER — HEPARIN SODIUM (PORCINE) 5000 UNIT/ML IJ SOLN
INTRAMUSCULAR | Status: DC | PRN
  Administered 2014-01-08: 5000 [IU]

## 2014-01-08 MED ORDER — HEPARIN SODIUM (PORCINE) 5000 UNIT/ML IJ SOLN
INTRAMUSCULAR | Status: AC
  Filled 2014-01-08: qty 1

## 2014-01-08 MED ORDER — MEPERIDINE HCL 25 MG/ML IJ SOLN: 6.2500 mg | INTRAMUSCULAR | Status: DC | PRN

## 2014-01-08 MED ORDER — PROPOFOL INFUSION 10 MG/ML OPTIME
INTRAVENOUS | Status: DC | PRN
  Administered 2014-01-08: 20 mL via INTRAVENOUS
  Administered 2014-01-08: 180 mL via INTRAVENOUS

## 2014-01-08 MED ORDER — SODIUM CHLORIDE 0.9 % IJ SOLN
INTRAMUSCULAR | Status: AC
  Filled 2014-01-08: qty 100

## 2014-01-08 MED ORDER — VASOPRESSIN 20 UNIT/ML IJ SOLN
INTRAVENOUS | Status: DC | PRN
  Administered 2014-01-08: 11:00:00 via INTRAMUSCULAR

## 2014-01-08 MED ORDER — KETOROLAC TROMETHAMINE 30 MG/ML IJ SOLN
INTRAMUSCULAR | Status: AC
Start: 2014-01-08 — End: 2014-01-09
  Filled 2014-01-08: qty 1

## 2014-01-08 MED ORDER — DIPHENHYDRAMINE HCL 50 MG/ML IJ SOLN: 12.5000 mg | Freq: Four times a day (QID) | INTRAMUSCULAR | Status: DC | PRN

## 2014-01-08 MED ORDER — OXYCODONE-ACETAMINOPHEN 5-325 MG PO TABS
1.0000 | ORAL_TABLET | ORAL | Status: DC | PRN
  Administered 2014-01-09: 2 via ORAL
  Administered 2014-01-09 (×2): 1 via ORAL
  Administered 2014-01-09: 2 via ORAL
  Administered 2014-01-10 (×2): 1 via ORAL
  Filled 2014-01-08: qty 1
  Filled 2014-01-08: qty 2
  Filled 2014-01-08: qty 1
  Filled 2014-01-08: qty 2
  Filled 2014-01-08: qty 1
  Filled 2014-01-08: qty 2
  Filled 2014-01-08: qty 1

## 2014-01-08 MED ORDER — MIDAZOLAM HCL 5 MG/5ML IJ SOLN
INTRAMUSCULAR | Status: DC | PRN
  Administered 2014-01-08: 2 mg via INTRAVENOUS

## 2014-01-08 MED ORDER — PROPOFOL 10 MG/ML IV EMUL
INTRAVENOUS | Status: AC
  Filled 2014-01-08: qty 20

## 2014-01-08 MED ORDER — SCOPOLAMINE 1 MG/3DAYS TD PT72
MEDICATED_PATCH | TRANSDERMAL | Status: AC
  Administered 2014-01-08: 1.5 mg via TRANSDERMAL
  Filled 2014-01-08: qty 1

## 2014-01-08 MED ORDER — HYDROMORPHONE HCL 1 MG/ML IJ SOLN
0.2500 mg | INTRAMUSCULAR | Status: DC | PRN
  Administered 2014-01-08 (×3): 0.5 mg via INTRAVENOUS

## 2014-01-08 MED ORDER — NALOXONE HCL 0.4 MG/ML IJ SOLN: 0.4000 mg | INTRAMUSCULAR | Status: DC | PRN

## 2014-01-08 MED ORDER — DEXAMETHASONE SODIUM PHOSPHATE 10 MG/ML IJ SOLN
INTRAMUSCULAR | Status: AC
  Filled 2014-01-08: qty 1

## 2014-01-08 MED ORDER — LACTATED RINGERS IV SOLN
INTRAVENOUS | Status: DC
  Administered 2014-01-08 (×2): via INTRAVENOUS

## 2014-01-08 MED ORDER — FENTANYL CITRATE 0.05 MG/ML IJ SOLN
INTRAMUSCULAR | Status: DC | PRN
  Administered 2014-01-08 (×2): 100 ug via INTRAVENOUS

## 2014-01-08 MED ORDER — NEOSTIGMINE METHYLSULFATE 10 MG/10ML IV SOLN
INTRAVENOUS | Status: DC | PRN
  Administered 2014-01-08 (×2): 2 mg via INTRAVENOUS

## 2014-01-08 MED ORDER — ONDANSETRON HCL 4 MG PO TABS
4.0000 mg | ORAL_TABLET | Freq: Three times a day (TID) | ORAL | Status: DC | PRN
  Administered 2014-01-09: 4 mg via ORAL
  Filled 2014-01-08: qty 1

## 2014-01-08 MED ORDER — NEOSTIGMINE METHYLSULFATE 10 MG/10ML IV SOLN
INTRAVENOUS | Status: AC
  Filled 2014-01-08: qty 1

## 2014-01-08 MED ORDER — MENTHOL 3 MG MT LOZG: 1.0000 | LOZENGE | OROMUCOSAL | Status: DC | PRN

## 2014-01-08 MED ORDER — KETOROLAC TROMETHAMINE 30 MG/ML IJ SOLN: 15.0000 mg | Freq: Once | INTRAMUSCULAR | Status: DC | PRN

## 2014-01-08 MED ORDER — LACTATED RINGERS IV SOLN
INTRAVENOUS | Status: DC
  Administered 2014-01-08 (×2): via INTRAVENOUS

## 2014-01-08 MED ORDER — BUPIVACAINE HCL (PF) 0.25 % IJ SOLN
INTRAMUSCULAR | Status: DC | PRN
  Administered 2014-01-08: 20 mL
  Administered 2014-01-08: 10 mL

## 2014-01-08 MED ORDER — LIDOCAINE HCL (CARDIAC) 20 MG/ML IV SOLN
INTRAVENOUS | Status: AC
  Filled 2014-01-08: qty 5

## 2014-01-08 MED ORDER — KETOROLAC TROMETHAMINE 30 MG/ML IJ SOLN
30.0000 mg | Freq: Four times a day (QID) | INTRAMUSCULAR | Status: DC
Start: 2014-01-08 — End: 2014-01-08
  Administered 2014-01-08: 30 mg via INTRAVENOUS

## 2014-01-08 MED ORDER — LACTATED RINGERS IR SOLN
Status: DC | PRN
  Administered 2014-01-08: 1000 mL

## 2014-01-08 MED ORDER — BENZOCAINE-MENTHOL 20-0.5 % EX AERO: 1.0000 "application " | INHALATION_SPRAY | Freq: Four times a day (QID) | CUTANEOUS | Status: DC | PRN

## 2014-01-08 MED ORDER — ONDANSETRON HCL 4 MG/2ML IJ SOLN
INTRAMUSCULAR | Status: DC | PRN
  Administered 2014-01-08: 4 mg via INTRAVENOUS

## 2014-01-08 MED ORDER — ONDANSETRON HCL 4 MG/2ML IJ SOLN
4.0000 mg | Freq: Four times a day (QID) | INTRAMUSCULAR | Status: DC | PRN
  Administered 2014-01-08: 4 mg via INTRAVENOUS
  Filled 2014-01-08: qty 2

## 2014-01-08 MED ORDER — HYDROMORPHONE HCL 1 MG/ML IJ SOLN
INTRAMUSCULAR | Status: AC
  Administered 2014-01-08: 0.5 mg via INTRAVENOUS
  Filled 2014-01-08: qty 1

## 2014-01-08 MED ORDER — GLYCOPYRROLATE 0.2 MG/ML IJ SOLN
INTRAMUSCULAR | Status: DC | PRN
  Administered 2014-01-08: .4 mg via INTRAVENOUS
  Administered 2014-01-08: 0.4 mg via INTRAVENOUS
  Administered 2014-01-08: 0.2 mg via INTRAVENOUS

## 2014-01-08 MED ORDER — MIDAZOLAM HCL 2 MG/2ML IJ SOLN
INTRAMUSCULAR | Status: AC
  Filled 2014-01-08: qty 2

## 2014-01-08 MED ORDER — GLYCOPYRROLATE 0.2 MG/ML IJ SOLN
INTRAMUSCULAR | Status: AC
  Filled 2014-01-08: qty 2

## 2014-01-08 MED ORDER — HYDROMORPHONE HCL 1 MG/ML IJ SOLN
INTRAMUSCULAR | Status: DC | PRN
  Administered 2014-01-08 (×2): 0.5 mg via INTRAVENOUS

## 2014-01-08 MED ORDER — ROCURONIUM BROMIDE 100 MG/10ML IV SOLN
INTRAVENOUS | Status: AC
  Filled 2014-01-08: qty 1

## 2014-01-08 MED ORDER — DIPHENHYDRAMINE HCL 12.5 MG/5ML PO ELIX: 12.5000 mg | ORAL_SOLUTION | Freq: Four times a day (QID) | ORAL | Status: DC | PRN

## 2014-01-08 MED ORDER — PROMETHAZINE HCL 25 MG/ML IJ SOLN: 6.2500 mg | INTRAMUSCULAR | Status: DC | PRN

## 2014-01-08 MED ORDER — VASOPRESSIN 20 UNIT/ML IJ SOLN
INTRAMUSCULAR | Status: AC
  Filled 2014-01-08: qty 1

## 2014-01-08 MED ORDER — SODIUM CHLORIDE 0.9 % IJ SOLN: 9.0000 mL | INTRAMUSCULAR | Status: DC | PRN

## 2014-01-08 MED ORDER — ROCURONIUM BROMIDE 100 MG/10ML IV SOLN
INTRAVENOUS | Status: DC | PRN
  Administered 2014-01-08 (×2): 10 mg via INTRAVENOUS
  Administered 2014-01-08: 40 mg via INTRAVENOUS
  Administered 2014-01-08: 10 mg via INTRAVENOUS

## 2014-01-08 MED ORDER — LACTATED RINGERS IV SOLN: Freq: Once | INTRAVENOUS | Status: DC

## 2014-01-08 MED ORDER — BUPIVACAINE HCL (PF) 0.25 % IJ SOLN
INTRAMUSCULAR | Status: AC
  Filled 2014-01-08: qty 30

## 2014-01-08 MED ORDER — SCOPOLAMINE 1 MG/3DAYS TD PT72
1.0000 | MEDICATED_PATCH | Freq: Once | TRANSDERMAL | Status: DC
  Administered 2014-01-08: 1.5 mg via TRANSDERMAL

## 2014-01-08 MED ORDER — LIDOCAINE HCL (CARDIAC) 20 MG/ML IV SOLN
INTRAVENOUS | Status: DC | PRN
  Administered 2014-01-08: 60 mg via INTRAVENOUS

## 2014-01-08 MED ORDER — HYDROMORPHONE 0.3 MG/ML IV SOLN
INTRAVENOUS | Status: AC
  Administered 2014-01-08: 1 mL via INTRAVENOUS
  Administered 2014-01-08: 14:00:00 via INTRAVENOUS
  Administered 2014-01-09: 0.3 mg via INTRAVENOUS
  Administered 2014-01-09: 0.9 mg via INTRAVENOUS
  Filled 2014-01-08: qty 25

## 2014-01-08 MED ORDER — ONDANSETRON HCL 4 MG/2ML IJ SOLN
INTRAMUSCULAR | Status: AC
  Filled 2014-01-08: qty 2

## 2014-01-08 MED ORDER — IBUPROFEN 600 MG PO TABS
600.0000 mg | ORAL_TABLET | Freq: Four times a day (QID) | ORAL | Status: DC | PRN
  Administered 2014-01-09 – 2014-01-10 (×3): 600 mg via ORAL
  Filled 2014-01-08 (×4): qty 1

## 2014-01-08 SURGICAL SUPPLY — 53 items
ADH SKN CLS APL DERMABOND .7 (GAUZE/BANDAGES/DRESSINGS)
BARRIER ADHS 3X4 INTERCEED (GAUZE/BANDAGES/DRESSINGS) IMPLANT
BLADE EXTENDED COATED 6.5IN (ELECTRODE) IMPLANT
BRR ADH 4X3 ABS CNTRL BYND (GAUZE/BANDAGES/DRESSINGS)
CANISTER SUCT 3000ML (MISCELLANEOUS) ×3 IMPLANT
CHLORAPREP W/TINT 26ML (MISCELLANEOUS) ×3 IMPLANT
CLOTH BEACON ORANGE TIMEOUT ST (SAFETY) ×3 IMPLANT
CONT PATH 16OZ SNAP LID 3702 (MISCELLANEOUS) ×3 IMPLANT
DECANTER SPIKE VIAL GLASS SM (MISCELLANEOUS) ×3 IMPLANT
DERMABOND ADVANCED (GAUZE/BANDAGES/DRESSINGS)
DERMABOND ADVANCED .7 DNX12 (GAUZE/BANDAGES/DRESSINGS) IMPLANT
DRAPE CESAREAN BIRTH W POUCH (DRAPES) ×3 IMPLANT
DRAPE SURG 17X11 SM STRL (DRAPES) ×6 IMPLANT
DRSG OPSITE POSTOP 4X10 (GAUZE/BANDAGES/DRESSINGS) ×3 IMPLANT
ELECT CAUTERY BLADE 6.4 (BLADE) IMPLANT
ELECT NDL TIP 2.8 STRL (NEEDLE) IMPLANT
ELECT NEEDLE TIP 2.8 STRL (NEEDLE) IMPLANT
GAUZE SPONGE 4X4 16PLY XRAY LF (GAUZE/BANDAGES/DRESSINGS) ×6 IMPLANT
GLOVE SURG SS PI 6.5 STRL IVOR (GLOVE) ×6 IMPLANT
GOWN STRL REUS W/TWL LRG LVL3 (GOWN DISPOSABLE) ×9 IMPLANT
NEEDLE HYPO 25X1 1.5 SAFETY (NEEDLE) ×3 IMPLANT
NEEDLE SPNL 22GX3.5 QUINCKE BK (NEEDLE) ×3 IMPLANT
PACK ABDOMINAL GYN (CUSTOM PROCEDURE TRAY) ×3 IMPLANT
PAD ABD 8X7 1/2 STERILE (GAUZE/BANDAGES/DRESSINGS) ×3 IMPLANT
PAD OB MATERNITY 4.3X12.25 (PERSONAL CARE ITEMS) ×3 IMPLANT
RINGERS IRRIG 1000ML POUR BTL (IV SOLUTION) ×3 IMPLANT
SPONGE GAUZE 4X4 12PLY STER LF (GAUZE/BANDAGES/DRESSINGS) ×2 IMPLANT
STAPLER VISISTAT 35W (STAPLE) IMPLANT
SUT MNCRL AB 3-0 PS2 27 (SUTURE) IMPLANT
SUT PDS AB 0 CT 36 (SUTURE) IMPLANT
SUT VIC AB 0 CT1 18XCR BRD8 (SUTURE) ×2 IMPLANT
SUT VIC AB 0 CT1 27 (SUTURE) ×6
SUT VIC AB 0 CT1 27XBRD ANBCTR (SUTURE) ×2 IMPLANT
SUT VIC AB 0 CT1 8-18 (SUTURE) ×6
SUT VIC AB 0 CTX 36 (SUTURE) ×3
SUT VIC AB 0 CTX36XBRD ANBCTRL (SUTURE) IMPLANT
SUT VIC AB 2-0 CT1 (SUTURE) ×3 IMPLANT
SUT VIC AB 2-0 CT1 27 (SUTURE) ×3
SUT VIC AB 2-0 CT1 TAPERPNT 27 (SUTURE) ×1 IMPLANT
SUT VIC AB 2-0 UR6 27 (SUTURE) ×12 IMPLANT
SUT VIC AB 3-0 CT1 27 (SUTURE)
SUT VIC AB 3-0 CT1 TAPERPNT 27 (SUTURE) IMPLANT
SUT VIC AB 3-0 SH 27 (SUTURE) ×15
SUT VIC AB 3-0 SH 27X BRD (SUTURE) ×2 IMPLANT
SUT VIC AB 3-0 SH 27XBRD (SUTURE) IMPLANT
SUT VICRYL 0 TIES 12 18 (SUTURE) IMPLANT
SUT VICRYL 2 0 18  UND BR (SUTURE)
SUT VICRYL 2 0 18 UND BR (SUTURE) IMPLANT
SYR CONTROL 10ML LL (SYRINGE) ×6 IMPLANT
TAPE PAPER 2X10 WHT MICROPORE (GAUZE/BANDAGES/DRESSINGS) ×3 IMPLANT
TOWEL OR 17X24 6PK STRL BLUE (TOWEL DISPOSABLE) ×6 IMPLANT
TRAY FOLEY CATH 14FR (SET/KITS/TRAYS/PACK) ×3 IMPLANT
WATER STERILE IRR 1000ML POUR (IV SOLUTION) ×3 IMPLANT

## 2014-01-08 NOTE — Transfer of Care (Signed)
Immediate Anesthesia Transfer of Care Note  Patient: Shelley Norman  Procedure(s) Performed: Procedure(s): Abdominal MYOMECTOMY (N/A)  Patient Location: PACU  Anesthesia Type:General  Level of Consciousness: awake, sedated and patient cooperative  Airway & Oxygen Therapy: Patient Spontanous Breathing and Patient connected to nasal cannula oxygen  Post-op Assessment: Report given to PACU RN and Post -op Vital signs reviewed and stable  Post vital signs: Reviewed and stable  Complications: No apparent anesthesia complications

## 2014-01-08 NOTE — Anesthesia Postprocedure Evaluation (Signed)
  Anesthesia Post Note  Patient: Shelley Norman  Procedure(s) Performed: Procedure(s) (LRB): Abdominal MYOMECTOMY (N/A)  Anesthesia type: GA  Patient location: PACU  Post pain: Pain level controlled  Post assessment: Post-op Vital signs reviewed  Last Vitals:  Filed Vitals:   01/08/14 1245  BP: 107/69  Pulse: 69  Temp:   Resp: 16    Post vital signs: Reviewed  Level of consciousness: sedated  Complications: No apparent anesthesia complications

## 2014-01-08 NOTE — Op Note (Signed)
Pre-operative Diagnosis:Symptomatic uterine fibroids  Post-operative Diagnosis: same  Surgeon: Surgeon(s) and Role:    * Eldred Manges, MD - Primary  First assistant: Earnstine Regal certified physician assistant  Procedure and Anesthesia:  Procedure(s) and Anesthesia Type:    * Abdominal MYOMECTOMY - General  ASA Class: 2  TDV:761 mL  Complications: none  Findings:The uterus was in larged to 22 weeks size with a 12 cm fibroid at the fundus and multiple pedunculated fibroids on the anterior surface. There were likewise several intramural fibroids.  The  ovaries and tubes were normal bilaterally.  Specimens:Multiple fibroids ranging in size from 5 mm to 12 cm  Disposition:Specimens to pathology  Procedure: The patient was taken to the operating room after appropriate identification placed on the operating table An appropriate time out was performed. The abdomen was prepped with Cloraprep and the perineum and vagina prepped with Betadine.  A full of catheter was inserted into the bladder under direct visualization and connected to straight drainage.  The abdomen was draped as a sterile field. The super pubic region was injected with20 mL of 0.25%Marcaine.  A super pubic  transverse incision was made.  The abdomen was open in layers. The peritoneum was entered.  The above findings were made as examination of the pelvis and upper abdomen were undertaken.  . Peritoneal washings were obtained and later discarded. The enlarged uterus was brought into the operative field.  The serosa overlying the largest of the fibroids was injected with a dilute solution of pitressin.  The serosa was incised and a combination of blunt and sharp dissection used  to remove the largest of the fibroids from the operative field.  Several additional fibroids were removed from the uterine wall in a similar fashion through that same incision.The myoma bed was then closed with Multiple figure of eigh tSutures it 0  Vicryl To allow for Adequate Hemostasis as the myometrium was reapproximated. The serosa was closed with a running interlocking suture of 3-0 Vicryl and then an Imbricating stitch of 3-0vicryl allowed for adequate hemostasis. Three anterior fundal pedunculated fibroids were removed in the following manner:The Base of the pedicle was injected with a due solution of the pitressinInterior.  Tonye Pearson of the pedicle was incised and a combination of blood and sharp dissection used to remove each fibroid from its overlying serosa.  The  Base of  the fibroid Was made hemostatic with figure of eight sutures of zero Vicryl  and the serosa imbricated Over that To close the defect.  Two additional subserosal myomas were removed in the following manner:The overlying serosa was infiltrated with a diluted solution of the pitressin. The serosa was incised and the myoma extracted with a combination of blunt and sharp dissection.The serosa Overlying the myoma bed closed withImbricating sutures 30 Vicryl.  Wants all myomas Had been removed and all the suture lines noted to be hemostatic intercede was placed over each suture line after copious irrigation.  The uterus was returned to the peritoneal cavityAnd the peritoneum closed with a running suture of 2-0 Vicryl.  The rectus muscles were irrigated in the rectus stash is closed with running sutures at 0 Vicryl from each apex to the main line and tied in the mid line.The subcutaneous tissue was copiously irrigated and made him a hemostatic with Bovie cautery.  The skin incision was closed with a subcuticular suture of3-0 Monocryl. A sterile dressing was appliedAnd the patient awakened from general anesthesia then taken to the recovery room in satisfactory  condition having tolerated the procedure well with sponge and instrument accounts correct.

## 2014-01-08 NOTE — Addendum Note (Signed)
Addendum created 01/08/14 1705 by Asher Muir, CRNA   Modules edited: Notes Section   Notes Section:  File: [id number]

## 2014-01-08 NOTE — Progress Notes (Signed)
Day of Surgery Procedure(s) (LRB): Abdominal MYOMECTOMY (N/A)  Subjective: Patient reports no nausea, vomiting.   Incisional pain controlled and tolerating PO.    Objective: I have reviewed patient's vital signs, intake and output and medications.  General: alert, cooperative and no distress Resp: clear to auscultation bilaterally Cardio: regular rate and rhythm, S1, S2 normal, no murmur, click, rub or gallop GI: soft, non-tender; bowel sounds normal; no masses,  no organomegaly.  Dressing dry. Extremities: extremities normal, atraumatic, no cyanosis or edema Vaginal Bleeding: none  Assessment: s/p Procedure(s): Abdominal MYOMECTOMY (N/A): stable, progressing well and tolerating diet  Plan: Advance diet Encourage ambulation Advance to PO medication in am  LOS: 0 days    Anyssa Sharpless P 01/08/2014, 4:52 PM

## 2014-01-08 NOTE — Anesthesia Preprocedure Evaluation (Signed)
Anesthesia Evaluation  Patient identified by MRN, date of birth, ID band Patient awake    Reviewed: Allergy & Precautions, H&P , NPO status , Patient's Chart, lab work & pertinent test results, reviewed documented beta blocker date and time   Airway Mallampati: I TM Distance: >3 FB Neck ROM: full    Dental no notable dental hx. (+) Teeth Intact   Pulmonary neg pulmonary ROS,    Pulmonary exam normal       Cardiovascular negative cardio ROS      Neuro/Psych negative neurological ROS  negative psych ROS   GI/Hepatic negative GI ROS, Neg liver ROS,   Endo/Other  negative endocrine ROS  Renal/GU negative Renal ROS     Musculoskeletal   Abdominal Normal abdominal exam  (+)   Peds  Hematology negative hematology ROS (+)   Anesthesia Other Findings   Reproductive/Obstetrics negative OB ROS                           Anesthesia Physical Anesthesia Plan  ASA: II  Anesthesia Plan: General   Post-op Pain Management:    Induction: Intravenous  Airway Management Planned: Oral ETT  Additional Equipment:   Intra-op Plan:   Post-operative Plan: Extubation in OR  Informed Consent: I have reviewed the patients History and Physical, chart, labs and discussed the procedure including the risks, benefits and alternatives for the proposed anesthesia with the patient or authorized representative who has indicated his/her understanding and acceptance.   Dental Advisory Given  Plan Discussed with: CRNA and Surgeon  Anesthesia Plan Comments:         Anesthesia Quick Evaluation

## 2014-01-08 NOTE — Anesthesia Postprocedure Evaluation (Signed)
Anesthesia Post Note  Patient: Shelley Norman  Procedure(s) Performed: Procedure(s) (LRB): Abdominal MYOMECTOMY (N/A)  Anesthesia type: General  Patient location: [facility]  Post pain: Pain level controlled  Post assessment: Post-op Vital signs reviewed  Last Vitals:  Filed Vitals:   01/08/14 1648  BP: 117/82  Pulse: 68  Temp: 37.3 C  Resp: 11    Post vital signs: Reviewed  Level of consciousness: sedated  Complications: No apparent anesthesia complications

## 2014-01-08 NOTE — H&P (Signed)
  History and Physical Interval Note:   01/08/2014   7:27 AM   Shelley Norman  has presented today for surgery, with the diagnosis of Uterine Fibroids  The various methods of treatment have been discussed with the patient and family. After consideration of risks, benefits and other options for treatment, the patient has consented to  Procedure(s): Abdominal MYOMECTOMY as a surgical intervention .  I have reviewed the patients' chart and labs.  Questions were answered to the patient's satisfaction.     Eldred Manges  MD

## 2014-01-09 ENCOUNTER — Encounter (HOSPITAL_COMMUNITY): Payer: Self-pay | Admitting: Obstetrics and Gynecology

## 2014-01-09 LAB — CBC
HEMATOCRIT: 25.9 % — AB (ref 36.0–46.0)
Hemoglobin: 9.2 g/dL — ABNORMAL LOW (ref 12.0–15.0)
MCH: 31.2 pg (ref 26.0–34.0)
MCHC: 35.5 g/dL (ref 30.0–36.0)
MCV: 87.8 fL (ref 78.0–100.0)
Platelets: 181 10*3/uL (ref 150–400)
RBC: 2.95 MIL/uL — AB (ref 3.87–5.11)
RDW: 12.4 % (ref 11.5–15.5)
WBC: 9.2 10*3/uL (ref 4.0–10.5)

## 2014-01-09 MED ORDER — SIMETHICONE 80 MG PO CHEW
80.0000 mg | CHEWABLE_TABLET | Freq: Four times a day (QID) | ORAL | Status: DC | PRN
  Administered 2014-01-09 – 2014-01-10 (×4): 80 mg via ORAL
  Filled 2014-01-09 (×4): qty 1

## 2014-01-09 MED ORDER — GLYCERIN (LAXATIVE) 2.1 G RE SUPP
1.0000 | RECTAL | Status: AC
  Administered 2014-01-09: 1 via RECTAL
  Filled 2014-01-09: qty 1

## 2014-01-09 MED FILL — Heparin Sodium (Porcine) Inj 5000 Unit/ML: INTRAMUSCULAR | Qty: 1 | Status: AC

## 2014-01-09 NOTE — Progress Notes (Signed)
Shelley Norman is a59 y.o.  [id number]  Post Op Date # 1: Abdominal Myomectomy  Subjective: Patient is Doing well postoperatively. Patient has Pain is controlled with current analgesics. Medications being used: prescription NSAID's including ketorolac (Toradol) and narcotic analgesics including Percocet.. Denies any nausea or dizziness. Has been ambulating in the halls and tolerating a regular diet.  Hasn't voided since Foley was removed and only complains of gas pains.   Objective: Vital signs in last 24 hours: Temp:  [98.4 F (36.9 C)-99.1 F (37.3 C)] 98.5 F (36.9 C) (10/14 0600) Pulse Rate:  [67-97] 97 (10/14 0600) Resp:  [11-22] 18 (10/14 0600) BP: (100-117)/(51-82) 100/59 mmHg (10/14 0600) SpO2:  [98 %-100 %] 100 % (10/14 0600) Weight:  [157 lb (71.215 kg)] 157 lb (71.215 kg) (10/13 1835)  Intake/Output from previous day: 10/13 0701 - 10/14 0700 In: 4555 [P.O.:955; I.V.:3600] Out: 3375 [Urine:2775] Intake/Output this shift:    Recent Labs Lab 01/08/14 0659 01/09/14 0515  WBC 4.7 9.2  HGB 11.7* 9.2*  HCT 34.6* 25.9*  PLT 222 181     Recent Labs Lab 01/08/14 0659  NA 139  K 4.2  CL 103  CO2 23  BUN 15  CREATININE 1.29*  CALCIUM 9.2  GLUCOSE 84    EXAM: General: alert, cooperative and no distress Resp: clear to auscultation bilaterally Cardio: regular rate and rhythm, S1, S2 normal, no murmur, click, rub or gallop GI: Decreased bowel sounds, dressing clean/dry/intact Extremities: No calf tenderness & negative Homan's sign   Assessment: s/p Procedure(s): Abdominal MYOMECTOMY: stable, progressing well, tolerating diet and anemia  Plan: Encourage ambulation Routine Care  LOS: 1 day    Whitlee Sluder, PA-C 01/09/2014 7:46 AM

## 2014-01-09 NOTE — Progress Notes (Signed)
1 Day Post-Op Procedure(s) (LRB): Abdominal MYOMECTOMY (N/A)  Subjective: Patient reports nausea only after drinking prune juice, otherwise has none; no vomiting.     incisional pain well releieved by po pain meds, tolerating PO regular diet.  No flatus, or BM and no problems voiding.  All pain now is gas pain  Objective: I have reviewed patient's vital signs, intake and output, medications and labs.  General: alert, cooperative and no distress Resp: clear to auscultation bilaterally Cardio: regular rate and rhythm, S1, S2 normal, no murmur, click, rub or gallop GI: Mild distension.  Pos bowel sounds. Dressing dry Extremities: extremities normal, atraumatic, no cyanosis or edema Vaginal Bleeding: none  Assessment: s/p Procedure(s): Abdominal MYOMECTOMY (N/A): stable and awaitng flatus  Plan: Encourage ambulation Discharge home 10/15  LOS: 1 day    HAYGOOD,VANESSA P 01/09/2014, 1:08 PM

## 2014-01-10 MED ORDER — OXYCODONE-ACETAMINOPHEN 5-325 MG PO TABS: 1.0000 | ORAL_TABLET | ORAL | Status: DC | PRN

## 2014-01-10 MED ORDER — ONDANSETRON HCL 4 MG PO TABS: 4.0000 mg | ORAL_TABLET | Freq: Three times a day (TID) | ORAL | Status: DC | PRN

## 2014-01-10 MED ORDER — IBUPROFEN 600 MG PO TABS
ORAL_TABLET | ORAL | Status: AC
Start: 2014-01-10 — End: ?

## 2014-01-10 NOTE — Progress Notes (Signed)
Pt out in wheelchair teaching complete  

## 2014-01-10 NOTE — Progress Notes (Signed)
Shelley Norman is a51 y.o.  [id number]  Post Op Date # 2:  Abdominal Myomectomy  Subjective: Patient is Doing well postoperatively. Patient has good pain control with oral narcotic and NSAID.  Gas pains now relieved and no further nausea. Ambulating and voiding without difficulty.  Objective: Vital signs in last 24 hours: Temp:  [97.8 F (36.6 C)-98.6 F (37 C)] 97.9 F (36.6 C) (10/15 0519) Pulse Rate:  [72-88] 72 (10/15 0519) Resp:  [18-20] 20 (10/15 0519) BP: (103-117)/(62-73) 103/62 mmHg (10/15 0519) SpO2:  [99 %-100 %] 99 % (10/14 1744)  Intake/Output from previous day: 10/14 0701 - 10/15 0700 In: 360 [P.O.:360] Out: 625 [Urine:625] Intake/Output this shift:    Recent Labs Lab 01/08/14 0659 01/09/14 0515  WBC 4.7 9.2  HGB 11.7* 9.2*  HCT 34.6* 25.9*  PLT 222 181     Recent Labs Lab 01/08/14 0659  NA 139  K 4.2  CL 103  CO2 23  BUN 15  CREATININE 1.29*  CALCIUM 9.2  GLUCOSE 84    EXAM: General: alert, cooperative and no distress Resp: clear to auscultation bilaterally Cardio: regular rate and rhythm, S1, S2 normal, no murmur, click, rub or gallop GI: Bowel sounds present, soft, honeycomb dressing with large dried blood stain on left aspect of dressing that remains intact. Extremities: No calf tenderness.   Assessment: s/p Procedure(s): Abdominal MYOMECTOMY: stable, progressing well and anemia  Plan: Discharge home  LOS: 2 days    Elvin Banker, PA-C 01/10/2014 7:51 AM

## 2014-01-10 NOTE — Discharge Instructions (Signed)
Call [facility] @ [phone] if:  You have a temperature greater than or equal to 100.4 degrees Farenheit orally You have pain that is not made better by the pain medication given and taken as directed You have excessive bleeding or problems urinating  Take Colace (Docusate Sodium/Stool Softener) 100 mg 2-3 times daily while taking narcotic pain medicine to avoid constipation or until bowel movements are regular. Take over the counter iron of your choice twice a day for 6 weeks  You may drive after 2 weeks You may walk up steps  You may shower  You may resume a regular diet  Keep incisions clean and dry Do not lift over 15 pounds for 6 weeks Avoid anything in vagina for 6 weeks (or until after your post-operative visit)  Follow up with Dr. Leo Grosser for post operative visit on February 11, 2014 at 1:45 p.m.

## 2014-01-10 NOTE — Discharge Summary (Signed)
  Physician Discharge Summary  Patient ID: KATHARIN SCHNEIDER MRN: [id number] DOB/AGE: 44-Mar-1972 44 y.o.  Admit date: 01/08/2014 Discharge date: 01/10/2014   Discharge Diagnoses: Symptomatic Enlarging Uterine Fibroids Principal Problem:   Fibroids   Operation: Abdominal Myomectomy   Discharged Condition: Good  Hospital Course: On the date of admission the patient underwent the aforementioned procedure that yielded multiple fibroids from a 22 week size uterus, the largest measuring 12 c.m. but tolerated the procedure well.  Post operative course was unremarkable with the patient tolerating a post operative hemoglobin of 9.2 and by post operative day #2 has resume bowel and bladder function.  Having received the maximum benefit of her hospital stay,  the patient was discharged home on post operative day #2.   Disposition: Home to Self Care  Discharge Medications:    Medication List         Bee Pollen 1000 MG Tabs  Take 1 tablet by mouth daily.     benzonatate 200 MG capsule  Commonly known as:  TESSALON  Take 200 mg by mouth 3 (three) times daily as needed for cough.     Biotin 2.5 MG Tabs  Take 1 tablet by mouth daily.     calcium carbonate 600 MG Tabs tablet  Commonly known as:  OS-CAL  Take 600 mg by mouth 2 (two) times daily with a meal.     chlorpheniramine-HYDROcodone 10-8 MG/5ML Lqcr  Commonly known as:  TUSSIONEX  Take 5 mLs by mouth every 12 (twelve) hours as needed for cough.     fish oil-omega-3 fatty acids 1000 MG capsule  Take 1 g by mouth 2 (two) times daily.     ibuprofen 600 MG tablet  Commonly known as:  ADVIL,MOTRIN  1  po  pc every 6 hours for 5 days then prn-pain     loratadine 10 MG tablet  Commonly known as:  CLARITIN  Take 10 mg by mouth daily as needed for allergies.     Magnesium 250 MG Tabs  Take 1 tablet by mouth daily.     Norgestimate-Ethinyl Estradiol Triphasic 0.18/0.215/0.25 MG-35 MCG tablet  Commonly known as:  ORTHO TRI-CYCLEN  (28)  Take 1 tablet by mouth daily.     ondansetron 4 MG tablet  Commonly known as:  ZOFRAN  Take 1 tablet (4 mg total) by mouth every 8 (eight) hours as needed for nausea or vomiting.     OSTEO BI-FLEX ADV DOUBLE ST PO  Take 1 tablet by mouth 2 (two) times daily.     oxyCODONE-acetaminophen 5-325 MG per tablet  Commonly known as:  PERCOCET/ROXICET  Take 1-2 tablets by mouth every 4 (four) hours as needed for severe pain (moderate to severe pain (when tolerating fluids)).     Vitamin D3 2000 UNITS Tabs  Take 1 tablet by mouth daily.     VOLTAREN 1 % Gel  Generic drug:  diclofenac sodium  Apply 2 g topically 2 (two) times daily as needed (arthritis pain in knee).         Follow-up: Dr. Lorriane Shire P. Haygood on February 11, 2014 at 1:45 p.m.   SignedEarnstine Regal, PA-C 01/10/2014, 8:15 AM

## 2014-02-19 ENCOUNTER — Encounter: Payer: Self-pay | Admitting: Obstetrics and Gynecology

## 2014-03-08 ENCOUNTER — Other Ambulatory Visit: Payer: BC Managed Care – PPO

## 2014-03-12 ENCOUNTER — Encounter: Payer: BC Managed Care – PPO | Admitting: Family Medicine

## 2014-03-21 ENCOUNTER — Encounter: Payer: Self-pay | Admitting: Family Medicine

## 2014-03-26 ENCOUNTER — Ambulatory Visit: Payer: BC Managed Care – PPO | Admitting: Family Medicine

## 2014-04-02 ENCOUNTER — Encounter: Payer: BC Managed Care – PPO | Admitting: Family Medicine

## 2014-11-22 ENCOUNTER — Encounter: Payer: Self-pay | Admitting: Family Medicine

## 2014-11-22 ENCOUNTER — Ambulatory Visit (INDEPENDENT_AMBULATORY_CARE_PROVIDER_SITE_OTHER): Payer: BLUE CROSS/BLUE SHIELD | Admitting: Family Medicine

## 2014-11-22 VITALS — BP 110/82 | HR 90 | Temp 99.1°F | Resp 16 | Wt 151.1 lb

## 2014-11-22 DIAGNOSIS — K121 Other forms of stomatitis: Secondary | ICD-10-CM | POA: Diagnosis not present

## 2014-11-22 MED ORDER — LIDOCAINE VISCOUS 2 % MT SOLN: 20.0000 mL | OROMUCOSAL | Status: DC | PRN

## 2014-11-22 NOTE — Progress Notes (Signed)
Pre visit review using our clinic review tool, if applicable. No additional management support is needed unless otherwise documented below in the visit note. 

## 2014-11-22 NOTE — Patient Instructions (Signed)
Follow up as needed Use the Viscous lidocaine topically on the burn Drink plenty of fluids Ibuprofen as needed for discomfort REST! If you develop more mouth sores or areas on hands/feet- this would be consistent w/ Hand, Foot, Mouth virus Call with any questions or concerns Hang in there!!!

## 2014-11-22 NOTE — Progress Notes (Signed)
   Subjective:    Patient ID: Shelley Norman, female    DOB: 1970-05-08, 44 y.o.   MRN: [id number]  HPI Mouth burn- occurred on Monday after 'searing' mouth on piece of hot broccoli.  Has been rinsing mouth w/ salt water.  Continues to have pain.  Now concerned for infxn.  Denies fevers, chills, body aches, sore throat.   Review of Systems For ROS see HPI     Objective:   Physical Exam  Constitutional: She is oriented to person, place, and time. She appears well-developed and well-nourished. No distress.  HENT:  Head: Normocephalic and atraumatic.  Nose: Nose normal.  Mouth/Throat: Oropharynx is clear and moist. No oropharyngeal exudate.  TMs WNL bilaterally 3 small aphthous ulcers on roof of mouth- no surrounding erythema or evidence of infxn  Neck: Normal range of motion. Neck supple. No thyromegaly present.  Neurological: She is alert and oriented to person, place, and time.  Skin: Skin is warm and dry. No rash (no lesions on palms/soles) noted. No erythema.  Psychiatric: She has a normal mood and affect. Her behavior is normal.  Vitals reviewed.         Assessment & Plan:

## 2014-11-23 NOTE — Assessment & Plan Note (Signed)
New.  Pt has known burn but mouth ulcers appear to be consistent w/ HFM.  Reviewed this possibility w/ pt and discussed dx, viral illness course and supportive care if sxs were to arise.  Viscous lido to treat current ulcers.  Reviewed supportive care and red flags that should prompt return.  Pt expressed understanding and is in agreement w/ plan.

## 2015-04-08 LAB — HM MAMMOGRAPHY

## 2015-04-16 ENCOUNTER — Encounter: Payer: Self-pay | Admitting: Family Medicine

## 2016-01-28 ENCOUNTER — Telehealth: Payer: Self-pay | Admitting: Family Medicine

## 2016-01-28 DIAGNOSIS — Z1211 Encounter for screening for malignant neoplasm of colon: Secondary | ICD-10-CM

## 2016-01-28 NOTE — Telephone Encounter (Signed)
Caller name: Relationship to patient: Self Can be reached: [phone]  Pharmacy:  Reason for call: Request a referral to have a Colonoscopy done. States she wants to go to the same doctor that Dr. Etter Sjogren  Referred her mother Parke Simmers) to. States Dr. Etter Sjogren will know who it is.

## 2016-01-29 ENCOUNTER — Encounter: Payer: Self-pay | Admitting: Family Medicine

## 2016-01-29 NOTE — Telephone Encounter (Signed)
Called patient and left message to return call

## 2016-01-29 NOTE — Telephone Encounter (Signed)
Ok to refer--- if she is having a problem we need to see her first If she is requesting screening colon---In black population they start at 46 yo

## 2016-01-29 NOTE — Telephone Encounter (Signed)
Please advise given pt's age. Reviewed chart and I do not see personal or family f/o colon cancer, or any other GI history. Reviewed pt's mother's chart and she saw Dr. Carlean Purl in 2008.

## 2016-01-30 NOTE — Addendum Note (Signed)
Addended by: Naaman Plummer A on: 01/30/2016 02:14 PM   Modules accepted: Orders

## 2016-01-30 NOTE — Telephone Encounter (Signed)
Spoke with pt---is only wanting the referral for screening purposes. Referral placed. Pt aware.

## 2016-01-30 NOTE — Telephone Encounter (Signed)
Pt states Dr.Gessner's office said that all providers in the office are booked for a year out. Pt is requesting a referral to someone that will be able to see her much sooner. Please refer to another office and notify pt.

## 2016-01-30 NOTE — Telephone Encounter (Signed)
Referral faxed to [facility], awaiting appt

## 2016-03-23 ENCOUNTER — Encounter: Payer: Self-pay | Admitting: Family Medicine

## 2016-03-23 ENCOUNTER — Ambulatory Visit (INDEPENDENT_AMBULATORY_CARE_PROVIDER_SITE_OTHER): Payer: BLUE CROSS/BLUE SHIELD | Admitting: Family Medicine

## 2016-03-23 VITALS — BP 112/78 | HR 66 | Temp 98.3°F | Resp 16 | Ht 61.0 in | Wt 155.6 lb

## 2016-03-23 DIAGNOSIS — Z23 Encounter for immunization: Secondary | ICD-10-CM

## 2016-03-23 DIAGNOSIS — Z13220 Encounter for screening for lipoid disorders: Secondary | ICD-10-CM | POA: Diagnosis not present

## 2016-03-23 DIAGNOSIS — Z0000 Encounter for general adult medical examination without abnormal findings: Secondary | ICD-10-CM

## 2016-03-23 DIAGNOSIS — Z789 Other specified health status: Secondary | ICD-10-CM | POA: Insufficient documentation

## 2016-03-23 NOTE — Progress Notes (Signed)
Subjective:     Shelley Norman is a 45 y.o. female and is here for a comprehensive physical exam. The patient reports no problems.  Social History   Social History  . Marital status: Single    Spouse name: N/A  . Number of children: N/A  . Years of education: N/A   Occupational History  . Consultant    Social History Main Topics  . Smoking status: Never Smoker  . Smokeless tobacco: Never Used  . Alcohol use 0.6 oz/week    1 Glasses of wine per week     Comment: socially  . Drug use: No  . Sexual activity: Not Currently    Partners: Male    Birth control/ protection: Pill   Other Topics Concern  . Not on file   Social History Narrative   Exercise---  daily   Health Maintenance  Topic Date Due  . TETANUS/TDAP  03/11/2015  . INFLUENZA VACCINE  10/28/2015  . PAP SMEAR  03/09/2016  . MAMMOGRAM  04/07/2016  . HIV Screening  Completed    The following portions of the patient's history were reviewed and updated as appropriate: She  has a past medical history of Allergic rhinitis; Bacterial infection; Fibroids; Mastodynia; Osteoarthritis; PONV (postoperative nausea and vomiting); Vaginismus; Vitamin D deficiency; and Yeast infection. She  does not have any pertinent problems on file. She  has a past surgical history that includes Foot surgery; Wisdom tooth extraction; Hammer toe surgery (2007); and Myomectomy (N/A, 01/08/2014). Her family history includes Arthritis in her mother; Breast cancer in her sister; Diabetes in her father; Hyperlipidemia in her mother; Hypertension in her mother. She  reports that she has never smoked. She has never used smokeless tobacco. She reports that she drinks about 0.6 oz of alcohol per week . She reports that she does not use drugs. She has a current medication list which includes the following prescription(s): bee pollen, benzonatate, biotin, calcium carbonate, chlorpheniramine-hydrocodone, vitamin d3, fish oil-omega-3 fatty acids,  ibuprofen, lidocaine, loratadine, magnesium, misc natural products, norgestimate-ethinyl estradiol triphasic, ondansetron, oxycodone-acetaminophen, and voltaren. Current Outpatient Prescriptions on File Prior to Visit  Medication Sig Dispense Refill  . Bee Pollen 1000 MG TABS Take 1 tablet by mouth daily.      . benzonatate (TESSALON) 200 MG capsule Take 200 mg by mouth 3 (three) times daily as needed for cough.     . Biotin 2.5 MG TABS Take 1 tablet by mouth daily.      . calcium carbonate (OS-CAL) 600 MG TABS Take 600 mg by mouth 2 (two) times daily with a meal.      . chlorpheniramine-HYDROcodone (TUSSIONEX) 10-8 MG/5ML LQCR Take 5 mLs by mouth every 12 (twelve) hours as needed for cough.    . Cholecalciferol (VITAMIN D3) 2000 UNITS TABS Take 1 tablet by mouth daily.      . fish oil-omega-3 fatty acids 1000 MG capsule Take 1 g by mouth 2 (two) times daily.      Marland Kitchen ibuprofen (ADVIL,MOTRIN) 600 MG tablet 1  po  pc every 6 hours for 5 days then prn-pain 30 tablet 1  . lidocaine (XYLOCAINE) 2 % solution Use as directed 20 mLs in the mouth or throat as needed for mouth pain. 100 mL 0  . loratadine (CLARITIN) 10 MG tablet Take 10 mg by mouth daily as needed for allergies.     . Magnesium 250 MG TABS Take 1 tablet by mouth daily.      . Misc Natural Products (  OSTEO BI-FLEX ADV DOUBLE ST PO) Take 1 tablet by mouth 2 (two) times daily.      . Norgestimate-Ethinyl Estradiol Triphasic (ORTHO TRI-CYCLEN, 28,) 0.18/0.215/0.25 MG-35 MCG tablet Take 1 tablet by mouth daily. 3 Package 3  . ondansetron (ZOFRAN) 4 MG tablet Take 1 tablet (4 mg total) by mouth every 8 (eight) hours as needed for nausea or vomiting. 20 tablet 0  . oxyCODONE-acetaminophen (PERCOCET/ROXICET) 5-325 MG per tablet Take 1-2 tablets by mouth every 4 (four) hours as needed for severe pain (moderate to severe pain (when tolerating fluids)). 30 tablet 0  . VOLTAREN 1 % GEL Apply 2 g topically 2 (two) times daily as needed (arthritis pain in  knee).      No current facility-administered medications on file prior to visit.    She is allergic to tetracycline..  Review of Systems Review of Systems  Constitutional: Negative for activity change, appetite change and fatigue.  HENT: Negative for hearing loss, congestion, tinnitus and ear discharge.  dentist q74m Eyes: Negative for visual disturbance (see optho q1y -- vision corrected to 20/20 with glasses).  Respiratory: Negative for cough, chest tightness and shortness of breath.   Cardiovascular: Negative for chest pain, palpitations and leg swelling.  Gastrointestinal: Negative for abdominal pain, diarrhea, constipation and abdominal distention.  Genitourinary: Negative for urgency, frequency, decreased urine volume and difficulty urinating.  Musculoskeletal: Negative for back pain, arthralgias and gait problem.  Skin: Negative for color change, pallor and rash.  Neurological: Negative for dizziness, light-headedness, numbness and headaches.  Hematological: Negative for adenopathy. Does not bruise/bleed easily.  Psychiatric/Behavioral: Negative for suicidal ideas, confusion, sleep disturbance, self-injury, dysphoric mood, decreased concentration and agitation.       Objective:    BP 112/78 (BP Location: Left Arm, Patient Position: Sitting, Cuff Size: Normal)   Pulse 66   Temp 98.3 F (36.8 C) (Oral)   Resp 16   Ht 5\' 1"  (1.549 m)   Wt 155 lb 9.6 oz (70.6 kg)   LMP 03/15/2016 (Exact Date)   SpO2 98%   BMI 29.40 kg/m  General appearance: alert, cooperative, appears stated age and no distress Head: Normocephalic, without obvious abnormality, atraumatic Eyes: conjunctivae/corneas clear. PERRL, EOM's intact. Fundi benign. Ears: normal TM's and external ear canals both ears Nose: Nares normal. Septum midline. Mucosa normal. No drainage or sinus tenderness. Throat: lips, mucosa, and tongue normal; teeth and gums normal Neck: no adenopathy, no carotid bruit, no JVD, supple,  symmetrical, trachea midline and thyroid not enlarged, symmetric, no tenderness/mass/nodules Back: symmetric, no curvature. ROM normal. No CVA tenderness. Lungs: clear to auscultation bilaterally Breasts: normal appearance, no masses or tenderness Heart: regular rate and rhythm, S1, S2 normal, no murmur, click, rub or gallop Abdomen: soft, non-tender; bowel sounds normal; no masses,  no organomegaly Pelvic: deferred Extremities: extremities normal, atraumatic, no cyanosis or edema Pulses: 2+ and symmetric Skin: Skin color, texture, turgor normal. No rashes or lesions Lymph nodes: Cervical, supraclavicular, and axillary nodes normal. Neurologic: Alert and oriented X 3, normal strength and tone. Normal symmetric reflexes. Normal coordination and gait    Assessment:    Healthy female exam.    Plan:    ghm utd Check labs See After Visit Summary for Counseling Recommendations

## 2016-03-23 NOTE — Patient Instructions (Signed)

## 2016-03-24 LAB — LIPID PANEL
CHOLESTEROL: 227 mg/dL — AB (ref 0–200)
HDL: 79.4 mg/dL (ref >39.00)
LDL CALC: 131 mg/dL — AB (ref 0–99)
NonHDL: 148.01
TRIGLYCERIDES: 86 mg/dL (ref 0.0–149.0)
Total CHOL/HDL Ratio: 3
VLDL: 17.2 mg/dL (ref 0.0–40.0)

## 2016-03-24 LAB — COMPREHENSIVE METABOLIC PANEL
ALBUMIN: 4.4 g/dL (ref 3.5–5.2)
ALK PHOS: 70 U/L (ref 39–117)
ALT: 23 U/L (ref 0–35)
AST: 24 U/L (ref 0–37)
BUN: 12 mg/dL (ref 6–23)
CALCIUM: 9.3 mg/dL (ref 8.4–10.5)
CHLORIDE: 103 meq/L (ref 96–112)
CO2: 29 mEq/L (ref 19–32)
Creatinine, Ser: 1.05 mg/dL (ref 0.40–1.20)
GFR: 72.35 mL/min (ref >60.00)
Glucose, Bld: 91 mg/dL (ref 70–99)
Potassium: 4.2 mEq/L (ref 3.5–5.1)
Sodium: 139 mEq/L (ref 135–145)
TOTAL PROTEIN: 7.6 g/dL (ref 6.0–8.3)
Total Bilirubin: 0.3 mg/dL (ref 0.2–1.2)

## 2016-03-24 LAB — CBC
HEMATOCRIT: 36.5 % (ref 36.0–46.0)
HEMOGLOBIN: 12.5 g/dL (ref 12.0–15.0)
MCHC: 34.3 g/dL (ref 30.0–36.0)
MCV: 91.6 fl (ref 78.0–100.0)
PLATELETS: 266 10*3/uL (ref 150.0–400.0)
RBC: 3.99 Mil/uL (ref 3.87–5.11)
RDW: 12.9 % (ref 11.5–15.5)
WBC: 4.5 10*3/uL (ref 4.0–10.5)

## 2016-03-29 ENCOUNTER — Encounter: Payer: Self-pay | Admitting: Family Medicine

## 2016-03-30 NOTE — Telephone Encounter (Signed)
Sugar will be checked with cholesterol---- should be cmp and lipid

## 2016-04-19 ENCOUNTER — Telehealth: Payer: Self-pay

## 2016-04-19 DIAGNOSIS — E0801 Diabetes mellitus due to underlying condition with hyperosmolarity with coma: Secondary | ICD-10-CM

## 2016-04-19 NOTE — Telephone Encounter (Signed)
Entered future labs for pt. LB

## 2016-05-17 ENCOUNTER — Telehealth: Payer: Self-pay | Admitting: Family Medicine

## 2016-05-17 NOTE — Telephone Encounter (Signed)
Butler        The only dx I can change would be the lipid. I will CC you on the email. There is no other reasons documented for the other labs and Z00.00 is what was on lab order. I do not even see a A1C being billed for this date.    Thanks,  Dawn    Patient informed

## 2016-05-17 NOTE — Telephone Encounter (Signed)
Patient called to dispute her bill for her labs 03/23/16. I have submitted to coding to review and will call patient with an update at [phone]

## 2016-06-02 ENCOUNTER — Ambulatory Visit: Payer: BLUE CROSS/BLUE SHIELD | Admitting: Medical

## 2016-06-03 ENCOUNTER — Encounter: Payer: Self-pay | Admitting: Family Medicine

## 2016-06-03 ENCOUNTER — Ambulatory Visit (INDEPENDENT_AMBULATORY_CARE_PROVIDER_SITE_OTHER): Payer: BLUE CROSS/BLUE SHIELD | Admitting: Family Medicine

## 2016-06-03 VITALS — BP 108/78 | HR 83 | Temp 98.3°F | Resp 16 | Ht 61.0 in | Wt 153.4 lb

## 2016-06-03 DIAGNOSIS — E785 Hyperlipidemia, unspecified: Secondary | ICD-10-CM

## 2016-06-03 DIAGNOSIS — R739 Hyperglycemia, unspecified: Secondary | ICD-10-CM

## 2016-06-03 DIAGNOSIS — J329 Chronic sinusitis, unspecified: Secondary | ICD-10-CM | POA: Diagnosis not present

## 2016-06-03 LAB — COMPREHENSIVE METABOLIC PANEL
ALBUMIN: 4.4 g/dL (ref 3.5–5.2)
ALT: 16 U/L (ref 0–35)
AST: 20 U/L (ref 0–37)
Alkaline Phosphatase: 65 U/L (ref 39–117)
BUN: 11 mg/dL (ref 6–23)
CHLORIDE: 100 meq/L (ref 96–112)
CO2: 30 mEq/L (ref 19–32)
Calcium: 10 mg/dL (ref 8.4–10.5)
Creatinine, Ser: 0.99 mg/dL (ref 0.40–1.20)
GFR: 77.37 mL/min (ref >60.00)
GLUCOSE: 88 mg/dL (ref 70–99)
POTASSIUM: 3.8 meq/L (ref 3.5–5.1)
Sodium: 136 mEq/L (ref 135–145)
Total Bilirubin: 0.4 mg/dL (ref 0.2–1.2)
Total Protein: 8.3 g/dL (ref 6.0–8.3)

## 2016-06-03 LAB — LIPID PANEL
Cholesterol: 205 mg/dL — ABNORMAL HIGH (ref 0–200)
HDL: 56.8 mg/dL (ref >39.00)
LDL Cholesterol: 128 mg/dL — ABNORMAL HIGH (ref 0–99)
NONHDL: 148.11
Total CHOL/HDL Ratio: 4
Triglycerides: 103 mg/dL (ref 0.0–149.0)
VLDL: 20.6 mg/dL (ref 0.0–40.0)

## 2016-06-03 LAB — HEMOGLOBIN A1C: Hgb A1c MFr Bld: 5.5 % (ref 4.6–6.5)

## 2016-06-03 MED ORDER — HYDROCOD POLST-CPM POLST ER 10-8 MG/5ML PO SUER: 5.0000 mL | Freq: Two times a day (BID) | ORAL | 0 refills | Status: DC | PRN

## 2016-06-03 MED ORDER — CEFUROXIME AXETIL 500 MG PO TABS: 500.0000 mg | ORAL_TABLET | Freq: Two times a day (BID) | ORAL | 0 refills | Status: DC

## 2016-06-03 MED ORDER — FLUTICASONE PROPIONATE 50 MCG/ACT NA SUSP: 2.0000 | Freq: Every day | NASAL | 1 refills | Status: DC

## 2016-06-03 MED ORDER — LEVOCETIRIZINE DIHYDROCHLORIDE 5 MG PO TABS: 5.0000 mg | ORAL_TABLET | Freq: Every evening | ORAL | 1 refills | Status: DC

## 2016-06-03 MED FILL — CEFUROXIME AXETIL 500 MG TA: 500 | 10 days supply | Qty: 20 | Fill #0

## 2016-06-03 MED FILL — HYDROCODONE-CHLORPHENIRAM S: 10-8 | 20 days supply | Qty: 200 | Fill #0

## 2016-06-03 MED FILL — FLUTICASONE PROP 50 MCG SPR: 50 | 30 days supply | Qty: 16 | Fill #0

## 2016-06-03 NOTE — Progress Notes (Signed)
Pre visit review using our clinic review tool, if applicable. No additional management support is needed unless otherwise documented below in the visit note. 

## 2016-06-03 NOTE — Progress Notes (Signed)
Patient ID: Shelley Norman, female   DOB: [birth year], 46 y.o.   MRN: [id number]    Subjective:  I acted as a Education administrator for Dr. Carollee Herter.  Guerry Bruin, [place]   Patient ID: Shelley Norman, female    DOB: February 15, 1971, 46 y.o.   MRN: [id number]  Chief Complaint  Patient presents with  . Cough    started Tuesday  . Nasal Congestion    Cough  This is a new problem. Episode onset: Tuesday. The problem has been gradually worsening. The problem occurs constantly. The cough is productive of purulent sputum. Associated symptoms include a fever, headaches, nasal congestion, postnasal drip, a sore throat and sweats. Pertinent negatives include no chest pain, chills, ear congestion, ear pain or hemoptysis. Associated symptoms comments: bodyaches . Treatments tried: vicks vapor. The treatment provided mild relief.    Patient is in today for cough anda nasal congestion.  Patient Care Team: Ann Held, DO as PCP - General   Past Medical History:  Diagnosis Date  . Allergic rhinitis   . Bacterial infection   . Fibroids   . Mastodynia   . Osteoarthritis    feet, knees, lower back - no meds  . PONV (postoperative nausea and vomiting)   . Vaginismus   . Vitamin D deficiency   . Yeast infection     Past Surgical History:  Procedure Laterality Date  . FOOT SURGERY     bilateral 2 right and 2 left foot surgeries - totatl 4  . HAMMER TOE SURGERY  2007   left foot - toe next to pinky  . MYOMECTOMY N/A 01/08/2014   Procedure: Abdominal MYOMECTOMY;  Surgeon: Eldred Manges, MD;  Location: [facility];  Service: Gynecology;  Laterality: N/A;  . WISDOM TOOTH EXTRACTION      Family History  Problem Relation Age of Onset  . Hypertension Mother   . Hyperlipidemia Mother   . Arthritis Mother   . Diabetes Father   . Osteoporosis    . Breast cancer Sister     Social History   Social History  . Marital status: Single    Spouse name: N/A  . Number of children: N/A  . Years of education:  N/A   Occupational History  . Consultant    Social History Main Topics  . Smoking status: Never Smoker  . Smokeless tobacco: Never Used  . Alcohol use 0.6 oz/week    1 Glasses of wine per week     Comment: socially  . Drug use: No  . Sexual activity: Not Currently    Partners: Male    Birth control/ protection: Pill   Other Topics Concern  . Not on file   Social History Narrative   Exercise---  daily    Outpatient Medications Prior to Visit  Medication Sig Dispense Refill  . Bee Pollen 1000 MG TABS Take 1 tablet by mouth daily.      . Biotin 2.5 MG TABS Take 1 tablet by mouth daily.      . Cholecalciferol (VITAMIN D3) 2000 UNITS TABS Take 1 tablet by mouth daily.      . fish oil-omega-3 fatty acids 1000 MG capsule Take 1 g by mouth 2 (two) times daily.      Marland Kitchen ibuprofen (ADVIL,MOTRIN) 600 MG tablet 1  po  pc every 6 hours for 5 days then prn-pain 30 tablet 1  . loratadine (CLARITIN) 10 MG tablet Take 10 mg by mouth daily as needed for allergies.     Marland Kitchen  Magnesium 250 MG TABS Take 1 tablet by mouth daily.      . Misc Natural Products (OSTEO BI-FLEX ADV DOUBLE ST PO) Take 1 tablet by mouth 2 (two) times daily.      . Norgestimate-Ethinyl Estradiol Triphasic (ORTHO TRI-CYCLEN, 28,) 0.18/0.215/0.25 MG-35 MCG tablet Take 1 tablet by mouth daily. 3 Package 3  . VOLTAREN 1 % GEL Apply 2 g topically 2 (two) times daily as needed (arthritis pain in knee).     . chlorpheniramine-HYDROcodone (TUSSIONEX) 10-8 MG/5ML LQCR Take 5 mLs by mouth every 12 (twelve) hours as needed for cough.    . benzonatate (TESSALON) 200 MG capsule Take 200 mg by mouth 3 (three) times daily as needed for cough.     . calcium carbonate (OS-CAL) 600 MG TABS Take 600 mg by mouth 2 (two) times daily with a meal.      . lidocaine (XYLOCAINE) 2 % solution Use as directed 20 mLs in the mouth or throat as needed for mouth pain. 100 mL 0  . ondansetron (ZOFRAN) 4 MG tablet Take 1 tablet (4 mg total) by mouth every 8  (eight) hours as needed for nausea or vomiting. 20 tablet 0  . oxyCODONE-acetaminophen (PERCOCET/ROXICET) 5-325 MG per tablet Take 1-2 tablets by mouth every 4 (four) hours as needed for severe pain (moderate to severe pain (when tolerating fluids)). 30 tablet 0   No facility-administered medications prior to visit.     Allergies  Allergen Reactions  . Tetracycline Other (See Comments)    Yeast infection    Review of Systems  Constitutional: Positive for fever. Negative for chills.  HENT: Positive for postnasal drip and sore throat. Negative for ear pain.   Respiratory: Positive for cough. Negative for hemoptysis.   Cardiovascular: Negative for chest pain.  Neurological: Positive for headaches.       Objective:    Physical Exam  Constitutional: She is oriented to person, place, and time. She appears well-developed and well-nourished. No distress.  HENT:  Head: Normocephalic and atraumatic.  Eyes: Conjunctivae are normal.  Neck: Normal range of motion. No thyromegaly present.  Cardiovascular: Normal rate and regular rhythm.   Pulmonary/Chest: Effort normal and breath sounds normal. She has no wheezes.  Abdominal: Soft. Bowel sounds are normal. There is no tenderness.  Musculoskeletal: Normal range of motion. She exhibits no edema or deformity.  Neurological: She is alert and oriented to person, place, and time.  Skin: Skin is warm and dry. She is not diaphoretic.  Psychiatric: She has a normal mood and affect.    BP 108/78 (BP Location: Left Arm, Cuff Size: Normal)   Pulse 83   Temp 98.3 F (36.8 C) (Oral)   Resp 16   Ht 5\' 1"  (1.549 m)   Wt 153 lb 6.4 oz (69.6 kg)   LMP 05/26/2016   SpO2 98%   BMI 28.98 kg/m  Wt Readings from Last 3 Encounters:  06/03/16 153 lb 6.4 oz (69.6 kg)  03/23/16 155 lb 9.6 oz (70.6 kg)  11/22/14 151 lb 2 oz (68.5 kg)     Lab Results  Component Value Date   WBC 4.5 03/23/2016   HGB 12.5 03/23/2016   HCT 36.5 03/23/2016   PLT 266.0  03/23/2016   GLUCOSE 88 06/03/2016   CHOL 205 (H) 06/03/2016   TRIG 103.0 06/03/2016   HDL 56.80 06/03/2016   LDLDIRECT 113.3 01/10/2013   LDLCALC 128 (H) 06/03/2016   ALT 16 06/03/2016   AST 20 06/03/2016  NA 136 06/03/2016   K 3.8 06/03/2016   CL 100 06/03/2016   CREATININE 0.99 06/03/2016   BUN 11 06/03/2016   CO2 30 06/03/2016   TSH 1.50 01/10/2013   HGBA1C 5.5 06/03/2016    Lab Results  Component Value Date   TSH 1.50 01/10/2013   Lab Results  Component Value Date   WBC 4.5 03/23/2016   HGB 12.5 03/23/2016   HCT 36.5 03/23/2016   MCV 91.6 03/23/2016   PLT 266.0 03/23/2016   Lab Results  Component Value Date   NA 136 06/03/2016   K 3.8 06/03/2016   CO2 30 06/03/2016   GLUCOSE 88 06/03/2016   BUN 11 06/03/2016   CREATININE 0.99 06/03/2016   BILITOT 0.4 06/03/2016   ALKPHOS 65 06/03/2016   AST 20 06/03/2016   ALT 16 06/03/2016   PROT 8.3 06/03/2016   ALBUMIN 4.4 06/03/2016   CALCIUM 10.0 06/03/2016   ANIONGAP 13 01/08/2014   GFR 77.37 06/03/2016   Lab Results  Component Value Date   CHOL 205 (H) 06/03/2016   Lab Results  Component Value Date   HDL 56.80 06/03/2016   Lab Results  Component Value Date   LDLCALC 128 (H) 06/03/2016   Lab Results  Component Value Date   TRIG 103.0 06/03/2016   Lab Results  Component Value Date   CHOLHDL 4 06/03/2016   Lab Results  Component Value Date   HGBA1C 5.5 06/03/2016       Assessment & Plan:   Problem List Items Addressed This Visit    None    Visit Diagnoses    Sinusitis, unspecified chronicity, unspecified location    -  Primary   Relevant Medications   fluticasone (FLONASE) 50 MCG/ACT nasal spray   levocetirizine (XYZAL) 5 MG tablet   chlorpheniramine-HYDROcodone (TUSSIONEX PENNKINETIC ER) 10-8 MG/5ML SUER   cefUROXime (CEFTIN) 500 MG tablet   Hyperglycemia       Relevant Orders   Hemoglobin A1c (Completed)   Comprehensive metabolic panel (Completed)   Hyperlipidemia, unspecified  hyperlipidemia type       Relevant Orders   Lipid panel (Completed)   Comprehensive metabolic panel (Completed)      I have discontinued Ms. Shankman's calcium carbonate, benzonatate, chlorpheniramine-HYDROcodone, ondansetron, oxyCODONE-acetaminophen, and lidocaine. I am also having her start on fluticasone, levocetirizine, chlorpheniramine-HYDROcodone, and cefUROXime. Additionally, I am having her maintain her Bee Pollen, fish oil-omega-3 fatty acids, Biotin, Magnesium, Misc Natural Products (OSTEO BI-FLEX ADV DOUBLE ST PO), Vitamin D3, loratadine, VOLTAREN, Norgestimate-Ethinyl Estradiol Triphasic, and ibuprofen.  Meds ordered this encounter  Medications  . fluticasone (FLONASE) 50 MCG/ACT nasal spray    Sig: Place 2 sprays into both nostrils daily.    Dispense:  16 g    Refill:  1  . levocetirizine (XYZAL) 5 MG tablet    Sig: Take 1 tablet (5 mg total) by mouth every evening.    Dispense:  30 tablet    Refill:  1  . chlorpheniramine-HYDROcodone (TUSSIONEX PENNKINETIC ER) 10-8 MG/5ML SUER    Sig: Take 5 mLs by mouth every 12 (twelve) hours as needed for cough.    Dispense:  200 mL    Refill:  0  . cefUROXime (CEFTIN) 500 MG tablet    Sig: Take 1 tablet (500 mg total) by mouth 2 (two) times daily with a meal.    Dispense:  20 tablet    Refill:  0    CMA served as scribe during this visit. History, Physical and Plan performed  by medical provider. Documentation and orders reviewed and attested to.  Ann Held, DO

## 2016-06-03 NOTE — Patient Instructions (Signed)

## 2016-06-28 NOTE — Telephone Encounter (Signed)
Patient called back in insurance still not covering Lipid panel after z13.220  For DOSwas submitted for screening. Patient will wait and see how [employer] pays hers march visit will call back after that

## 2016-08-06 ENCOUNTER — Telehealth: Payer: Self-pay | Admitting: Family Medicine

## 2016-08-06 NOTE — Telephone Encounter (Signed)
Caller name: Relationship to patient: Can be reached: [phone] Pharmacy:  Reason for call: Patient request a call back to discuss a Dx she saw in her chart via My Chart. Patient states she does not understand why the Dx is there.

## 2016-08-09 NOTE — Telephone Encounter (Signed)
Not sure how to handle -- her cholesterol was slightly high in dec which is why we asked to repeat and she was in for sinus infection in March not preventative--  And if suger is high and I want to recheck we dx hyperglycemia-- its not diabetes -----but I can't not repeat with prevent code

## 2016-08-09 NOTE — Telephone Encounter (Signed)
Patient informed of PCP instructions. She completely understood and agreed.

## 2016-08-09 NOTE — Telephone Encounter (Signed)
Patient states that her last labs done in March dx code used were hyperlipidemia and hyperglycemia--which she states neither has she ever been -- also she states should have been coded as preventative and insurance is now questioning. Advise please

## 2016-09-09 ENCOUNTER — Telehealth: Payer: Self-pay | Admitting: Family Medicine

## 2016-09-09 NOTE — Telephone Encounter (Signed)
Signa Kell a rep from [place] called about claim for [facility] 03/23/16: She has information on how to file the claim for visit for [employer] to cover. Call back number is [phone] called and left her a message

## 2017-03-25 ENCOUNTER — Encounter: Payer: BLUE CROSS/BLUE SHIELD | Admitting: Family Medicine

## 2017-09-08 DIAGNOSIS — M1712 Unilateral primary osteoarthritis, left knee: Secondary | ICD-10-CM | POA: Insufficient documentation

## 2018-01-05 DIAGNOSIS — N942 Vaginismus: Secondary | ICD-10-CM | POA: Insufficient documentation

## 2018-01-05 DIAGNOSIS — N644 Mastodynia: Secondary | ICD-10-CM | POA: Insufficient documentation

## 2018-07-24 ENCOUNTER — Encounter: Payer: Self-pay | Admitting: *Deleted

## 2019-12-21 ENCOUNTER — Telehealth: Payer: Self-pay | Admitting: Family Medicine

## 2019-12-21 NOTE — Telephone Encounter (Signed)
Patient would like to know if you had fax FMLA paperwork?

## 2019-12-21 NOTE — Telephone Encounter (Signed)
Pt called. LVM. FMLA faxed and emailed.

## 2020-01-25 ENCOUNTER — Other Ambulatory Visit: Payer: Self-pay | Admitting: Obstetrics and Gynecology

## 2020-01-25 DIAGNOSIS — R928 Other abnormal and inconclusive findings on diagnostic imaging of breast: Secondary | ICD-10-CM

## 2020-01-31 ENCOUNTER — Ambulatory Visit
Admission: RE | Admit: 2020-01-31 | Discharge: 2020-01-31 | Disposition: A | Discharge status: Home / Self Care | Payer: BLUE CROSS/BLUE SHIELD | Source: Ambulatory Visit | Attending: Obstetrics and Gynecology | Admitting: Obstetrics and Gynecology

## 2020-01-31 ENCOUNTER — Ambulatory Visit
Admission: RE | Admit: 2020-01-31 | Discharge: 2020-01-31 | Disposition: A | Discharge status: Home / Self Care | Payer: Managed Care, Other (non HMO) | Source: Ambulatory Visit | Attending: Obstetrics and Gynecology | Admitting: Obstetrics and Gynecology

## 2020-01-31 ENCOUNTER — Other Ambulatory Visit: Payer: Self-pay | Admitting: Obstetrics and Gynecology

## 2020-01-31 ENCOUNTER — Other Ambulatory Visit: Payer: Self-pay

## 2020-01-31 DIAGNOSIS — R928 Other abnormal and inconclusive findings on diagnostic imaging of breast: Secondary | ICD-10-CM

## 2020-01-31 DIAGNOSIS — R599 Enlarged lymph nodes, unspecified: Secondary | ICD-10-CM

## 2020-02-11 ENCOUNTER — Other Ambulatory Visit: Payer: Self-pay | Admitting: Obstetrics and Gynecology

## 2020-02-11 DIAGNOSIS — R222 Localized swelling, mass and lump, trunk: Secondary | ICD-10-CM

## 2020-02-14 ENCOUNTER — Ambulatory Visit
Admission: RE | Admit: 2020-02-14 | Discharge: 2020-02-14 | Disposition: A | Discharge status: Home / Self Care | Payer: Managed Care, Other (non HMO) | Source: Ambulatory Visit | Attending: Obstetrics and Gynecology | Admitting: Obstetrics and Gynecology

## 2020-02-14 ENCOUNTER — Other Ambulatory Visit: Payer: Self-pay | Admitting: Obstetrics and Gynecology

## 2020-02-14 ENCOUNTER — Other Ambulatory Visit (HOSPITAL_COMMUNITY)
Admission: RE | Admit: 2020-02-14 | Discharge: 2020-02-14 | Disposition: A | Discharge status: Home / Self Care | Payer: Managed Care, Other (non HMO) | Source: Ambulatory Visit | Attending: Obstetrics and Gynecology | Admitting: Obstetrics and Gynecology

## 2020-02-14 ENCOUNTER — Other Ambulatory Visit: Payer: Self-pay

## 2020-02-14 DIAGNOSIS — R599 Enlarged lymph nodes, unspecified: Secondary | ICD-10-CM

## 2020-02-15 LAB — SURGICAL PATHOLOGY

## 2020-02-25 ENCOUNTER — Other Ambulatory Visit: Payer: Self-pay | Admitting: Obstetrics and Gynecology

## 2020-02-25 DIAGNOSIS — R2231 Localized swelling, mass and lump, right upper limb: Secondary | ICD-10-CM

## 2020-02-26 ENCOUNTER — Other Ambulatory Visit: Payer: Self-pay

## 2020-02-26 ENCOUNTER — Ambulatory Visit
Admission: RE | Admit: 2020-02-26 | Discharge: 2020-02-26 | Disposition: A | Discharge status: Home / Self Care | Payer: Managed Care, Other (non HMO) | Source: Ambulatory Visit | Attending: Obstetrics and Gynecology | Admitting: Obstetrics and Gynecology

## 2020-02-26 ENCOUNTER — Other Ambulatory Visit: Payer: Self-pay | Admitting: Obstetrics and Gynecology

## 2020-02-26 DIAGNOSIS — R2231 Localized swelling, mass and lump, right upper limb: Secondary | ICD-10-CM

## 2020-08-07 DIAGNOSIS — M7711 Lateral epicondylitis, right elbow: Secondary | ICD-10-CM | POA: Insufficient documentation

## 2020-08-21 ENCOUNTER — Telehealth: Payer: Self-pay

## 2020-08-21 NOTE — Telephone Encounter (Signed)
Error

## 2020-10-30 ENCOUNTER — Encounter: Payer: Self-pay | Admitting: Internal Medicine

## 2020-10-30 ENCOUNTER — Other Ambulatory Visit: Payer: Self-pay

## 2020-10-30 ENCOUNTER — Ambulatory Visit: Payer: Managed Care, Other (non HMO) | Admitting: Internal Medicine

## 2020-10-30 DIAGNOSIS — R058 Other specified cough: Secondary | ICD-10-CM | POA: Diagnosis not present

## 2020-10-30 LAB — CBC WITH DIFFERENTIAL/PLATELET
Basophils Absolute: 0 10*3/uL (ref 0.0–0.1)
Basophils Relative: 0.5 % (ref 0.0–3.0)
Eosinophils Absolute: 0.1 10*3/uL (ref 0.0–0.7)
Eosinophils Relative: 1.8 % (ref 0.0–5.0)
HCT: 36.2 % (ref 36.0–46.0)
Hemoglobin: 12 g/dL (ref 12.0–15.0)
Lymphocytes Relative: 25 % (ref 12.0–46.0)
Lymphs Abs: 1.5 10*3/uL (ref 0.7–4.0)
MCHC: 33.3 g/dL (ref 30.0–36.0)
MCV: 90.2 fl (ref 78.0–100.0)
Monocytes Absolute: 0.3 10*3/uL (ref 0.1–1.0)
Monocytes Relative: 5 % (ref 3.0–12.0)
Neutro Abs: 4.1 10*3/uL (ref 1.4–7.7)
Neutrophils Relative %: 67.7 % (ref 43.0–77.0)
Platelets: 332 10*3/uL (ref 150.0–400.0)
RBC: 4.01 Mil/uL (ref 3.87–5.11)
RDW: 13.6 % (ref 11.5–15.5)
WBC: 6.1 10*3/uL (ref 4.0–10.5)

## 2020-10-30 MED ORDER — MONTELUKAST SODIUM 10 MG PO TABS: 10.0000 mg | ORAL_TABLET | Freq: Every day | ORAL | 11 refills | Status: DC

## 2020-10-30 MED ORDER — PREDNISONE 10 MG PO TABS: ORAL_TABLET | ORAL | 0 refills | Status: DC

## 2020-10-30 MED ORDER — PROMETHAZINE-DM 6.25-15 MG/5ML PO SYRP: 5.0000 mL | ORAL_SOLUTION | Freq: Four times a day (QID) | ORAL | 1 refills | Status: DC | PRN

## 2020-10-30 NOTE — Progress Notes (Signed)
Shelley Norman, female    DOB: July 06, 1970   MRN: [id number]   Brief patient profile:  6 yobf never smoker  self referred to pulmonary clinic 10/30/2020  for cough.   Prev eval 02/2012 by Dr Shelley Norman for allergic rhinitis dated back to 2006 and cough onset around 2011  rx singulair albuterol. > resolved and all she needed prn clariton D then relapsed July 11th 2022 p exp to moldy conditions from a clogged up A/C line with water in hallways.  July 16th 2022  rx at [facility]  promethazine and augmentin > helped some     History of Present Illness  10/30/2020  Pulmonary/ 1st office eval/Shelley Norman  Chief Complaint  Patient presents with   Consult    Coughing, 10/07/2020. Started with sinus infection.some sob when coughing and talking long period of time.  Dyspnea:  fine unless  Cough: daytime > night more often,  dry / assoc with pnds despite clariton d Sleep: bed is flat, lots of pillows needed since the cough onset SABA use:  Covid status   vax x 3   No obvious day to day or daytime variability or assoc excess/ purulent sputum or mucus plugs or hemoptysis or cp or chest tightness, subjective wheeze or overt sinus or hb symptoms.    Also denies any obvious fluctuation of symptoms with weather or environmental changes or other aggravating or alleviating factors except as outlined above   No unusual exposure hx or h/o childhood pna/ asthma or knowledge of premature birth.  Current Allergies, Complete Past Medical History, Past Surgical History, Family History, and Social History were reviewed in [employer] record.  ROS  The following are not active complaints unless bolded Hoarseness, sore throat, dysphagia, dental problems, itching, sneezing,  nasal congestion or discharge of excess mucus or purulent secretions, ear ache,   fever, chills, sweats, unintended wt loss or wt gain, classically pleuritic or exertional cp,  orthopnea pnd or arm/hand swelling  or leg swelling,  presyncope, palpitations, abdominal pain, anorexia, nausea, vomiting, diarrhea  or change in bowel habits or change in bladder habits, change in stools or change in urine, dysuria, hematuria,  rash, arthralgias, visual complaints, headache, numbness, weakness or ataxia or problems with walking or coordination,  change in mood or  memory.            Past Medical History:  Diagnosis Date   Allergic rhinitis    Bacterial infection    Fibroids    Mastodynia    Osteoarthritis    feet, knees, lower back - no meds   PONV (postoperative nausea and vomiting)    Vaginismus    Vitamin D deficiency    Yeast infection     Outpatient Medications Prior to Visit  Medication Sig Dispense Refill   Bee Pollen 1000 MG TABS Take 1 tablet by mouth daily.       Biotin 2.5 MG TABS Take 1 tablet by mouth daily.       Cholecalciferol (VITAMIN D3) 2000 UNITS TABS Take 1 tablet by mouth daily.       fish oil-omega-3 fatty acids 1000 MG capsule Take 1 g by mouth 2 (two) times daily.       ibuprofen (ADVIL,MOTRIN) 600 MG tablet 1  po  pc every 6 hours for 5 days then prn-pain 30 tablet 1       1   loratadine (CLARITIN) 10 MG tablet Take 10 mg by mouth daily as needed for allergies.  Magnesium 250 MG TABS Take 1 tablet by mouth daily.       Misc Natural Products (OSTEO BI-FLEX ADV DOUBLE ST PO) Take 1 tablet by mouth 2 (two) times daily.       Norgestimate-Ethinyl Estradiol Triphasic (ORTHO TRI-CYCLEN, 28,) 0.18/0.215/0.25 MG-35 MCG tablet Take 1 tablet by mouth daily. 3 Package 3   PROMETHAZINE-DM PO Take by mouth.     VOLTAREN 1 % GEL Apply 2 g topically 2 (two) times daily as needed (arthritis pain in knee).                 0      Objective:     BP 122/80 (BP Location: Left Arm, Cuff Size: Normal)   Pulse 98   Ht '5\' 1"'$  (1.549 m)   Wt 149 lb 12.8 oz (67.9 kg)   SpO2 99% Comment: ra  BMI 28.30 kg/m   SpO2: 99 % (ra)  Pleasantly verbose amb bf nad/ the more she talked the more she coughed    HEENT : pt wearing mask not removed for exam due to covid -19 concerns.    NECK :  without JVD/Nodes/TM/ nl carotid upstrokes bilaterally   LUNGS: no acc muscle use,  Nl contour chest which is clear to A and P bilaterally without cough on insp or exp maneuvers   CV:  RRR  no s3 or murmur or increase in P2, and no edema   ABD:  soft and nontender with nl inspiratory excursion in the supine position. No bruits or organomegaly appreciated, bowel sounds nl  MS:  Nl gait/ ext warm without deformities, calf tenderness, cyanosis or clubbing No obvious joint restrictions   SKIN: warm and dry without lesions    NEURO:  alert, approp, nl sensorium with  no motor or cerebellar deficits apparent.       Assessment   Upper airway cough syndrome Recurrent since 2013  - flared 1st part of July 2022 p ? Exp to moldy flooring p a/c leak - Allergy profile 10/30/2020 >  Eos 0. /  IgE  Pending   Upper airway cough syndrome (previously labeled PNDS),  is so named because it's frequently impossible to sort out how much is  CR/sinusitis with freq throat clearing (which can be related to primary GERD)   vs  causing  secondary (" extra esophageal")  GERD from wide swings in gastric pressure that occur with throat clearing, often  promoting self use of mint and menthol lozenges that reduce the lower esophageal sphincter tone and exacerbate the problem further in a cyclical fashion.   These are the same pts (now being labeled as having "irritable larynx syndrome" by some cough centers) who not infrequently have a history of having failed to tolerate ace inhibitors,  dry powder inhalers or biphosphonates or report having atypical/extraesophageal reflux symptoms that don't respond to standard doses of PPI  and are easily confused as having aecopd or asthma flares by even experienced allergists/ pulmonologists (myself included).   For now rec Resume singulair  Max rx for gerd while coughing to prevent cyclical  cough in pt on bcps (increase risk gerd) Prednisone 10 mg take  4 each am x 2 days,   2 each am x 2 days,  1 each am x 2 days and stop  Continue promethazine dm to reduce risk of cyclcal cough eliminate mold from enviroment and minimize voice use.  Check allergy profile pending          Each  maintenance medication was reviewed in detail including emphasizing most importantly the difference between maintenance and prns and under what circumstances the prns are to be triggered using an action plan format where appropriate.  Total time for H and P, chart review, counseling,  and generating customized AVS unique to this new pt office visit / same day charting = 32mn       F/u in 4-6 weeks, call sooner if needed     MChristinia Gully MD 10/30/2020

## 2020-10-30 NOTE — Assessment & Plan Note (Signed)
Recurrent since 2013  - flared 1st part of July 2022 p ? Exp to moldy flooring p a/c leak - Allergy profile 10/30/2020 >  Eos 0. /  IgE  Pending   Upper airway cough syndrome (previously labeled PNDS),  is so named because it's frequently impossible to sort out how much is  CR/sinusitis with freq throat clearing (which can be related to primary GERD)   vs  causing  secondary (" extra esophageal")  GERD from wide swings in gastric pressure that occur with throat clearing, often  promoting self use of mint and menthol lozenges that reduce the lower esophageal sphincter tone and exacerbate the problem further in a cyclical fashion.   These are the same pts (now being labeled as having "irritable larynx syndrome" by some cough centers) who not infrequently have a history of having failed to tolerate ace inhibitors,  dry powder inhalers or biphosphonates or report having atypical/extraesophageal reflux symptoms that don't respond to standard doses of PPI  and are easily confused as having aecopd or asthma flares by even experienced allergists/ pulmonologists (myself included).   For now rec Resume singulair  Max rx for gerd while coughing to prevent cyclical cough in pt on bcps (increase risk gerd) Prednisone 10 mg take  4 each am x 2 days,   2 each am x 2 days,  1 each am x 2 days and stop  Continue promethazine dm to reduce risk of cyclcal cough eliminate mold from enviroment and minimize voice use.  Check allergy profile pending          Each maintenance medication was reviewed in detail including emphasizing most importantly the difference between maintenance and prns and under what circumstances the prns are to be triggered using an action plan format where appropriate.  Total time for H and P, chart review, counseling,  and generating customized AVS unique to this new pt office visit / same day charting = 44mn       F/u in 4-6 weeks, call sooner if needed

## 2020-10-30 NOTE — Patient Instructions (Addendum)
Singulair 10 mg one daily in pm until return  For cough >>  Promethazine dm   1 tsp 4 x daily   For drainage / throat tickle try take CHLORPHENIRAMINE  4 mg  (Chlortab '4mg'$   at [employer] should be easiest to find in the green box)  take one every 4 hours as needed - available over the counter- may cause drowsiness so start with just a dose or two an hour before bedtime and see how you tolerate it before trying in daytime    Prednisone 10 mg take  4 each am x 2 days,   2 each am x 2 days,  1 each am x 2 days and stop   Try prilosec otc '20mg'$   Take 30-60 min before first meal of the day and Pepcid ac (famotidine) 20 mg one after supper until cough is completely gone for at least a week without the need for cough suppression   GERD (REFLUX)  is an extremely common cause of respiratory symptoms just like yours , many times with no obvious heartburn at all.    It can be treated with medication, but also with lifestyle changes including elevation of the head of your bed (ideally with 6 -8inch blocks under the headboard of your bed),  Smoking cessation, avoidance of late meals, excessive alcohol, and avoid fatty foods, chocolate, peppermint, colas, red wine, and acidic juices such as orange juice.  NO MINT OR MENTHOL PRODUCTS SO NO COUGH DROPS  USE SUGARLESS CANDY INSTEAD (Jolley ranchers or Stover's or Life Savers) or even ice chips will also do - the key is to swallow to prevent all throat clearing. NO OIL BASED VITAMINS - use powdered substitutes.  Avoid fish oil when coughing.   Please remember to go to the lab department   for your tests - we will call you with the results when they are available.  Please schedule a follow up office visit in 4 -6  weeks, sooner if needed

## 2020-10-31 LAB — IGE: IgE (Immunoglobulin E), Serum: 112 kU/L (ref <=114)

## 2020-11-03 ENCOUNTER — Telehealth: Payer: Self-pay | Admitting: Internal Medicine

## 2020-11-03 ENCOUNTER — Encounter: Payer: Self-pay | Admitting: *Deleted

## 2020-11-03 NOTE — Telephone Encounter (Signed)
pt dropped off ADA forms, states that her and Dr. 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 Alvira Monday been placed in [employer]

## 2020-11-04 NOTE — Telephone Encounter (Addendum)
ADA documents emailed to [employer] for processing

## 2020-11-06 ENCOUNTER — Telehealth: Payer: Self-pay | Admitting: Internal Medicine

## 2020-11-07 ENCOUNTER — Encounter: Payer: Self-pay | Admitting: Nurse Practitioner

## 2020-11-07 ENCOUNTER — Ambulatory Visit (INDEPENDENT_AMBULATORY_CARE_PROVIDER_SITE_OTHER): Payer: Managed Care, Other (non HMO) | Admitting: Nurse Practitioner

## 2020-11-07 ENCOUNTER — Other Ambulatory Visit: Payer: Self-pay

## 2020-11-07 VITALS — BP 104/70 | HR 96 | Temp 97.7°F | Ht 61.75 in | Wt 152.2 lb

## 2020-11-07 DIAGNOSIS — Z13220 Encounter for screening for lipoid disorders: Secondary | ICD-10-CM | POA: Diagnosis not present

## 2020-11-07 DIAGNOSIS — Z23 Encounter for immunization: Secondary | ICD-10-CM

## 2020-11-07 DIAGNOSIS — Z1211 Encounter for screening for malignant neoplasm of colon: Secondary | ICD-10-CM

## 2020-11-07 DIAGNOSIS — N920 Excessive and frequent menstruation with regular cycle: Secondary | ICD-10-CM | POA: Insufficient documentation

## 2020-11-07 DIAGNOSIS — Z136 Encounter for screening for cardiovascular disorders: Secondary | ICD-10-CM

## 2020-11-07 DIAGNOSIS — Z0000 Encounter for general adult medical examination without abnormal findings: Secondary | ICD-10-CM | POA: Diagnosis not present

## 2020-11-07 DIAGNOSIS — R9230 Dense breasts, unspecified: Secondary | ICD-10-CM

## 2020-11-07 DIAGNOSIS — R922 Inconclusive mammogram: Secondary | ICD-10-CM | POA: Insufficient documentation

## 2020-11-07 HISTORY — DX: Inconclusive mammogram: R92.2

## 2020-11-07 HISTORY — DX: Dense breasts, unspecified: R92.30

## 2020-11-07 NOTE — Progress Notes (Signed)
Subjective:    Patient ID: Shelley Norman, female    DOB: 28-Nov-1970, 50 y.o.   MRN: [id number]  Patient presents today for CPE   HPI Transfer care from Precious Haws with [facility] Agreed to Shingrix and pneumovax 20 vaccines today. Denies need for STD screen today. Uptodate with PAP and mammogram. Completed by GYN: Earnstine Regal.  Vision:up to date Dental:up to date Diet:heart healthy Exercise:walking Weight:  Wt Readings from Last 3 Encounters:  11/07/20 152 lb 3.2 oz (69 kg)  10/30/20 149 lb 12.8 oz (67.9 kg)  06/03/16 153 lb 6.4 oz (69.6 kg)    Depression/Suicide: Depression screen [facility] 2/9 11/07/2020 03/23/2016 01/10/2013  Decreased Interest 0 0 0  Down, Depressed, Hopeless 0 0 0  PHQ - 2 Score 0 0 0  Altered sleeping 0 - -  Tired, decreased energy 2 - -  Change in appetite 0 - -  Feeling bad or failure about yourself  0 - -  Trouble concentrating 0 - -  Moving slowly or fidgety/restless 0 - -  Suicidal thoughts 0 - -  PHQ-9 Score 2 - -  Difficult doing work/chores Not difficult at all - -   Immunizations: (TDAP, Hep C screen, Pneumovax, Influenza, zoster)  Health Maintenance  Topic Date Due   Pneumococcal Vaccination (2 - PCV) 02/09/2012   Colon Cancer Screening  Never done   Mammogram  04/07/2016   Pap Smear  04/08/2018   COVID-19 Vaccine (4 - Booster) 05/03/2020   Flu Shot  10/27/2020   Hepatitis C Screening: USPSTF Recommendation to screen - Ages 18-79 yo.  11/07/2021*   Zoster (Shingles) Vaccine (2 of 2) 01/02/2021   Tetanus Vaccine  03/23/2026   Pneumococcal vaccine  Completed   HIV Screening  Completed   HPV Vaccine  Aged Out   Complete foot exam   Discontinued   Hemoglobin A1C  Discontinued   Eye exam for diabetics  Discontinued   Urine Protein Check  Discontinued  *Topic was postponed. The date shown is not the original due date.   Fall Risk: Fall Risk  11/07/2020  Falls in the past year? 0  Number falls in past yr: 0  Injury with Fall? 0  Risk  for fall due to : No Fall Risks  Follow up Falls evaluation completed   Medications and allergies reviewed with patient and updated if appropriate.  Patient Active Problem List   Diagnosis Date Noted   Menorrhagia 11/07/2020   Upper airway cough syndrome 10/30/2020   Lateral epicondylitis of right elbow 08/07/2020   Mastodynia 01/05/2018   Vaginospasm 01/05/2018   Osteoarthritis of left knee 09/08/2017   Mouth ulcers 11/22/2014   Dense breasts 04/26/2012   Uterine leiomyoma 02/01/2012   COUGH, CHRONIC 04/17/2010   KNEE PAIN, RIGHT 03/24/2009   Allergic rhinitis due to pollen 08/15/2008   WEIGHT GAIN 08/15/2008   Vitamin D deficiency 04/05/2008   OVERWEIGHT 02/26/2008   HAND PAIN, LEFT 02/26/2008   Osteoarthritis 02/07/2008   Bunion, left foot 05/16/2007   ANKLE SPRAIN 05/16/2007   FOOT SURGERY, HX OF 03/03/2007    Current Outpatient Medications on File Prior to Visit  Medication Sig Dispense Refill   Bee Pollen 1000 MG TABS Take 1 tablet by mouth daily.       Biotin 2.5 MG TABS Take 1 tablet by mouth daily.       Cholecalciferol (VITAMIN D3) 2000 UNITS TABS Take 1 tablet by mouth daily.       fish oil-omega-3  fatty acids 1000 MG capsule Take 1 g by mouth 2 (two) times daily.       ibuprofen (ADVIL,MOTRIN) 600 MG tablet 1  po  pc every 6 hours for 5 days then prn-pain 30 tablet 1   loratadine (CLARITIN) 10 MG tablet Take 10 mg by mouth daily as needed for allergies.      Magnesium 250 MG TABS Take 1 tablet by mouth daily.       Misc Natural Products (OSTEO BI-FLEX ADV DOUBLE ST PO) Take 1 tablet by mouth 2 (two) times daily.       montelukast (SINGULAIR) 10 MG tablet Take 1 tablet (10 mg total) by mouth at bedtime. 30 tablet 11   Norgestimate-Ethinyl Estradiol Triphasic (ORTHO TRI-CYCLEN, 28,) 0.18/0.215/0.25 MG-35 MCG tablet Take 1 tablet by mouth daily. 3 Package 3   promethazine-dextromethorphan (PROMETHAZINE-DM) 6.25-15 MG/5ML syrup Take 5 mLs by mouth 4 (four) times daily  as needed for cough. 240 mL 1   PROMETHAZINE-DM PO Take by mouth.     VOLTAREN 1 % GEL Apply 2 g topically 2 (two) times daily as needed (arthritis pain in knee).      No current facility-administered medications on file prior to visit.   Past Medical History:  Diagnosis Date   Allergic rhinitis    Bacterial infection    Dense breast tissue 11/07/2020   Fibroids    Mastodynia    Osteoarthritis    feet, knees, lower back - no meds   PONV (postoperative nausea and vomiting)    Vaginismus    Vitamin D deficiency    Yeast infection     Past Surgical History:  Procedure Laterality Date   FOOT SURGERY     bilateral 2 right and 2 left foot surgeries - totatl 4   HAMMER TOE SURGERY  2007   left foot - toe next to pinky   MYOMECTOMY N/A 01/08/2014   Procedure: Abdominal MYOMECTOMY;  Surgeon: Eldred Manges, MD;  Location: [facility];  Service: Gynecology;  Laterality: N/A;   WISDOM TOOTH EXTRACTION      Social History   Socioeconomic History   Marital status: Single    Spouse name: Not on file   Number of children: Not on file   Years of education: Not on file   Highest education level: Not on file  Occupational History   Occupation: Consultant  Tobacco Use   Smoking status: Never   Smokeless tobacco: Never  Substance and Sexual Activity   Alcohol use: Yes    Alcohol/week: 1.0 standard drink    Types: 1 Glasses of wine per week    Comment: socially   Drug use: No   Sexual activity: Not Currently    Partners: Male    Birth control/protection: Pill  Other Topics Concern   Not on file  Social History Narrative   Exercise---  daily   Social Determinants of Health   Financial Resource Strain: Not on file  Food Insecurity: Not on file  Transportation Needs: Not on file  Physical Activity: Not on file  Stress: Not on file  Social Connections: Not on file    Family History  Problem Relation Age of Onset   Hypertension Mother    Hyperlipidemia Mother    Arthritis  Mother    Diabetes Father    Osteoporosis Other    Breast cancer Sister 62   Breast cancer Sister 69       Review of Systems  Constitutional:  Negative for fever,  malaise/fatigue and weight loss.  HENT:  Negative for congestion and sore throat.   Eyes:        Negative for visual changes  Respiratory:  Negative for cough and shortness of breath.   Cardiovascular:  Negative for chest pain, palpitations and leg swelling.  Gastrointestinal:  Negative for blood in stool, constipation, diarrhea and heartburn.  Genitourinary:  Negative for dysuria, frequency and urgency.  Musculoskeletal:  Negative for falls, joint pain and myalgias.  Skin:  Negative for rash.  Neurological:  Negative for dizziness, sensory change and headaches.  Endo/Heme/Allergies:  Does not bruise/bleed easily.  Psychiatric/Behavioral:  Negative for depression, substance abuse and suicidal ideas. The patient is not nervous/anxious.    Objective:   Vitals:   11/07/20 1253  BP: 104/70  Pulse: 96  Temp: 97.7 F (36.5 C)  SpO2: 98%    Body mass index is 28.06 kg/m.   Physical Examination:  Physical Exam Vitals reviewed.  Constitutional:      General: She is not in acute distress.    Appearance: She is well-developed.  HENT:     Right Ear: Tympanic membrane, ear canal and external ear normal.     Left Ear: Tympanic membrane, ear canal and external ear normal.  Eyes:     Extraocular Movements: Extraocular movements intact.     Conjunctiva/sclera: Conjunctivae normal.  Cardiovascular:     Rate and Rhythm: Normal rate and regular rhythm.     Pulses: Normal pulses.     Heart sounds: Normal heart sounds.  Pulmonary:     Effort: Pulmonary effort is normal. No respiratory distress.     Breath sounds: Normal breath sounds.  Chest:     Chest wall: No tenderness.  Abdominal:     General: Bowel sounds are normal.     Palpations: Abdomen is soft.  Genitourinary:    Comments: Deferred breast and pelvic exam  to GYN per patient Musculoskeletal:        General: Normal range of motion.     Cervical back: Normal range of motion and neck supple.     Right lower leg: No edema.     Left lower leg: No edema.  Skin:    General: Skin is warm and dry.  Neurological:     Mental Status: She is alert and oriented to person, place, and time.     Deep Tendon Reflexes: Reflexes are normal and symmetric.  Psychiatric:        Mood and Affect: Mood normal.        Behavior: Behavior normal.        Thought Content: Thought content normal.   ASSESSMENT and PLAN: This visit occurred during the SARS-CoV-2 public health emergency.  Safety protocols were in place, including screening questions prior to the visit, additional usage of staff PPE, and extensive cleaning of exam room while observing appropriate contact time as indicated for disinfecting solutions.   Brandon was seen today for annual exam.  Diagnoses and all orders for this visit:  Preventative health care -     Comprehensive metabolic panel; Future -     TSH; Future -     Lipid panel; Future -     Ambulatory referral to Gastroenterology  Encounter for lipid screening for cardiovascular disease -     Lipid panel; Future  Colon cancer screening -     Ambulatory referral to Gastroenterology  Need for shingles vaccine -     Varicella-zoster vaccine IM  Need for pneumococcal vaccine -  Pneumococcal conjugate vaccine 20-valent (Prevnar 20)     Problem List Items Addressed This Visit   None Visit Diagnoses     Preventative health care    -  Primary   Relevant Orders   Comprehensive metabolic panel   TSH   Lipid panel   Ambulatory referral to Gastroenterology   Encounter for lipid screening for cardiovascular disease       Relevant Orders   Lipid panel   Colon cancer screening       Relevant Orders   Ambulatory referral to Gastroenterology   Need for shingles vaccine       Relevant Orders   Varicella-zoster vaccine IM (Completed)    Need for pneumococcal vaccine       Relevant Orders   Pneumococcal conjugate vaccine 20-valent (Prevnar 20) (Completed)       Follow up: Return in about 1 year (around 11/07/2021) for CPE (fasting).  Wilfred Lacy, NP

## 2020-11-07 NOTE — Telephone Encounter (Signed)
Patient called in to check status of ADA forms, she was upset that when she dropped them off she was not told 8-10 business days for completion - I explained we have them and Sharl Ma will work on them in order to get Dr. Melvyn Novas to sign them when he is in the office on 8/15.  Deadline for submittal is close of business 8/15.  Also told her I would put a hard copy in an envelope for her to pick up next week.  Patient was satisfied with this answer.

## 2020-11-07 NOTE — Patient Instructions (Signed)
Thank you for choosing [facility]  Schedule lab appt for fasting blood draw. You will need to be fasting 6-8hrs prior to blood draw.  Sign medical release form to get your records GYN (PAP and mammogram).  Preventive Care 97-51 Years Old, Female Preventive care refers to lifestyle choices and visits with your health care provider that can promote health and wellness. This includes: A yearly physical exam. This is also called an annual wellness visit. Regular dental and eye exams. Immunizations. Screening for certain conditions. Healthy lifestyle choices, such as: Eating a healthy diet. Getting regular exercise. Not using drugs or products that contain nicotine and tobacco. Limiting alcohol use. What can I expect for my preventive care visit? Physical exam Your health care provider will check your: Height and weight. These may be used to calculate your BMI (body mass index). BMI is a measurement that tells if you are at a healthy weight. Heart rate and blood pressure. Body temperature. Skin for abnormal spots. Counseling Your health care provider may ask you questions about your: Past medical problems. Family's medical history. Alcohol, tobacco, and drug use. Emotional well-being. Home life and relationship well-being. Sexual activity. Diet, exercise, and sleep habits. Work and work Statistician. Access to firearms. Method of birth control. Menstrual cycle. Pregnancy history. What immunizations do I need?  Vaccines are usually given at various ages, according to a schedule. Your health care provider will recommend vaccines for you based on your age, medicalhistory, and lifestyle or other factors, such as travel or where you work. What tests do I need? Blood tests Lipid and cholesterol levels. These may be checked every 5 years, or more often if you are over 79 years old. Hepatitis C test. Hepatitis B test. Screening Lung cancer screening. You may have this  screening every year starting at age 57 if you have a 30-pack-year history of smoking and currently smoke or have quit within the past 15 years. Colorectal cancer screening. All adults should have this screening starting at age 83 and continuing until age 20. Your health care provider may recommend screening at age 60 if you are at increased risk. You will have tests every 1-10 years, depending on your results and the type of screening test. Diabetes screening. This is done by checking your blood sugar (glucose) after you have not eaten for a while (fasting). You may have this done every 1-3 years. Mammogram. This may be done every 1-2 years. Talk with your health care provider about when you should start having regular mammograms. This may depend on whether you have a family history of breast cancer. BRCA-related cancer screening. This may be done if you have a family history of breast, ovarian, tubal, or peritoneal cancers. Pelvic exam and Pap test. This may be done every 3 years starting at age 62. Starting at age 34, this may be done every 5 years if you have a Pap test in combination with an HPV test. Other tests STD (sexually transmitted disease) testing, if you are at risk. Bone density scan. This is done to screen for osteoporosis. You may have this scan if you are at high risk for osteoporosis. Talk with your health care provider about your test results, treatment options,and if necessary, the need for more tests. Follow these instructions at home: Eating and drinking  Eat a diet that includes fresh fruits and vegetables, whole grains, lean protein, and low-fat dairy products. Take vitamin and mineral supplements as recommended by your health care provider. Do not  drink alcohol if: Your health care provider tells you not to drink. You are pregnant, may be pregnant, or are planning to become pregnant. If you drink alcohol: Limit how much you have to 0-1 drink a day. Be aware of  how much alcohol is in your drink. In the [place] one drink equals one 12 oz bottle of beer (355 mL), one 5 oz glass of wine (148 mL), or one 1 oz glass of hard liquor (44 mL).  Lifestyle Take daily care of your teeth and gums. Brush your teeth every morning and night with fluoride toothpaste. Floss one time each day. Stay active. Exercise for at least 30 minutes 5 or more days each week. Do not use any products that contain nicotine or tobacco, such as cigarettes, e-cigarettes, and chewing tobacco. If you need help quitting, ask your health care provider. Do not use drugs. If you are sexually active, practice safe sex. Use a condom or other form of protection to prevent STIs (sexually transmitted infections). If you do not wish to become pregnant, use a form of birth control. If you plan to become pregnant, see your health care provider for a prepregnancy visit. If told by your health care provider, take low-dose aspirin daily starting at age 51. Find healthy ways to cope with stress, such as: Meditation, yoga, or listening to music. Journaling. Talking to a trusted person. Spending time with friends and family. Safety Always wear your seat belt while driving or riding in a vehicle. Do not drive: If you have been drinking alcohol. Do not ride with someone who has been drinking. When you are tired or distracted. While texting. Wear a helmet and other protective equipment during sports activities. If you have firearms in your house, make sure you follow all gun safety procedures. What's next? Visit your health care provider once a year for an annual wellness visit. Ask your health care provider how often you should have your eyes and teeth checked. Stay up to date on all vaccines. This information is not intended to replace advice given to you by your health care provider. Make sure you discuss any questions you have with your healthcare provider. Document Revised: 12/18/2019 Document  Reviewed: 11/24/2017 Elsevier Patient Education  2022 [employer].

## 2020-11-10 NOTE — Telephone Encounter (Signed)
Patient called to check status of ADA forms, I spoke to her and explained they are not complete.  I called the [facility] office and was unable to leave a voice message for her case worker Engineer, water.  So I sent email to [email or web address], attn: Charma Igo and said that we would try our best to have the forms submitted by August 19.  I put patient on for a copy of the email.

## 2020-11-12 NOTE — Telephone Encounter (Signed)
Forms emailed to [place] to have Dr. Melvyn Novas to sign-pr

## 2020-11-13 ENCOUNTER — Other Ambulatory Visit (INDEPENDENT_AMBULATORY_CARE_PROVIDER_SITE_OTHER): Payer: Managed Care, Other (non HMO)

## 2020-11-13 ENCOUNTER — Other Ambulatory Visit: Payer: Self-pay

## 2020-11-13 DIAGNOSIS — Z0000 Encounter for general adult medical examination without abnormal findings: Secondary | ICD-10-CM

## 2020-11-13 DIAGNOSIS — Z136 Encounter for screening for cardiovascular disorders: Secondary | ICD-10-CM

## 2020-11-13 DIAGNOSIS — Z13220 Encounter for screening for lipoid disorders: Secondary | ICD-10-CM

## 2020-11-13 LAB — COMPREHENSIVE METABOLIC PANEL
ALT: 12 U/L (ref 0–35)
AST: 15 U/L (ref 0–37)
Albumin: 3.8 g/dL (ref 3.5–5.2)
Alkaline Phosphatase: 53 U/L (ref 39–117)
BUN: 9 mg/dL (ref 6–23)
CO2: 23 mEq/L (ref 19–32)
Calcium: 9.2 mg/dL (ref 8.4–10.5)
Chloride: 103 mEq/L (ref 96–112)
Creatinine, Ser: 1.06 mg/dL (ref 0.40–1.20)
GFR: 60.91 mL/min (ref >60.00)
Glucose, Bld: 84 mg/dL (ref 70–99)
Potassium: 4.3 mEq/L (ref 3.5–5.1)
Sodium: 136 mEq/L (ref 135–145)
Total Bilirubin: 0.4 mg/dL (ref 0.2–1.2)
Total Protein: 6.9 g/dL (ref 6.0–8.3)

## 2020-11-13 LAB — LIPID PANEL
Cholesterol: 199 mg/dL (ref 0–200)
HDL: 69.7 mg/dL (ref >39.00)
LDL Cholesterol: 95 mg/dL (ref 0–99)
NonHDL: 128.83
Total CHOL/HDL Ratio: 3
Triglycerides: 168 mg/dL — ABNORMAL HIGH (ref 0.0–149.0)
VLDL: 33.6 mg/dL (ref 0.0–40.0)

## 2020-11-13 LAB — TSH: TSH: 1.83 u[IU]/mL (ref 0.35–5.50)

## 2020-11-18 NOTE — Telephone Encounter (Signed)
ADA forms were signed by Dr. Melvyn Novas and faxed to [place]  Attn: Charma Igo  fax# [phone].  Mailed hard copy of documents to patient. Also called and spoke to patient to let her know this is taken care of

## 2020-11-21 ENCOUNTER — Encounter: Payer: Self-pay | Admitting: Gastroenterology

## 2020-11-21 NOTE — Telephone Encounter (Signed)
Returned patient's call and asked her to call back if she has not received the disability forms I mailed to her on 8/23.

## 2020-11-27 ENCOUNTER — Telehealth: Payer: Self-pay | Admitting: Internal Medicine

## 2020-11-27 ENCOUNTER — Other Ambulatory Visit: Payer: Self-pay | Admitting: Obstetrics and Gynecology

## 2020-11-27 DIAGNOSIS — Z1231 Encounter for screening mammogram for malignant neoplasm of breast: Secondary | ICD-10-CM

## 2020-11-27 NOTE — Telephone Encounter (Signed)
Called patient but she did not answer. Left message for her to call back.  

## 2020-11-27 NOTE — Telephone Encounter (Signed)
ATC, left VM. 

## 2020-11-27 NOTE — Telephone Encounter (Signed)
Patient is returning phone call. Patient phone number is [phone].

## 2020-11-28 NOTE — Telephone Encounter (Signed)
I have attempted to call the pt but no answer--the line just rings busy.

## 2020-11-28 NOTE — Telephone Encounter (Signed)
Spoke with pt who is requesting order for tussinex r/t the fact that promethazine dm not working. Pt states that she has tried promethazine dm for 4 weeks now and it has not helped. Dr. Melvyn Novas please advise.

## 2020-11-28 NOTE — Telephone Encounter (Signed)
Sorry but that's a heavily addicting cough med I quit using over 10 y ago   Will need ov with all meds in hand to re-eval - may still have availability 9/7 once the after the afternoon is opened or she can see np or go to UC   If comes to our office needs all active meds in hand

## 2020-11-28 NOTE — Telephone Encounter (Signed)
Called and spoke with patient, advised her of the recommendations per Dr. Melvyn Novas.  She states that she has been prescribed Tussinex in the past and it is in her records (PCP office), she is not a drug seeker and it is the only thing that has taken care of the cough.  She states she has used 3 bottles of the promethazine-dextromethorphan and it does help some, however, it makes her sleepy.  If she has something to do early in the morning, she cannot take it the night before.  When she skipped one night, the cough came back with a vengeance.  She does not see much point in getting a 4th bottle of a cough medication that does not take care of the cough.  She has done all of the things that Dr. Melvyn Novas has asked her to do as far as holding off of the fish oil, biotin, ect.  She is taking the singular, Prilosec and pepcid.  She is watching her diet and nothing has made a difference.  I offered her an appointment with an app and she declined since she already has an appointment with Dr. Melvyn Novas in October and she has a $50 copay each time she comes in.  Advised to call if she changed her mind.  She said she would contact her pcp and see if they would prescribe it.  Nothing further needed.

## 2020-12-03 ENCOUNTER — Ambulatory Visit (AMBULATORY_SURGERY_CENTER): Payer: Managed Care, Other (non HMO)

## 2020-12-03 ENCOUNTER — Other Ambulatory Visit: Payer: Self-pay

## 2020-12-03 VITALS — Ht 60.75 in | Wt 153.0 lb

## 2020-12-03 DIAGNOSIS — Z1211 Encounter for screening for malignant neoplasm of colon: Secondary | ICD-10-CM

## 2020-12-03 MED ORDER — PEG-KCL-NACL-NASULF-NA ASC-C 100 G PO SOLR: 1.0000 | Freq: Once | ORAL | 0 refills | Status: AC

## 2020-12-03 NOTE — Progress Notes (Signed)
  Patient's pre-visit was done today over the phone with the patient   Name,DOB and address verified.   Patient denies any allergies to Eggs and Soy.  Patient denies any problems with anesthesia/sedation. Patient denies taking diet pills or blood thinners.  Denies atrial flutter or atrial fib Denies chronic constipation No home Oxygen.   Packet of Prep instructions mailed to patient including a copy of a consent form-pt is aware.  Patient understands to call us back with any questions or concerns.  Patient is aware of our care-partner policy and Covid-19 safety protocol.   The patient is COVID-19 vaccinated.    

## 2020-12-08 ENCOUNTER — Encounter: Payer: Self-pay | Admitting: Gastroenterology

## 2020-12-10 ENCOUNTER — Telehealth: Payer: Self-pay | Admitting: Internal Medicine

## 2020-12-10 NOTE — Telephone Encounter (Signed)
Will forward over to [place] to complete.

## 2020-12-10 NOTE — Telephone Encounter (Signed)
Patient sent me an email stating the signed ADA form was never received by her employer [employer].  I faxed it again to attn:  Charma Igo, fax# [phone] and also sent copy of the signed form via email to the patient.

## 2020-12-10 NOTE — Telephone Encounter (Signed)
Rec'd another email from patient and she said Richarda Blade wants specific documentation stating she cannot be on the phones.  I have emailed Patrice to see if we can update the signed form and get Dr. Lamonte Sakai to initial the changes, or if we need to fill the form out again with updated info and have him sign it again.  Also asked if patient needs to be seen by Dr. Lamonte Sakai before this can be done.  Patient was last seen in office on 8/4.

## 2020-12-11 ENCOUNTER — Encounter: Payer: Managed Care, Other (non HMO) | Admitting: Gastroenterology

## 2020-12-11 ENCOUNTER — Ambulatory Visit: Payer: Managed Care, Other (non HMO) | Admitting: Internal Medicine

## 2020-12-12 ENCOUNTER — Telehealth: Payer: Self-pay | Admitting: Gastroenterology

## 2020-12-12 DIAGNOSIS — Z1211 Encounter for screening for malignant neoplasm of colon: Secondary | ICD-10-CM

## 2020-12-12 MED ORDER — SUTAB 1479-225-188 MG PO TABS: 1.0000 | ORAL_TABLET | ORAL | 0 refills | Status: DC

## 2020-12-12 NOTE — Telephone Encounter (Signed)
Pt is requesting Sutab instead of Moviprep. She prefers tablets if possible. Pls send it to [facility] on [address] in [place]

## 2020-12-12 NOTE — Telephone Encounter (Signed)
Sutab rx with coupon sent to pt's pharmacy and new prep instructions sent to mychart. Patient is aware.

## 2020-12-15 NOTE — Telephone Encounter (Signed)
ADA form was updated to say "patient cannot work more than 1 hour on the phone per shift".  Changes were initialed and dated by Rexene Edison, NP on 9/16.  Faxed on 9/19 to [employer] at [facility] fax# [phone].  Emailed copy of updated form to patient.

## 2020-12-18 ENCOUNTER — Encounter: Payer: Self-pay | Admitting: Gastroenterology

## 2020-12-18 ENCOUNTER — Ambulatory Visit (AMBULATORY_SURGERY_CENTER): Payer: Managed Care, Other (non HMO) | Admitting: Gastroenterology

## 2020-12-18 ENCOUNTER — Other Ambulatory Visit: Payer: Self-pay

## 2020-12-18 VITALS — BP 123/84 | HR 72 | Temp 98.2°F | Resp 18 | Ht 61.75 in | Wt 153.0 lb

## 2020-12-18 DIAGNOSIS — D122 Benign neoplasm of ascending colon: Secondary | ICD-10-CM

## 2020-12-18 DIAGNOSIS — Z1211 Encounter for screening for malignant neoplasm of colon: Secondary | ICD-10-CM

## 2020-12-18 DIAGNOSIS — K635 Polyp of colon: Secondary | ICD-10-CM

## 2020-12-18 MED ORDER — SODIUM CHLORIDE 0.9 % IV SOLN: 500.0000 mL | Freq: Once | INTRAVENOUS | Status: DC

## 2020-12-18 NOTE — Progress Notes (Signed)
Called to room to assist during endoscopic procedure.  Patient ID and intended procedure confirmed with present staff. Received instructions for my participation in the procedure from the performing physician.  

## 2020-12-18 NOTE — Patient Instructions (Signed)
Information on polyps given to you today.  Await pathology results.  Resume previous diet and medications.  Repeat colonoscopy in 10years.   YOU HAD AN ENDOSCOPIC PROCEDURE TODAY AT [facility]:   Refer to the procedure report that was given to you for any specific questions about what was found during the examination.  If the procedure report does not answer your questions, please call your gastroenterologist to clarify.  If you requested that your care partner not be given the details of your procedure findings, then the procedure report has been included in a sealed envelope for you to review at your convenience later.  YOU SHOULD EXPECT: Some feelings of bloating in the abdomen. Passage of more gas than usual.  Walking can help get rid of the air that was put into your GI tract during the procedure and reduce the bloating. If you had a lower endoscopy (such as a colonoscopy or flexible sigmoidoscopy) you may notice spotting of blood in your stool or on the toilet paper. If you underwent a bowel prep for your procedure, you may not have a normal bowel movement for a few days.  Please Note:  You might notice some irritation and congestion in your nose or some drainage.  This is from the oxygen used during your procedure.  There is no need for concern and it should clear up in a day or so.  SYMPTOMS TO REPORT IMMEDIATELY:  Following lower endoscopy (colonoscopy or flexible sigmoidoscopy):  Excessive amounts of blood in the stool  Significant tenderness or worsening of abdominal pains  Swelling of the abdomen that is new, acute  Fever of 100F or higher   For urgent or emergent issues, a gastroenterologist can be reached at any hour by calling [phone]. Do not use MyChart messaging for urgent concerns.    DIET:  We do recommend a small meal at first, but then you may proceed to your regular diet.  Drink plenty of fluids but you should avoid alcoholic beverages for  24 hours.  ACTIVITY:  You should plan to take it easy for the rest of today and you should NOT DRIVE or use heavy machinery until tomorrow (because of the sedation medicines used during the test).    FOLLOW UP: Our staff will call the number listed on your records 48-72 hours following your procedure to check on you and address any questions or concerns that you may have regarding the information given to you following your procedure. If we do not reach you, we will leave a message.  We will attempt to reach you two times.  During this call, we will ask if you have developed any symptoms of COVID 19. If you develop any symptoms (ie: fever, flu-like symptoms, shortness of breath, cough etc.) before then, please call [phone].  If you test positive for Covid 19 in the 2 weeks post procedure, please call and report this information to [place].    If any biopsies were taken you will be contacted by phone or by letter within the next 1-3 weeks.  Please call us at [phone] if you have not heard about the biopsies in 3 weeks.    SIGNATURES/CONFIDENTIALITY: You and/or your care partner have signed paperwork which will be entered into your electronic medical record.  These signatures attest to the fact that that the information above on your After Visit Summary has been reviewed and is understood.  Full responsibility of the confidentiality of this discharge information lies  with you and/or your care-partner.  

## 2020-12-18 NOTE — Progress Notes (Signed)
VS taken by DT 

## 2020-12-18 NOTE — Progress Notes (Signed)
[facility] History and Physical   Primary Care Physician:  Nche, Charlene Brooke, NP   Reason for Procedure:   Colon cancer screening  Plan:    Screening colonoscopy     HPI: Shelley Norman is a 50 y.o. female undergoing initial average risk screening colonoscopy.  She has no chronic GI symptoms and no family history of colon cancer.   Past Medical History:  Diagnosis Date   Allergic rhinitis    Bacterial infection    Dense breast tissue 11/07/2020   Fibroids    Mastodynia    Osteoarthritis    feet, knees, lower back - no meds   PONV (postoperative nausea and vomiting)    Vaginismus    Vitamin D deficiency    Yeast infection     Past Surgical History:  Procedure Laterality Date   FOOT SURGERY     bilateral 2 right and 2 left foot surgeries - totatl 4   HAMMER TOE SURGERY  2007   left foot - toe next to pinky   MYOMECTOMY N/A 01/08/2014   Procedure: Abdominal MYOMECTOMY;  Surgeon: Eldred Manges, MD;  Location: [facility];  Service: Gynecology;  Laterality: N/A;   WISDOM TOOTH EXTRACTION      Prior to Admission medications   Medication Sig Start Date End Date Taking? Authorizing Provider  Biotin 2.5 MG TABS Take 1 tablet by mouth daily.     Yes [provider]  Cholecalciferol (VITAMIN D3) 2000 UNITS TABS Take 1 tablet by mouth daily.     Yes [provider]  Magnesium 250 MG TABS Take 1 tablet by mouth daily.     Yes [provider]  montelukast (SINGULAIR) 10 MG tablet Take 1 tablet (10 mg total) by mouth at bedtime. 10/30/20  Yes Tanda Rockers, MD  norethindrone-ethinyl estradiol (OVCON-35) 0.4-35 MG-MCG tablet Vyfemla (28) 0.4 mg-35 mcg tablet   Yes [provider]  promethazine-dextromethorphan (PROMETHAZINE-DM) 6.25-15 MG/5ML syrup Take 5 mLs by mouth 4 (four) times daily as needed for cough. 10/30/20  Yes Tanda Rockers, MD  Bee Pollen 1000 MG TABS Take 1 tablet by mouth daily.      [provider]   clindamycin (CLEOCIN T) 1 % external solution clindamycin phosphate 1 % topical solution 08/14/20   [provider]  fish oil-omega-3 fatty acids 1000 MG capsule Take 1 g by mouth 2 (two) times daily.      [provider]  ibuprofen (ADVIL,MOTRIN) 600 MG tablet 1  po  pc every 6 hours for 5 days then prn-pain 01/10/14   Earnstine Regal, PA-C  loratadine (CLARITIN) 10 MG tablet Take 10 mg by mouth daily as needed for allergies.     [provider]  Misc Natural Products (OSTEO BI-FLEX ADV DOUBLE ST PO) Take 1 tablet by mouth 2 (two) times daily.      [provider]  naproxen (NAPROSYN) 500 MG tablet naproxen 500 mg tablet 02/21/19   [provider]  PROMETHAZINE-DM PO Take by mouth.    [provider]  tretinoin (RETIN-A) 0.025 % cream Apply to back nightly 08/14/20   [provider]  VOLTAREN 1 % GEL Apply 2 g topically 2 (two) times daily as needed (arthritis pain in knee).  11/16/10   [provider]    Current Outpatient Medications  Medication Sig Dispense Refill   Biotin 2.5 MG TABS Take 1 tablet by mouth daily.       Cholecalciferol (VITAMIN D3) 2000  UNITS TABS Take 1 tablet by mouth daily.       Magnesium 250 MG TABS Take 1 tablet by mouth daily.       montelukast (SINGULAIR) 10 MG tablet Take 1 tablet (10 mg total) by mouth at bedtime. 30 tablet 11   norethindrone-ethinyl estradiol (OVCON-35) 0.4-35 MG-MCG tablet Vyfemla (28) 0.4 mg-35 mcg tablet     promethazine-dextromethorphan (PROMETHAZINE-DM) 6.25-15 MG/5ML syrup Take 5 mLs by mouth 4 (four) times daily as needed for cough. 240 mL 1   Bee Pollen 1000 MG TABS Take 1 tablet by mouth daily.       clindamycin (CLEOCIN T) 1 % external solution clindamycin phosphate 1 % topical solution     fish oil-omega-3 fatty acids 1000 MG capsule Take 1 g by mouth 2 (two) times daily.       ibuprofen (ADVIL,MOTRIN) 600 MG tablet 1  po  pc every 6 hours for 5 days then prn-pain 30  tablet 1   loratadine (CLARITIN) 10 MG tablet Take 10 mg by mouth daily as needed for allergies.      Misc Natural Products (OSTEO BI-FLEX ADV DOUBLE ST PO) Take 1 tablet by mouth 2 (two) times daily.       naproxen (NAPROSYN) 500 MG tablet naproxen 500 mg tablet     PROMETHAZINE-DM PO Take by mouth.     tretinoin (RETIN-A) 0.025 % cream Apply to back nightly     VOLTAREN 1 % GEL Apply 2 g topically 2 (two) times daily as needed (arthritis pain in knee).      Current Facility-Administered Medications  Medication Dose Route Frequency Provider Last Rate Last Admin   0.9 %  sodium chloride infusion  500 mL Intravenous Once Daryel November, MD        Allergies as of 12/18/2020 - Review Complete 12/18/2020  Allergen Reaction Noted   Tetracycline Other (See Comments) and Itching 03/03/2007    Family History  Problem Relation Age of Onset   Hypertension Mother    Hyperlipidemia Mother    Arthritis Mother    Diabetes Father    Breast cancer Sister 51   Breast cancer Sister 27   Osteoporosis Other    Colon cancer Neg Hx    Colon polyps Neg Hx    Esophageal cancer Neg Hx    Stomach cancer Neg Hx    Rectal cancer Neg Hx     Social History   Socioeconomic History   Marital status: Single    Spouse name: Not on file   Number of children: Not on file   Years of education: Not on file   Highest education level: Not on file  Occupational History   Occupation: Consultant  Tobacco Use   Smoking status: Never   Smokeless tobacco: Never  Vaping Use   Vaping Use: Never used  Substance and Sexual Activity   Alcohol use: Yes    Alcohol/week: 1.0 standard drink    Types: 1 Glasses of wine per week    Comment: socially   Drug use: No   Sexual activity: Not Currently    Partners: Male    Birth control/protection: Pill  Other Topics Concern   Not on file  Social History Narrative   Exercise---  daily   Social Determinants of Health   Financial Resource Strain: Not on file   Food Insecurity: Not on file  Transportation Needs: Not on file  Physical Activity: Not on file  Stress: Not on file  Social Connections:  Not on file  Intimate Partner Violence: Not on file    Review of Systems: All other review of systems negative except as mentioned in the HPI.  Physical Exam: Vital signs BP 113/71   Pulse 71   Temp 98.2 F (36.8 C)   Ht 5' 1.75" (1.568 m)   Wt 153 lb (69.4 kg)   LMP 11/25/2020 (Approximate)   SpO2 98%   BMI 28.21 kg/m   General:   Alert,  Well-developed, well-nourished, pleasant and cooperative in NAD. MP2 Lungs:  Clear throughout to auscultation.   Heart:  Regular rate and rhythm; no murmurs, clicks, rubs,  or gallops. Abdomen:  Soft, nontender and nondistended. Normal bowel sounds.   Neuro/Psych:   Normal mood and affect. A and O x 3   Azzure Garabedian E. Candis Schatz, MD [facility]

## 2020-12-18 NOTE — Progress Notes (Signed)
Pt's states no medical or surgical changes since previsit or office visit. 

## 2020-12-18 NOTE — Progress Notes (Signed)
A and O x3. Report to RN. Tolerated MAC anesthesia well. 

## 2020-12-18 NOTE — Op Note (Signed)
[facility] Patient Name: Shelley Norman Procedure Date: 12/18/2020 3:09 PM MRN: [id number] Endoscopist: Nicki Reaper E. Candis Schatz , MD Age: 50 Referring MD:  Date of Birth: [birth year] Gender: Female Account #: [id number] Procedure:                Colonoscopy Indications:              Screening for colorectal malignant neoplasm, This                            is the patient's first colonoscopy Medicines:                Monitored Anesthesia Care Procedure:                Pre-Anesthesia Assessment:                           - Prior to the procedure, a History and Physical                            was performed, and patient medications and                            allergies were reviewed. The patient's tolerance of                            previous anesthesia was also reviewed. The risks                            and benefits of the procedure and the sedation                            options and risks were discussed with the patient.                            All questions were answered, and informed consent                            was obtained. Prior Anticoagulants: The patient has                            taken no previous anticoagulant or antiplatelet                            agents. ASA Grade Assessment: II - A patient with                            mild systemic disease. After reviewing the risks                            and benefits, the patient was deemed in                            satisfactory condition to undergo the procedure.  After obtaining informed consent, the colonoscope                            was passed under direct vision. Throughout the                            procedure, the patient's blood pressure, pulse, and                            oxygen saturations were monitored continuously. The                            CF [id number] #[id number] was introduced through the anus                            and advanced to  the the terminal ileum, with                            identification of the appendiceal orifice and IC                            valve. The colonoscopy was somewhat difficult due                            to a tortuous colon. Successful completion of the                            procedure was aided by using manual pressure. The                            patient tolerated the procedure well. The quality                            of the bowel preparation was excellent. The                            terminal ileum, ileocecal valve, appendiceal                            orifice, and rectum were photographed. Scope In: 3:27:12 PM Scope Out: 3:47:16 PM Scope Withdrawal Time: 0 hours 11 minutes 50 seconds  Total Procedure Duration: 0 hours 20 minutes 4 seconds  Findings:                 The perianal and digital rectal examinations were                            normal. Pertinent negatives include normal                            sphincter tone and no palpable rectal lesions.                           A 2 mm polyp was found in the  ascending colon. The                            polyp was sessile. The polyp was removed with a                            jumbo cold forceps. Resection and retrieval were                            complete. Estimated blood loss was minimal.                           The exam was otherwise normal throughout the                            examined colon.                           The terminal ileum appeared normal.                           The retroflexed view of the distal rectum and anal                            verge was normal and showed no anal or rectal                            abnormalities. Complications:            No immediate complications. Estimated Blood Loss:     Estimated blood loss was minimal. Impression:               - One 2 mm polyp in the ascending colon, removed                            with a jumbo cold forceps. Resected and  retrieved.                           - The examined portion of the ileum was normal.                           - The distal rectum and anal verge are normal on                            retroflexion view. Recommendation:           - Patient has a contact number available for                            emergencies. The signs and symptoms of potential                            delayed complications were discussed with the                            patient.  Return to normal activities tomorrow.                            Written discharge instructions were provided to the                            patient.                           - Resume previous diet.                           - Continue present medications.                           - Await pathology results.                           - Repeat colonoscopy in 10 years for surveillance. Lev Cervone E. Candis Schatz, MD 12/18/2020 3:51:10 PM This report has been signed electronically.

## 2020-12-22 ENCOUNTER — Telehealth: Payer: Self-pay

## 2020-12-22 NOTE — Telephone Encounter (Signed)
Attempted to reach pt. With follow-up call following endoscopic procedure 12/18/2020.  LM on pt. Voice mail.  Will try to reach pt. Again later today.

## 2020-12-22 NOTE — Telephone Encounter (Signed)
  Follow up Call-  Call back number 12/18/2020  Post procedure Call Back phone  # [phone]  Permission to leave phone message Yes  Some recent data might be hidden     Patient questions:  Do you have a fever, pain , or abdominal swelling? No. Pain Score  0 *  Have you tolerated food without any problems? Yes.    Have you been able to return to your normal activities? Yes.    Do you have any questions about your discharge instructions: Diet   No. Medications  No. Follow up visit  No.  Do you have questions or concerns about your Care? No.  Actions: * If pain score is 4 or above: No action needed, pain <4.

## 2020-12-27 ENCOUNTER — Encounter: Payer: Self-pay | Admitting: Gastroenterology

## 2021-01-01 NOTE — Telephone Encounter (Signed)
Called patient but she did not answer. Left message for her to call us back.  

## 2021-01-01 NOTE — Telephone Encounter (Signed)
LMTCB   MW please advise if you would be willing to fill out disability forms for this patient. I do not see anything mentioned in the notes regarding the need for disability or if we need to refer her to her PCP. Thanks :)

## 2021-01-01 NOTE — Telephone Encounter (Signed)
Noted.  Will close encounter.  

## 2021-01-01 NOTE — Telephone Encounter (Signed)
Will address at [facility] 10/7

## 2021-01-02 ENCOUNTER — Other Ambulatory Visit: Payer: Self-pay

## 2021-01-02 ENCOUNTER — Encounter: Payer: Self-pay | Admitting: Internal Medicine

## 2021-01-02 ENCOUNTER — Ambulatory Visit: Payer: Managed Care, Other (non HMO) | Admitting: Internal Medicine

## 2021-01-02 ENCOUNTER — Ambulatory Visit (INDEPENDENT_AMBULATORY_CARE_PROVIDER_SITE_OTHER): Payer: Managed Care, Other (non HMO)

## 2021-01-02 DIAGNOSIS — R058 Other specified cough: Secondary | ICD-10-CM

## 2021-01-02 MED ORDER — GABAPENTIN 100 MG PO CAPS: 100.0000 mg | ORAL_CAPSULE | Freq: Three times a day (TID) | ORAL | 2 refills | Status: DC

## 2021-01-02 NOTE — Progress Notes (Signed)
Shelley Norman, female    DOB: 31-Aug-1970   MRN: [id number]   Brief patient profile:  63 yobf never smoker  self referred to pulmonary clinic 10/30/2020  for cough.   Prev eval 02/2012 by Dr [name] for seasonal rhinitis dated back to 2006  rx singulair albuterol continued to have summer rhinitis s assoc cough  and all she needed prn clariton D but around 2016 rhinitis became year round then   July 11th 2022 p exp to moldy conditions from a clogged up A/C line with water in hallways severe had cough similar to 2011 singulair worked  July 16th 2022  [facility]  promethazine and augmentin / singulair,prednisone > helped some   History of Present Illness  10/30/2020  Pulmonary/ 1st office eval/Dilan Fullenwider  Chief Complaint  Patient presents with   Consult    Coughing, 10/07/2020. Started with sinus infection.some sob when coughing and talking long period of time.  Dyspnea:  fine unless coughing  Cough: daytime > night more often,  dry / assoc with pnds despite clariton d Sleep: bed is flat, lots of pillows needed since the cough onset SABA use: none  Covid status   vax x 3   Rec Singulair 10 mg one daily in pm until return For cough >>  Promethazine dm   1 tsp 4 x daily  For drainage / throat tickle try take CHLORPHENIRAMINE  4 mg    Prednisone 10 mg take  4 each am x 2 days,   2 each am x 2 days,  1 each am x 2 days and stop  Try prilosec otc 20mg   Take 30-60 min before first meal of the day and Pepcid ac (famotidine) 20 mg one after supper until cough is completely gone for at least a week without the need for cough suppression GERD diet reviewed, bed blocks rec        01/02/2021  f/u ov/Gaylen Venning re: uacs onset around 2011   maint on chlorpheniramine  / singulair  Chief Complaint  Patient presents with   Follow-up    Cough has improved slightly.   Dyspnea:  Not limited by breathing from desired activities  / mowed grass s sob or cough Cough: dry starts coughing around noon regardless of location  seems to relate to voice / phenergam dm did not stop the cough daytime, may have helped at hs but also taking h2 and 1st gen H1 blockers per guidelines  at hs Sleeping:  much better now  SABA use: none  02: none  Covid status:   vax x 3 never infected    No obvious day to day or daytime variability or assoc excess/ purulent sputum or mucus plugs or hemoptysis or cp or chest tightness, subjective wheeze or overt sinus or hb symptoms.   Sleeping now  without nocturnal  or early am exacerbation  of respiratory  c/o's or need for noct saba. Also denies any obvious fluctuation of symptoms with weather or environmental changes or other aggravating or alleviating factors except as outlined above   No unusual exposure hx or h/o childhood pna/ asthma or knowledge of premature birth.  Current Allergies, Complete Past Medical History, Past Surgical History, Family History, and Social History were reviewed in [employer] record.  ROS  The following are not active complaints unless bolded Hoarseness, sore throat, dysphagia, dental problems, itching, sneezing,  nasal congestion or discharge of excess mucus or purulent secretions, ear ache,   fever, chills,  sweats, unintended wt loss or wt gain, classically pleuritic or exertional cp,  orthopnea pnd or arm/hand swelling  or leg swelling, presyncope, palpitations, abdominal pain, anorexia, nausea, vomiting, diarrhea  or change in bowel habits or change in bladder habits, change in stools or change in urine, dysuria, hematuria,  rash, arthralgias, visual complaints, headache, numbness, weakness or ataxia or problems with walking or coordination,  change in mood or  memory.        Current Meds  Medication Sig   Bee Pollen 1000 MG TABS Take 1 tablet by mouth daily.     Biotin 2.5 MG TABS Take 1 tablet by mouth daily.     Cholecalciferol (VITAMIN D3) 2000 UNITS TABS Take 1 tablet by mouth daily.     clindamycin (CLEOCIN T) 1 % external  solution clindamycin phosphate 1 % topical solution   fish oil-omega-3 fatty acids 1000 MG capsule  Not taking    ibuprofen (ADVIL,MOTRIN) 600 MG tablet 1  po  pc every 6 hours for 5 days then prn-pain   loratadine (CLARITIN) 10 MG tablet On hold     Magnesium 250 MG TABS Take 1 tablet by mouth daily.     Misc Natural Products (OSTEO BI-FLEX ADV DOUBLE ST PO) Take 1 tablet by mouth 2 (two) times daily.     montelukast (SINGULAIR) 10 MG tablet Take 1 tablet (10 mg total) by mouth at bedtime.   naproxen (NAPROSYN) 500 MG tablet naproxen 500 mg tablet   norethindrone-ethinyl estradiol (OVCON-35) 0.4-35 MG-MCG tablet Vyfemla (28) 0.4 mg-35 mcg tablet   promethazine-dextromethorphan (PROMETHAZINE-DM) 6.25-15 MG/5ML syrup Never    PROMETHAZINE-DM PO Take by mouth.   tretinoin (RETIN-A) 0.025 % cream Apply to back nightly   VOLTAREN 1 % GEL Apply 2 g topically 2 (two) times daily as needed (arthritis pain in knee).                Past Medical History:  Diagnosis Date   Allergic rhinitis    Bacterial infection    Fibroids    Mastodynia    Osteoarthritis    feet, knees, lower back - no meds   PONV (postoperative nausea and vomiting)    Vaginismus    Vitamin D deficiency    Yeast infection       Objective:    Wt Readings from Last 3 Encounters:  01/02/21 150 lb (68 kg)  12/18/20 153 lb (69.4 kg)  12/03/20 153 lb (69.4 kg)      Vital signs reviewed  01/02/2021  - Note at rest 02 sats  98% on RA   General appearance:    amb wf coughs with voice use    HEENT : pt wearing mask not removed for exam due to covid -19 concerns.    NECK :  without JVD/Nodes/TM/ nl carotid upstrokes bilaterally   LUNGS: no acc muscle use,  Nl contour chest which is clear to A and P bilaterally without cough on insp or exp maneuvers   CV:  RRR  no s3 or murmur or increase in P2, and no edema   ABD:  soft and nontender with nl inspiratory excursion in the supine position. No bruits or organomegaly  appreciated, bowel sounds nl  MS:  Nl gait/ ext warm without deformities, calf tenderness, cyanosis or clubbing - No obvious joint restrictions   SKIN: warm and dry without lesions    NEURO:  alert, approp, nl sensorium with  no motor or cerebellar deficits apparent.  CXR PA and Lateral:   01/02/2021 :    I personally reviewed images / impression as follows:    No cardiopulmonary dz       Assessment

## 2021-01-02 NOTE — Patient Instructions (Addendum)
Gabapentin 100 mg three times a day should gradually eliminate the cough  Clariton d each am   Try prilosec otc 20mg   x  Take 30-60 min before first meal of the day and Pepcid ac (famotidine) 20 mg one @  bedtime until cough is completely gone for at least a week without the need for cough suppression     GERD (REFLUX)  is an extremely common cause of respiratory symptoms just like yours , many times with no obvious heartburn at all.    It can be treated with medication, but also with lifestyle changes including elevation of the head of your bed (ideally with 6 -8inch blocks under the headboard of your bed),  Smoking cessation, avoidance of late meals, excessive alcohol, and avoid fatty foods, chocolate, peppermint, colas, red wine, and acidic juices such as orange juice.  NO MINT OR MENTHOL PRODUCTS SO NO COUGH DROPS  USE SUGARLESS CANDY INSTEAD (Jolley ranchers or Stover's or Life Savers) or even ice chips will also do - the key is to swallow to prevent all throat clearing. NO OIL BASED VITAMINS - use powdered substitutes.  Avoid fish oil when coughing.   Please schedule a follow up office visit in 4 weeks, sooner if needed

## 2021-01-03 ENCOUNTER — Encounter: Payer: Self-pay | Admitting: Internal Medicine

## 2021-01-03 NOTE — Assessment & Plan Note (Addendum)
Recurrent since around 2011 esp since July 2022  - flared 1st part of July 2022 p ? Exp to moldy flooring p a/c leak - Allergy profile 10/30/2020 >  Eos 0.1 /  IgE  112 - gabapentin 100 mg tid 01/02/2021 >>>   Upper airway cough syndrome (previously labeled PNDS),  is so named because it's frequently impossible to sort out how much is  CR/sinusitis with freq throat clearing (which can be related to primary GERD)   vs  causing  secondary (" extra esophageal")  GERD from wide swings in gastric pressure that occur with throat clearing, often  promoting self use of mint and menthol lozenges that reduce the lower esophageal sphincter tone and exacerbate the problem further in a cyclical fashion.   These are the same pts (now being labeled as having "irritable larynx syndrome" by some cough centers) who not infrequently have a history of having failed to tolerate ace inhibitors,  dry powder inhalers or biphosphonates or report having atypical/extraesophageal reflux symptoms that don't respond to standard doses of PPI  and are easily confused as having aecopd or asthma flares by even experienced allergists/ pulmonologists (myself included).   rec either refer to [facility] / Dr Joya Gaskins or trial of gabapentin titrating up to as high as 300 mg tid if tolerates and if can't find an effective dose that's tolerated then refer.  Discussed in detail all the  indications, usual  risks and alternatives  relative to the benefits with patient who agrees to proceed with Rx with gabapentin trial while on max rx for gerd and rhinitis see avs for instructions unique to this ov           Each maintenance medication was reviewed in detail including emphasizing most importantly the difference between maintenance and prns and under what circumstances the prns are to be triggered using an action plan format where appropriate.  Total time for H and P, chart review, counseling,  and generating customized AVS unique to this office  visit / same day charting  > 30 min

## 2021-01-05 ENCOUNTER — Ambulatory Visit
Admission: RE | Admit: 2021-01-05 | Discharge: 2021-01-05 | Disposition: A | Discharge status: Home / Self Care | Payer: Managed Care, Other (non HMO) | Source: Ambulatory Visit | Attending: Obstetrics and Gynecology | Admitting: Obstetrics and Gynecology

## 2021-01-05 ENCOUNTER — Other Ambulatory Visit: Payer: Self-pay

## 2021-01-05 DIAGNOSIS — Z1231 Encounter for screening mammogram for malignant neoplasm of breast: Secondary | ICD-10-CM

## 2021-01-05 LAB — RESULTS CONSOLE HPV: CHL HPV: NEGATIVE

## 2021-01-05 LAB — HM PAP SMEAR: HM Pap smear: NEGATIVE

## 2021-02-09 ENCOUNTER — Ambulatory Visit: Payer: Managed Care, Other (non HMO) | Admitting: Internal Medicine

## 2021-02-09 ENCOUNTER — Other Ambulatory Visit: Payer: Self-pay

## 2021-02-09 ENCOUNTER — Encounter: Payer: Self-pay | Admitting: Internal Medicine

## 2021-02-09 DIAGNOSIS — R058 Other specified cough: Secondary | ICD-10-CM | POA: Diagnosis not present

## 2021-02-09 MED ORDER — PROMETHAZINE-DM 6.25-15 MG/5ML PO SYRP
5.0000 mL | ORAL_SOLUTION | Freq: Four times a day (QID) | ORAL | 0 refills | Status: DC | PRN
Start: 2021-02-09 — End: 2021-11-12

## 2021-02-09 MED ORDER — PREDNISONE 10 MG PO TABS: ORAL_TABLET | ORAL | 0 refills | Status: DC

## 2021-02-09 NOTE — Progress Notes (Signed)
Shelley Norman, female    DOB: 11-18-1970   MRN: [id number]   Brief patient profile:  38 yobf never smoker  self referred to pulmonary clinic 10/30/2020  for cough.   Prev eval 02/2012 by Dr [name] for seasonal rhinitis dated back to 2006  rx singulair albuterol continued to have summer rhinitis s assoc cough  and all she needed prn clariton D but around 2016 rhinitis became year round then   July 11th 2022 p exp to moldy conditions from a clogged up A/C line with water in hallways severe had cough similar to 2011 singulair worked  [place] 2012 rx by [name] was effective x years, ? relates to using tussionex?   July 16th 2022  [facility]  promethazine and augmentin / singulair,prednisone     History of Present Illness  10/30/2020  Pulmonary/ 1st office eval/[name]  Chief Complaint  Patient presents with   Consult    Coughing, 10/07/2020. Started with sinus infection.some sob when coughing and talking long period of time.  Dyspnea:  fine unless coughing  Cough: daytime > night more often,  dry / assoc with pnds despite clariton d Sleep: bed is flat, lots of pillows needed since the cough onset SABA use: none  Covid status   vax x 3   Rec Singulair 10 mg one daily in pm until return For cough >>  Promethazine dm   1 tsp 4 x daily  For drainage / throat tickle try take CHLORPHENIRAMINE  4 mg    Prednisone 10 mg take  4 each am x 2 days,   2 each am x 2 days,  1 each am x 2 days and stop  Try prilosec otc 20mg   Take 30-60 min before first meal of the day and Pepcid ac (famotidine) 20 mg one after supper until cough is completely gone for at least a week without the need for cough suppression GERD diet reviewed, bed blocks rec        01/02/2021  f/u ov/[name] re: uacs onset around 2011   maint on chlorpheniramine  / singulair  Chief Complaint  Patient presents with   Follow-up    Cough has improved slightly.   Dyspnea:  Not limited by breathing from desired activities  / mowed grass s sob or  cough Cough: dry starts coughing around noon regardless of location seems to relate to voice / phenergam dm did not stop the cough daytime, may have helped at hs but also taking h2 and 1st gen H1 blockers per guidelines  at hs Sleeping:  much better now  SABA use: none  02: none  Covid status:   vax x 3 never infected  Rec Gabapentin 100 mg three times a day should gradually eliminate the cough Clariton d each am  Try prilosec otc 20mg   x  Take 30-60 min before first meal of the day and Pepcid ac (famotidine) 20 mg one @  bedtime until cough is completely gone for at least a week without the need for cough suppression GERD diet/ lifestyle  Please schedule a follow up office visit in 4 weeks, sooner if needed     02/09/2021  f/u ov/[name] re: cough x July 2022   maint on singualir, acid suppression and gabapentin 100 mg hs  / h1 hs only  Chief Complaint  Patient presents with   Follow-up    4wk f/u, wants to discuss FMLA.    Dyspnea:  no sob  Cough: dry cough  worse with voice, mid afternoon, less on off days  Sleeping: propped up on pillows better p asleep and w/in 2 h of rising back coughing again SABA use: none  02: none  Covid status:   vax x 3 never covid infection    No obvious day to day or daytime variability or assoc excess/ purulent sputum or mucus plugs or hemoptysis or cp or chest tightness, subjective wheeze or overt sinus or hb symptoms.     Also denies any obvious fluctuation of symptoms with weather or environmental changes or other aggravating or alleviating factors except as outlined above   No unusual exposure hx or h/o childhood pna/ asthma or knowledge of premature birth.  Current Allergies, Complete Past Medical History, Past Surgical History, Family History, and Social History were reviewed in [employer] record.  ROS  The following are not active complaints unless bolded Hoarseness, sore throat, dysphagia, dental problems, itching,  sneezing,  nasal congestion or discharge of excess mucus or purulent secretions, ear ache,   fever, chills, sweats, unintended wt loss or wt gain, classically pleuritic or exertional cp,  orthopnea pnd or arm/hand swelling  or leg swelling, presyncope, palpitations, abdominal pain, anorexia, nausea, vomiting, diarrhea  or change in bowel habits or change in bladder habits, change in stools or change in urine, dysuria, hematuria,  rash, arthralgias, visual complaints, headache, numbness, weakness or ataxia or problems with walking or coordination,  change in mood or  memory.        Current Meds  Medication Sig   Bee Pollen 1000 MG TABS Take 1 tablet by mouth daily.     Biotin 2.5 MG TABS Take 1 tablet by mouth daily.     Cholecalciferol (VITAMIN D3) 2000 UNITS TABS Take 1 tablet by mouth daily.     clindamycin (CLEOCIN T) 1 % external solution clindamycin phosphate 1 % topical solution   gabapentin (NEURONTIN) 100 MG capsule Take 1 capsule (100 mg total) by mouth 3 (three) times daily. One three times daily   ibuprofen (ADVIL,MOTRIN) 600 MG tablet 1  po  pc every 6 hours for 5 days then prn-pain   loratadine (CLARITIN) 10 MG tablet Take 10 mg by mouth daily as needed for allergies.    Magnesium 250 MG TABS Take 1 tablet by mouth daily.     Misc Natural Products (OSTEO BI-FLEX ADV DOUBLE ST PO) Take 1 tablet by mouth 2 (two) times daily.     montelukast (SINGULAIR) 10 MG tablet Take 1 tablet (10 mg total) by mouth at bedtime.   naproxen (NAPROSYN) 500 MG tablet naproxen 500 mg tablet   norethindrone-ethinyl estradiol (OVCON-35) 0.4-35 MG-MCG tablet Vyfemla (28) 0.4 mg-35 mcg tablet   tretinoin (RETIN-A) 0.025 % cream Apply to back nightly   VOLTAREN 1 % GEL Apply 2 g topically 2 (two) times daily as needed (arthritis pain in knee).            Past Medical History:  Diagnosis Date   Allergic rhinitis    Bacterial infection    Fibroids    Mastodynia    Osteoarthritis    feet, knees, lower  back - no meds   PONV (postoperative nausea and vomiting)    Vaginismus    Vitamin D deficiency    Yeast infection       Objective:    02/09/2021      153   01/02/21 150 lb (68 kg)  12/18/20 153 lb (69.4 kg)  12/03/20 153 lb (  69.4 kg)    Vital signs reviewed  02/09/2021  - Note at rest 02 sats  100% on RA   General appearance:    amb bf freq dry coughing    HEENT : pt wearing mask not removed for exam due to covid -19 concerns.    NECK :  without JVD/Nodes/TM/ nl carotid upstrokes bilaterally   LUNGS: no acc muscle use,  Nl contour chest which is clear to A and P bilaterally without cough on insp or exp maneuvers   CV:  RRR  no s3 or murmur or increase in P2, and no edema   ABD:  soft and nontender with nl inspiratory excursion in the supine position. No bruits or organomegaly appreciated, bowel sounds nl  MS:  Nl gait/ ext warm without deformities, calf tenderness, cyanosis or clubbing No obvious joint restrictions   SKIN: warm and dry without lesions    NEURO:  alert, approp, nl sensorium with  no motor or cerebellar deficits apparent.          Assessment

## 2021-02-09 NOTE — Patient Instructions (Addendum)
Gabapentin 100 mg build up the evening dose as needed   Continue singulair, acid suppression as you are.    When you can take prednisone x 3 days to eliminate the the cough:  Then start Promethazine dm up to every 4 hours to totally eliminate the cough and chlorpheniramine to eliminate drainage issues    Your condition is likely to continue 6-9 months from onset which was in July   Please schedule a follow up office visit in 4 weeks, sooner if needed

## 2021-02-10 ENCOUNTER — Encounter: Payer: Self-pay | Admitting: Internal Medicine

## 2021-02-10 NOTE — Assessment & Plan Note (Signed)
Recurrent once x  2011 esp since July 2022  - flared 1st part of July 2022 p ? Exp to moldy flooring p a/c leak>  Professionally cleared  - Allergy profile 10/30/2020 >  Eos 0.1 /  IgE  112 - gabapentin 100 mg tid 01/02/2021 >>> not able to take daytime  - 02/09/2021 rec gradually increase pm dose of gabapentin until cough eliminated   Of the three most common causes of  Sub-acute / recurrent or chronic cough, only one (GERD)  can actually contribute to/ trigger  the other two (asthma and post nasal drip syndrome)  and perpetuate the cylce of cough.  While not intuitively obvious, many patients with chronic low grade reflux do not cough until there is a primary insult that disturbs the protective epithelial barrier and exposes sensitive nerve endings.   This is typically viral but can due to PNDS and  either latter may apply here.    >>> The point is that once this occurs, it is difficult to eliminate the cycle  using anything but a maximally effective acid suppression regimen at least in the short run, accompanied by an appropriate diet to address non acid GERD and control / eliminate the cough itself with gabapentin titrated in pm as high as needed /tolerated and 1st gen H1 blockers per guidelines  >>> also so added 6 day taper off  Prednisone starting at 40 mg per day in case of component of Th-2 driven upper or lower airways inflammation (if cough responds short term only to relapse before return while will on full rx for uacs (as above), then  that would point to allergic rhinitis/ asthma or eos bronchitis as alternative dx)   Unable to predict when cough will go back into remission so given letter to expect 6-9 months of possible work absences for doctor's appts.         Each maintenance medication was reviewed in detail including emphasizing most importantly the difference between maintenance and prns and under what circumstances the prns are to be triggered using an action plan format where  appropriate.  Total time for H and P, chart review, counseling, reviewing  and generating customized AVS unique to this office visit / same day charting  > 30 min

## 2021-02-13 ENCOUNTER — Telehealth: Payer: Self-pay | Admitting: Internal Medicine

## 2021-02-13 NOTE — Telephone Encounter (Signed)
Spoke to patient extensively and updated ADA form to provide details regarding frequency of coughing flare ups and needed medical appointments.  Form was signed by Rexene Edison, NP and then faxed to [facility], Attn:  Charlean Sanfilippo fax# [phone].  Emailed copy to patient.

## 2021-02-13 NOTE — Telephone Encounter (Signed)
If there is anything we can do to help or accommodate please let us know.  Thanks

## 2021-02-13 NOTE — Telephone Encounter (Signed)
Pt calling to speak to Cash. Needing ADA accommodation for-sent Darilyn email on Monday followed up Tuesday and today with no response. Needs to get this to employer asap- Also routed to triage in case someone besides Darilyn can fill this out. Pt needs asap.Please advise [phone]

## 2021-04-09 ENCOUNTER — Other Ambulatory Visit: Payer: Self-pay

## 2021-04-09 ENCOUNTER — Ambulatory Visit (INDEPENDENT_AMBULATORY_CARE_PROVIDER_SITE_OTHER): Payer: Managed Care, Other (non HMO)

## 2021-04-09 DIAGNOSIS — Z23 Encounter for immunization: Secondary | ICD-10-CM

## 2021-04-09 NOTE — Progress Notes (Signed)
After obtaining consent, and per orders of Wilfred Lacy, NP, 2nd dose of shingles vaccine given IM by Marchia Bond, CMA in the right deltoid. Patient tolerated inj well. Patient instructed to report any adverse reaction to me immediately.

## 2021-04-16 ENCOUNTER — Ambulatory Visit: Payer: Managed Care, Other (non HMO) | Admitting: Internal Medicine

## 2021-05-05 ENCOUNTER — Encounter: Payer: Self-pay | Admitting: Internal Medicine

## 2021-05-05 ENCOUNTER — Other Ambulatory Visit: Payer: Self-pay

## 2021-05-05 ENCOUNTER — Ambulatory Visit: Payer: Managed Care, Other (non HMO) | Admitting: Internal Medicine

## 2021-05-05 DIAGNOSIS — R058 Other specified cough: Secondary | ICD-10-CM

## 2021-05-05 MED ORDER — PREDNISONE 10 MG PO TABS: ORAL_TABLET | ORAL | 0 refills | Status: DC

## 2021-05-05 NOTE — Patient Instructions (Signed)
Singulair 10 mg each pm  along with chlorpheniramine 4 mg 1 or 2   Prednisone 10 mg take  4 each am x 2 days,   2 each am x 2 days,  1 each am x 2 days and stop   Continue clariton 10 mg as need itchy/sneezy runny nose    Try prilosec otc 20mg   Take 30-60 min before first meal of the day and Pepcid ac (famotidine) 20 mg one @  bedtime until cough is completely gone for at least a week   Call if not satisfied and I will refer you to an allergist

## 2021-05-05 NOTE — Assessment & Plan Note (Signed)
Recurrent on episode x  2011  Then recurred  since July 2022  - flared 1st part of July 2022 p ? Exp to moldy flooring p a/c leak>  Professionally cleared  - Allergy profile 10/30/2020 >  Eos 0.1 /  IgE  112 - gabapentin 100 mg tid 01/02/2021 >>> not able to take daytime  - 02/09/2021 rec gradually increase pm dose of gabapentin until cough eliminated> could not tol cns effects - 05/05/2021 restrart nightly singulair and 1st gen H1 blockers per guidelines/ pred x 6 d/   otc gerd rx until no cough x one week and if not better allergy eval next    The most common causes of chronic cough in immunocompetent adults include the following: upper airway cough syndrome (UACS), previously referred to as postnasal drip syndrome (PNDS), which is caused by variety of rhinosinus conditions; (2) asthma; (3) GERD; (4) chronic bronchitis from cigarette smoking or other inhaled environmental irritants; (5) nonasthmatic eosinophilic bronchitis; and (6) bronchiectasis.   These conditions, singly or in combination, have accounted for up to 94% of the causes of chronic cough in prospective studies.   Other conditions have constituted no >6% of the causes in prospective studies These have included bronchogenic carcinoma, chronic interstitial pneumonia, sarcoidosis, left ventricular failure, ACEI-induced cough, and aspiration from a condition associated with pharyngeal dysfunction.    Chronic cough is often simultaneously caused by more than one condition. A single cause has been found from 38 to 82% of the time, multiple causes from 18 to 62%. Multiply caused cough has been the result of three diseases up to 42% of the time.       The standardized cough guidelines published in Chest by Lissa Morales in 2006 are still the best available and consist of a multiple step process (up to 12!) , not a single office visit,  and are intended  to address this problem logically,  with an alogrithm dependent on response to empirical   treatment at  each progressive step  to determine a specific diagnosis with  minimal addtional testing needed. Therefore if adherence is an issue or can't be accurately verified,  it's very unlikely the standard evaluation and treatment will be successful here.    Furthermore, response to therapy (other than acute cough suppression, which should only be used short term with avoidance of narcotic containing cough syrups if possible), can be a gradual process for which the patient is not likely to  perceive immediate benefit.  Unlike going to an eye doctor where the best perscription is almost always the first one and is immediately effective, this is almost never the case in the management of chronic cough syndromes. Therefore the patient needs to commit up front to consistently adhere to recommendations  for up to 6 weeks of therapy directed at the likely underlying problem(s) before the response can be reasonably evaluated.   If improves then needs to maintain on singulair and 1st gen H1 blockers per guidelines  As her only maint rx and not better next step is allergy referral.           Each maintenance medication was reviewed in detail including emphasizing most importantly the difference between maintenance and prns and under what circumstances the prns are to be triggered using an action plan format where appropriate.  Total time for H and P, chart review, counseling,  and generating customized AVS unique to this office visit / same day charting  > 30 min

## 2021-05-05 NOTE — Progress Notes (Signed)
Shelley Norman, female    DOB: December 26, 1970   MRN: [id number]   Brief patient profile:  57 yobf never smoker  self referred to pulmonary clinic 10/30/2020  for cough.   Prev eval 02/2012 by Dr [name] for seasonal rhinitis dated back to 2006  rx singulair albuterol continued to have summer rhinitis s assoc cough  and all she needed prn clariton D but around 2016 rhinitis became year round then   July 11th 2022 p exp to moldy conditions from a clogged up A/C line with water in hallways severe had cough similar to 2011 singulair worked  [place] 2012 rx by [name] was effective x years, ? relates to using tussionex?   July 16th 2022  [facility] at [facility]  promethazine and augmentin / singulair,prednisone     History of Present Illness  10/30/2020  Pulmonary/ 1st office eval/Shelley Norman  Chief Complaint  Patient presents with   Consult    Coughing, 10/07/2020. Started with sinus infection.some sob when coughing and talking long period of time.  Dyspnea:  fine unless coughing  Cough: daytime > night more often,  dry / assoc with pnds despite clariton d Sleep: bed is flat, lots of pillows needed since the cough onset SABA use: none  Covid status   vax x 3   Rec Singulair 10 mg one daily in pm until return For cough >>  Promethazine dm   1 tsp 4 x daily  For drainage / throat tickle try take CHLORPHENIRAMINE  4 mg    Prednisone 10 mg take  4 each am x 2 days,   2 each am x 2 days,  1 each am x 2 days and stop  Try prilosec otc 20mg   Take 30-60 min before first meal of the day and Pepcid ac (famotidine) 20 mg one after supper until cough is completely gone for at least a week without the need for cough suppression GERD diet reviewed, bed blocks rec        01/02/2021  f/u ov/Shelley Norman re:   maint on chlorpheniramine  / singulair  Chief Complaint  Patient presents with   Follow-up    Cough has improved slightly.   Dyspnea:  Not limited by breathing from desired activities  / mowed grass s sob or cough Cough: dry starts  coughing around noon regardless of location seems to relate to voice / phenergam dm did not stop the cough daytime, may have helped at hs but also taking h2 and 1st gen H1 blockers per guidelines  at hs Sleeping:  much better now  SABA use: none  02: none  Covid status:   vax x 3 never infected  Rec Gabapentin 100 mg three times a day should gradually eliminate the cough Clariton d each am  Try prilosec otc 20mg   x  Take 30-60 min before first meal of the day and Pepcid ac (famotidine) 20 mg one @  bedtime until cough is completely gone for at least a week without the need for cough suppression GERD diet/ lifestyle  Please schedule a follow up office visit in 4 weeks, sooner if needed     02/09/2021  f/u ov/Shelley Norman re: uacs flared  July 2022   maint on singualir, acid suppression and gabapentin 100 mg hs  / h1 hs only  Chief Complaint  Patient presents with   Follow-up    4wk f/u, wants to discuss FMLA.    Dyspnea:  no sob  Cough: dry cough worse with voice,  mid afternoon, less on off days  Sleeping: propped up on pillows better p asleep and w/in 2 h of rising back coughing again SABA use: none  02: none  Covid status:   vax x 3 never covid infection   Rec Continue singulair, acid suppression as you are.  When you can take prednisone x 3 days to eliminate the the cough: Then start Promethazine dm up to every 4 hours to totally eliminate the cough and chlorpheniramine to eliminate drainage issues   Please schedule a follow up office visit in 4 weeks, sooner if needed     05/05/2021  f/u ov/Shelley Norman re:  uacs flared July 2022  maint on singulair but not consistent  with it, thinks it worked the best in combination with prednisone and note same thing happened in 2012 with similar presentation and fine for 10 y in between w/o need for cough meds  Chief Complaint  Patient presents with   Follow-up    Cough has improved some since the last visit. She still has cough when she talks a lot. Her  cough is non prod.    Dyspnea:  very active and not limited including push mower  Cough: worst 12-2pm each afternoon and now  rare at hs if takes singulair/ 1st gen H1 blockers per guidelines /  worse at work when uses voice Sleeping: no longer disturbing  SABA use: none  02: none  Covid status:  just had bivalent    No obvious day to day or daytime variability or assoc excess/ purulent sputum or mucus plugs or hemoptysis or cp or chest tightness, subjective wheeze or overt sinus or hb symptoms.   Sleeping most nights now  without nocturnal  or early am exacerbation  of respiratory  c/o's or need for noct saba. Also denies any obvious fluctuation of symptoms with weather or environmental changes or other aggravating or alleviating factors except as outlined above   No unusual exposure hx or h/o childhood pna/ asthma or knowledge of premature birth.  Current Allergies, Complete Past Medical History, Past Surgical History, Family History, and Social History were reviewed in [employer] record.  ROS  The following are not active complaints unless bolded Hoarseness, sore throat, dysphagia, dental problems, itching, sneezing,  nasal congestion or discharge of excess mucus or purulent secretions, ear ache,   fever, chills, sweats, unintended wt loss or wt gain, classically pleuritic or exertional cp,  orthopnea pnd or arm/hand swelling  or leg swelling, presyncope, palpitations, abdominal pain, anorexia, nausea, vomiting, diarrhea  or change in bowel habits or change in bladder habits, change in stools or change in urine, dysuria, hematuria,  rash, arthralgias, visual complaints, headache, numbness, weakness or ataxia or problems with walking or coordination,  change in mood or  memory.        Current Meds  Medication Sig   Bee Pollen 1000 MG TABS Take 1 tablet by mouth daily.     Biotin 2.5 MG TABS Take 1 tablet by mouth daily.     Cholecalciferol (VITAMIN D3) 2000 UNITS  TABS Take 1 tablet by mouth daily.     clindamycin (CLEOCIN T) 1 % external solution clindamycin phosphate 1 % topical solution   gabapentin (NEURONTIN) 100 MG capsule Take 1 capsule (100 mg total) by mouth 3 (three) times daily. One three times daily   ibuprofen (ADVIL,MOTRIN) 600 MG tablet 1  po  pc every 6 hours for 5 days then prn-pain   loratadine (CLARITIN) 10  MG tablet Take 10 mg by mouth daily as needed for allergies.    Magnesium 250 MG TABS Take 1 tablet by mouth daily.     Misc Natural Products (OSTEO BI-FLEX ADV DOUBLE ST PO) Take 1 tablet by mouth 2 (two) times daily.     montelukast (SINGULAIR) 10 MG tablet Take 1 tablet (10 mg total) by mouth at bedtime.   naproxen (NAPROSYN) 500 MG tablet naproxen 500 mg tablet   norethindrone-ethinyl estradiol (OVCON-35) 0.4-35 MG-MCG tablet Vyfemla (28) 0.4 mg-35 mcg tablet   promethazine-dextromethorphan (PROMETHAZINE-DM) 6.25-15 MG/5ML syrup Take 5 mLs by mouth 4 (four) times daily as needed for cough.   tretinoin (RETIN-A) 0.025 % cream Apply to back nightly   VOLTAREN 1 % GEL Apply 2 g topically 2 (two) times daily as needed (arthritis pain in knee).            Past Medical History:  Diagnosis Date   Allergic rhinitis    Bacterial infection    Fibroids    Mastodynia    Osteoarthritis    feet, knees, lower back - no meds   PONV (postoperative nausea and vomiting)    Vaginismus    Vitamin D deficiency    Yeast infection       Objective:     Wts    05/05/2021          157   02/09/2021      153   01/02/21 150 lb (68 kg)  12/18/20 153 lb (69.4 kg)  12/03/20 153 lb (69.4 kg)    Vital signs reviewed  05/05/2021  - Note at rest 02 sats  98% on RA   General appearance:    amb wf with slt raspy voice    HEENT : pt wearing mask not removed for exam due to covid -19 concerns.  R ear full of wax    NECK :  without JVD/Nodes/TM/ nl carotid upstrokes bilaterally   LUNGS: no acc muscle use,  Nl contour chest which is clear to  A and P bilaterally without cough on insp or exp maneuvers   CV:  RRR  no s3 or murmur or increase in P2, and no edema   ABD:  soft and nontender with nl inspiratory excursion in the supine position. No bruits or organomegaly appreciated, bowel sounds nl  MS:  Nl gait/ ext warm without deformities, calf tenderness, cyanosis or clubbing No obvious joint restrictions   SKIN: warm and dry without lesions    NEURO:  alert, approp, nl sensorium with  no motor or cerebellar deficits apparent.           Assessment

## 2021-05-18 ENCOUNTER — Telehealth: Payer: Self-pay | Admitting: Internal Medicine

## 2021-05-18 NOTE — Telephone Encounter (Signed)
rec'd email from patient asking to have ADA work restrictions extended to 12-18 months. She had office visit with Dr. Melvyn Novas on 2/7.  I completed form and sent to T. Parrett for signature because Dr. Melvyn Novas will not be in [place] office this week.  Signed form and 2/7 ov notes are to be emailed to patient at A T [email or web address]

## 2021-05-25 NOTE — Telephone Encounter (Signed)
Dr. Melvyn Novas signed the ADA Physician Statement extending duration of work restrictions to "indefinite - 12-18 months minimum".  I spoke to the patient and then emailed the form to her.

## 2021-06-05 ENCOUNTER — Telehealth: Payer: Self-pay | Admitting: Internal Medicine

## 2021-06-05 NOTE — Telephone Encounter (Signed)
Rec'd an email from patient to me on 3/7 with new FMLA/ADA form for [employer] with [employer].  Using info in her email, I have completed the form and emailed it to [facility] for review or accuracy before sending it to Dr. Melvyn Novas for signature.  Sent a reply email to the patient with status.  ?

## 2021-06-11 ENCOUNTER — Telehealth: Payer: Self-pay | Admitting: Internal Medicine

## 2021-06-11 DIAGNOSIS — R058 Other specified cough: Secondary | ICD-10-CM

## 2021-06-11 NOTE — Telephone Encounter (Signed)
ATC patient, LMTCB ? ?Patient would like referral to Allergy Doctor. Please advise ?

## 2021-06-12 NOTE — Telephone Encounter (Signed)
[employer]  ?

## 2021-06-12 NOTE — Telephone Encounter (Signed)
Patient is returning phone call. Patient phone number is [phone]. May leave detailed message on voicemail for referral information. ?

## 2021-06-12 NOTE — Telephone Encounter (Signed)
Called patient but she did not answer. Left detailed message including the office number for Dr. Bruna Potter office.  ? ?Referral has been placed.  ? ?Nothing further needed at time of call.  ?

## 2021-06-18 ENCOUNTER — Telehealth: Payer: Self-pay | Admitting: Internal Medicine

## 2021-06-18 NOTE — Telephone Encounter (Signed)
Spoke with the pt  ?She is asking if she can switch providers back to Dr [name]  ?She has seen him before, back in 2013 and then became Dr Gustavus Bryant pt  ?She states that she is not interested in going to the [facility] office once it opens  ?Dr Melvyn Novas, please advise, thanks! ?

## 2021-06-19 NOTE — Telephone Encounter (Signed)
Tanda Rockers, MD ?to Me   ?   8:01 PM ?Fine with me  ? ?Dr [name]- are you okay to take this pt back? You have seen her in the past. Thanks! ?

## 2021-06-19 NOTE — Telephone Encounter (Signed)
Spoke with the pt and made appt with Dr [name] for 07/13/21. Nothing further needed.  ?

## 2021-06-19 NOTE — Telephone Encounter (Signed)
Ok with me 

## 2021-06-22 NOTE — Telephone Encounter (Signed)
Updated ADA form for [employer] was signed by Dr. Melvyn Novas today and emailed to patient.  I did not fax it to insurance because patient indicated she want me to send it directly to her. ?

## 2021-06-23 ENCOUNTER — Ambulatory Visit: Payer: Managed Care, Other (non HMO) | Admitting: Internal Medicine

## 2021-06-24 ENCOUNTER — Ambulatory Visit (INDEPENDENT_AMBULATORY_CARE_PROVIDER_SITE_OTHER): Payer: Managed Care, Other (non HMO) | Admitting: Allergy

## 2021-06-24 ENCOUNTER — Encounter: Payer: Self-pay | Admitting: Allergy

## 2021-06-24 VITALS — BP 122/76 | HR 98 | Temp 97.6°F | Resp 16 | Ht 61.5 in | Wt 155.8 lb

## 2021-06-24 DIAGNOSIS — R053 Chronic cough: Secondary | ICD-10-CM | POA: Diagnosis not present

## 2021-06-24 DIAGNOSIS — J3089 Other allergic rhinitis: Secondary | ICD-10-CM | POA: Insufficient documentation

## 2021-06-24 DIAGNOSIS — H1013 Acute atopic conjunctivitis, bilateral: Secondary | ICD-10-CM | POA: Insufficient documentation

## 2021-06-24 MED ORDER — ALBUTEROL SULFATE HFA 108 (90 BASE) MCG/ACT IN AERS: 2.0000 | INHALATION_SPRAY | RESPIRATORY_TRACT | 1 refills | Status: DC | PRN

## 2021-06-24 MED ORDER — RYALTRIS 665-25 MCG/ACT NA SUSP: 1.0000 | Freq: Two times a day (BID) | NASAL | 5 refills | Status: DC

## 2021-06-24 MED ORDER — FLUTICASONE FUROATE-VILANTEROL 100-25 MCG/ACT IN AEPB: 1.0000 | INHALATION_SPRAY | Freq: Every day | RESPIRATORY_TRACT | 3 refills | Status: DC

## 2021-06-24 NOTE — Patient Instructions (Addendum)
Today's skin testing showed: ?Positive to grass, trees, ragweed, weed, mold, cat and dust mites. ?Negative to common foods.  ? ?Results given. ? ?Coughing ?Daily controller medication(s): Start Breo 171mg 1 puff once a day and rinse mouth after each use. Demonstrated proper use.  ?May use albuterol rescue inhaler 2 puffs every 4 to 6 hours as needed for shortness of breath, chest tightness, coughing, and wheezing. May use albuterol rescue inhaler 2 puffs 5 to 15 minutes prior to strenuous physical activities. Monitor frequency of use.  ?Coughing control goals:  ?Full participation in all desired activities (may need albuterol before activity) ?Albuterol use two times or less a week on average (not counting use with activity) ?Cough interfering with sleep two times or less a month ?Oral steroids no more than once a year ?No hospitalizations  ? ?Environmental allergies ?Start environmental control measures as below. ?Use over the counter antihistamines such as Zyrtec (cetirizine), Claritin (loratadine), Allegra (fexofenadine), or Xyzal (levocetirizine) daily as needed. May take twice a day during allergy flares. May switch antihistamines every few months. ?Continue Singulair (montelukast) '10mg'$  daily at night. ?Start Ryaltris (olopatadine + mometasone nasal spray combination) 1-2 sprays per nostril twice a day. Sample given. ?This will be mailed to you. ? ?Stop Prilosec. ? ?Follow up in 2 months or sooner if needed.   ? ?Reducing Pollen Exposure ?Pollen seasons: trees (spring), grass (summer) and ragweed/weeds (fall). ?Keep windows closed in your home and car to lower pollen exposure.  ?Install air conditioning in the bedroom and throughout the house if possible.  ?Avoid going out in dry windy days - especially early morning. ?Pollen counts are highest between 5 - 10 AM and on dry, hot and windy days.  ?Save outside activities for late afternoon or after a heavy rain, when pollen levels are lower.  ?Avoid mowing of  grass if you have grass pollen allergy. ?Be aware that pollen can also be transported indoors on people and pets.  ?Dry your clothes in an automatic dryer rather than hanging them outside where they might collect pollen.  ?Rinse hair and eyes before bedtime. ?Mold Control ?Mold and fungi can grow on a variety of surfaces provided certain temperature and moisture conditions exist.  ?Outdoor molds grow on plants, decaying vegetation and soil. The major outdoor mold, Alternaria and Cladosporium, are found in very high numbers during hot and dry conditions. Generally, a late summer - fall peak is seen for common outdoor fungal spores. Rain will temporarily lower outdoor mold spore count, but counts rise rapidly when the rainy period ends. ?The most important indoor molds are Aspergillus and Penicillium. Dark, humid and poorly ventilated basements are ideal sites for mold growth. The next most common sites of mold growth are the bathroom and the kitchen. ?Outdoor (Seasonal) Mold Control ?Use air conditioning and keep windows closed. ?Avoid exposure to decaying vegetation. ?Avoid leaf raking. ?Avoid grain handling. ?Consider wearing a face mask if working in moldy areas.  ?Indoor (Perennial) Mold Control  ?Maintain humidity below 50%. ?Get rid of mold growth on hard surfaces with water, detergent and, if necessary, 5% bleach (do not mix with other cleaners). Then dry the area completely. If mold covers an area more than 10 square feet, consider hiring an indoor environmental professional. ?For clothing, washing with soap and water is best. If moldy items cannot be cleaned and dried, throw them away. ?Remove sources e.g. contaminated carpets. ?Repair and seal leaking roofs or pipes. Using dehumidifiers in damp basements may be helpful, but  empty the water and clean units regularly to prevent mildew from forming. All rooms, especially basements, bathrooms and kitchens, require ventilation and cleaning to deter mold and  mildew growth. Avoid carpeting on concrete or damp floors, and storing items in damp areas. ?Control of House Dust Mite Allergen ?Dust mite allergens are a common trigger of allergy and asthma symptoms. While they can be found throughout the house, these microscopic creatures thrive in warm, humid environments such as bedding, upholstered furniture and carpeting. ?Because so much time is spent in the bedroom, it is essential to reduce mite levels there.  ?Encase pillows, mattresses, and box springs in special allergen-proof fabric covers or airtight, zippered plastic covers.  ?Bedding should be washed weekly in hot water (130? F) and dried in a hot dryer. Allergen-proof covers are available for comforters and pillows that can?t be regularly washed.  ?Wash the allergy-proof covers every few months. Minimize clutter in the bedroom. Keep pets out of the bedroom.  ?Keep humidity less than 50% by using a dehumidifier or air conditioning. You can buy a humidity measuring device called a hygrometer to monitor this.  ?If possible, replace carpets with hardwood, linoleum, or washable area rugs. If that's not possible, vacuum frequently with a vacuum that has a HEPA filter. ?Remove all upholstered furniture and non-washable window drapes from the bedroom. ?Remove all non-washable stuffed toys from the bedroom.  Wash stuffed toys weekly. ?Pet Allergen Avoidance: ?Contrary to popular opinion, there are no ?hypoallergenic? breeds of dogs or cats. That is because people are not allergic to an animal?s hair, but to an allergen found in the animal's saliva, dander (dead skin flakes) or urine. Pet allergy symptoms typically occur within minutes. For some people, symptoms can build up and become most severe 8 to 12 hours after contact with the animal. People with severe allergies can experience reactions in public places if dander has been transported on the pet owners? clothing. ?Keeping an animal outdoors is only a partial  solution, since homes with pets in the yard still have higher concentrations of animal allergens. ?Before getting a pet, ask your allergist to determine if you are allergic to animals. If your pet is already considered part of your family, try to minimize contact and keep the pet out of the bedroom and other rooms where you spend a great deal of time. ?As with dust mites, vacuum carpets often or replace carpet with a hardwood floor, tile or linoleum. ?High-efficiency particulate air (HEPA) cleaners can reduce allergen levels over time. ?While dander and saliva are the source of cat and dog allergens, urine is the source of allergens from rabbits, hamsters, mice and [place] pigs; so ask a non-allergic family member to clean the animal?s cage. ?If you have a pet allergy, talk to your allergist about the potential for allergy immunotherapy (allergy shots). This strategy can often provide long-term relief. ?

## 2021-06-24 NOTE — Progress Notes (Signed)
? ?New Patient Note ? ?RE: Shelley Norman MRN: [id number] DOB: 1970-06-10 ?Date of Office Visit: 06/24/2021 ? ?Consult requested by: Tanda Rockers, MD ?Primary care provider: Flossie Buffy, NP ? ?Chief Complaint: Cough (Last July diagnoses with Bronchitis: Saw Dr. Melvyn Novas, got singular and some other medications, 4 rounds of prednisone, on Claritin; It has gotten better but still there and the more she talks the worse the cough gets) ? ?History of Present Illness: ?I had the pleasure of seeing Shelley Norman for initial evaluation at the [facility] on 06/25/2021. She is a 51 y.o. female, who is referred here by Dr. Melvyn Novas for the evaluation of upper airway cough syndrome. ? ?She reports symptoms of dry coughing, nocturnal awakenings since July 2022. ? ?Patient was diagnosed with bronchitis at that time and she had some issues with her air conditioner at that time. ?She was treated with prednisone x 4, promethazine DM, antibiotics x 1.  ? ?She had history of prolonged cough prior to this for 3 months - treated with albuterol and Tussinex cough syrup with good benefit.  ? ?Current medications include Singulair which help. She tried the following inhalers: albuterol. Main triggers are unknown - but worse after talking a lot. In the last month, frequency of symptoms: daily. Frequency of SABA use: 0x/week. Interference with physical activity: no. In the last 12 months, emergency room visits/urgent care visits/doctor office visits or hospitalizations due to respiratory issues: no. In the last 12 months, oral steroids courses: 4 courses. Lifetime history of hospitalization for respiratory issues: no. Prior intubations: no. History of pneumonia: no. She was evaluated by pulmonologist in the past. Smoking exposure: denies. Up to date with flu vaccine: yes. Up to date with COVID-19 vaccine: yes. Prior Covid-19 infection: no. ?History of reflux: currently taking Prilosec with minimal benefit. ? ?She  reports symptoms of PND, rhinorrhea, sneezing, itchy/watery eyes. Symptoms have been going on for a few months. Other triggers include exposure to unknown. Anosmia: no. Headache: no. She has used Singulair, Claritin with some improvement in symptoms. Sinus infections: no. Previous work up includes: none. ?Previous ENT evaluation: no, no prior sinus surgery ?Previous sinus imaging: no. ?History of nasal polyps: no. ?Last eye exam: last year. ? ?Not on ace inhibitor. ?Denies cardiac issues. ? ?05/05/2021 pulmonology visit: ?"Recurrent on episode x  2011  Then recurred  since July 2022  ?- flared 1st part of July 2022 p ? Exp to moldy flooring p a/c leak>  Professionally cleared  ?- Allergy profile 10/30/2020 >  Eos 0.1 /  IgE  112 ?- gabapentin 100 mg tid 01/02/2021 >>> not able to take daytime  ?- 02/09/2021 rec gradually increase pm dose of gabapentin until cough eliminated> could not tol cns effects ?- 05/05/2021 restrart nightly singulair and 1st gen H1 blockers per guidelines/ pred x 6 d/   otc gerd rx until no cough x one week and if not better allergy eval next" ? ?01/02/2021 CXR: ?"IMPRESSION: ?No radiographic evidence of acute cardiopulmonary disease." ? ?Assessment and Plan: ?[name] is a 51 y.o. female with: ?Chronic coughing ?Daily dry coughing since July 2022.  At that time she had bronchitis and was treated with prednisone x4, antibiotics and Promethazine DM. Followed by pulmonology. PPI ineffective.  Not on Ace-inh and denies cardiac issues. 2022 CXR unremarkable. ?Today's skin testing showed: Positive to grass, trees, ragweed, weed, mold, cat and dust mites. Negative to common foods.  ?Today's spirometry showed normal pattern with 4% improvement  in FEV1 post bronchodilator treatment. Clinically feeling improved.  ?Coughing resolved. ?Daily controller medication(s): Start Breo 111mg 1 puff once a day and rinse mouth after each use. Demonstrated proper use.  ?May use albuterol rescue inhaler 2 puffs every 4 to 6  hours as needed for shortness of breath, chest tightness, coughing, and wheezing. May use albuterol rescue inhaler 2 puffs 5 to 15 minutes prior to strenuous physical activities. Monitor frequency of use.  ?Get spirometry at next visit. ?Stop PPI as ineffective. ? ?Other allergic rhinitis ?Rhinoconjunctivitis symptoms for a few months.  Taking Singulair and Claritin with some benefit.  No prior allergy/ENT evaluation. ?Today's skin testing showed: Positive to grass, trees, ragweed, weed, mold, cat and dust mites. ?Start environmental control measures as below. ?Use over the counter antihistamines such as Zyrtec (cetirizine), Claritin (loratadine), Allegra (fexofenadine), or Xyzal (levocetirizine) daily as needed. May take twice a day during allergy flares. May switch antihistamines every few months. ?Continue Singulair (montelukast) '10mg'$  daily at night. ?Start Ryaltris (olopatadine + mometasone nasal spray combination) 1-2 sprays per nostril twice a day. Sample given. ? ?Return in about 2 months (around 08/24/2021). ? ?Meds ordered this encounter  ?Medications  ? Olopatadine-Mometasone (RYALTRIS) [id number]/ACT SUSP  ?  Sig: Place 1-2 sprays into the nose in the morning and at bedtime.  ?  Dispense:  29 g  ?  Refill:  5  ?  [phone] ? fluticasone furoate-vilanterol (BREO ELLIPTA) 100-25 MCG/ACT AEPB  ?  Sig: Inhale 1 puff into the lungs daily. Rinse mouth after each use.  ?  Dispense:  60 each  ?  Refill:  3  ? albuterol (VENTOLIN HFA) 108 (90 Base) MCG/ACT inhaler  ?  Sig: Inhale 2 puffs into the lungs every 4 (four) hours as needed for wheezing or shortness of breath (coughing fits).  ?  Dispense:  18 g  ?  Refill:  1  ? ?Lab Orders  ?No laboratory test(s) ordered today  ? ? ?Other allergy screening: ?Food allergy: no ?Medication allergy: no ?Hymenoptera allergy: no ?Urticaria: no ?Eczema:no ?History of recurrent infections suggestive of immunodeficency: no ? ?Diagnostics: ?Spirometry:  ?Tracings reviewed. Her  effort: Good reproducible efforts. ?FVC: 2.44L ?FEV1: 2.25L, 106% predicted ?FEV1/FVC ratio: 92% ?Interpretation: Spirometry consistent with normal pattern with 4% improvement in FEV1 post bronchodilator treatment. Clinically feeling improved.  ? ?Please see scanned spirometry results for details. ? ?Skin Testing: Environmental allergy panel and select foods. ?Positive to grass, trees, ragweed, weed, mold, cat and dust mites. ?Negative to common foods.  ?Results discussed with patient/family. ? Airborne Adult Perc - 06/24/21 1532   ? ? Time Antigen Placed 15093  ? Allergen Manufacturer GLavella Hammock  ? Location Back   ? Number of Test 59   ? 2. Control-Histamine 1 mg/ml 2+   ? 4. BFranklinNegative   ? 5. BGuatemalaNegative   ? 6. Johnson 2+   ? 7. KDatilBlue Negative   ? 8. Meadow Fescue Negative   ? 9. Perennial Rye Negative   ? 10. Sweet Vernal Negative   ? 11. Timothy 3+   ? 12. Cocklebur Negative   ? 13. Burweed Marshelder Negative   ? 14. Ragweed, short Negative   ? 15. Ragweed, Giant Negative   ? 16. Plantain,  English Negative   ? 17. Lamb's Quarters Negative   ? 18. Sheep Sorrell Negative   ? 19. Rough Pigweed Negative   ? 20. Marsh Elder, Rough Negative   ? 21.  Mugwort, Common Negative   ? 22. Ash mix Negative   ? 23. Wendee Copp mix Negative   ? 24. Marathon Oil --   +/-  ? 25. Box, Elder Negative   ? 26. Cedar, red Negative   ? 27. Cottonwood, [place] Negative   ? 28. Elm mix Negative   ? 29. Hickory Negative   ? 30. Maple mix Negative   ? 31. Oak, [place] mix --   +/*  ? 32. Pecan Pollen Negative   ? 33. Pine mix Negative   ? 34. Sycamore Eastern Negative   ? 35. [place], Black Pollen 2+   ? 36. Alternaria alternata Negative   ? 82. Cladosporium Herbarum 2+   ? 38. Aspergillus mix Negative   ? 39. Penicillium mix Negative   ? 40. Bipolaris sorokiniana (Helminthosporium) Negative   ? 41. Drechslera spicifera (Curvularia) 2+   ? 42. Mucor plumbeus Negative   ? 43. Fusarium moniliforme Negative   ? 44. Aureobasidium  pullulans (pullulara) Negative   ? 45. Rhizopus oryzae Negative   ? 46. Botrytis cinera Negative   ? 47. Epicoccum nigrum Negative   ? 48. Phoma betae Negative   ? 49. Candida Albicans Negative   ? 50. Tric

## 2021-06-24 NOTE — Telephone Encounter (Signed)
I received a return email from the patient requesting that Question 7 be revised to state the duration of absences would be 8 hours or up to 1 day.  This change is required because her work shifts start at 10:00am and afternoon appointments often take up more than 4 hours.  Dr. Melvyn Novas made handwritten corrections, initialed and dated the changes and I emailed the revised form back to the patient.  ?

## 2021-06-25 ENCOUNTER — Other Ambulatory Visit: Payer: Self-pay | Admitting: *Deleted

## 2021-06-25 ENCOUNTER — Encounter: Payer: Self-pay | Admitting: Allergy

## 2021-06-25 MED ORDER — BREO ELLIPTA 100-25 MCG/ACT IN AEPB: 1.0000 | INHALATION_SPRAY | Freq: Every day | RESPIRATORY_TRACT | 3 refills | Status: DC

## 2021-06-25 NOTE — Assessment & Plan Note (Signed)
Daily dry coughing since July 2022.  At that time she had bronchitis and was treated with prednisone x4, antibiotics and Promethazine DM. Followed by pulmonology. PPI ineffective.  Not on Ace-inh and denies cardiac issues. 2022 CXR unremarkable. ?? Today's skin testing showed: Positive to grass, trees, ragweed, weed, mold, cat and dust mites. Negative to common foods.  ?? Today's spirometry showed normal pattern with 4% improvement in FEV1 post bronchodilator treatment. Clinically feeling improved.  ?? Coughing resolved. ?? Daily controller medication(s): Start Breo 127mg 1 puff once a day and rinse mouth after each use. Demonstrated proper use.  ?? May use albuterol rescue inhaler 2 puffs every 4 to 6 hours as needed for shortness of breath, chest tightness, coughing, and wheezing. May use albuterol rescue inhaler 2 puffs 5 to 15 minutes prior to strenuous physical activities. Monitor frequency of use.  ?? Get spirometry at next visit. ?? Stop PPI as ineffective. ?

## 2021-06-25 NOTE — Assessment & Plan Note (Signed)
Rhinoconjunctivitis symptoms for a few months.  Taking Singulair and Claritin with some benefit.  No prior allergy/ENT evaluation. ?? Today's skin testing showed: Positive to grass, trees, ragweed, weed, mold, cat and dust mites. ?? Start environmental control measures as below. ?? Use over the counter antihistamines such as Zyrtec (cetirizine), Claritin (loratadine), Allegra (fexofenadine), or Xyzal (levocetirizine) daily as needed. May take twice a day during allergy flares. May switch antihistamines every few months. ?? Continue Singulair (montelukast) '10mg'$  daily at night. ?? Start Ryaltris (olopatadine + mometasone nasal spray combination) 1-2 sprays per nostril twice a day. Sample given. ?

## 2021-06-26 ENCOUNTER — Telehealth: Payer: Self-pay | Admitting: *Deleted

## 2021-06-26 NOTE — Telephone Encounter (Signed)
PA has been submitted through CoverMyMeds for Ryaltris and is currently pending approval/denial.  

## 2021-06-29 NOTE — Telephone Encounter (Signed)
PA has been approved for Ryaltris and sent electronically to the pharmacy. PA is good for one year.  ?

## 2021-07-11 NOTE — Progress Notes (Signed)
? ? Patient ID: Shelley Norman, female    DOB: [birth year], 51 y.o.   MRN: [id number] ? ? ? ?03/24/12- 61 yo F never smoker followed for allergic rhinitis and cough. ?Yearly Follow up.  Cold started last week -- started with sore throat.  Now has some nasal congestion and slight nonprod cough.  No SOB, wheezing, chest tightness, chest pain, or f/c/s. Had flu vaccine. ?Had a cold last week but now spontaneously improving. Otherwise no significant problems over past year. She is not regularly using any of her pulmonary medicines including Singulair. ?CXR 06/05/10 ?IMPRESSION:  ?Negative chest.  ?Original Report Authenticated By: Resa Miner. [name], M.D.  ? ?07/13/21- 28 yoF never smoker, previously seen in 2013 for Allergic Rhinitis andd Cough. Coming now to re-establish. ?Had seen Dr Melvyn Novas for upper airway cough> gabapentin, prilosec/pepcid, Singulair,   ?Has worked with Allergist.> [facility] for grass, trees, ragweed, weed, mold, cat and dust mites. Negative to common foods. > Breo 100, Ryaltris nasal spray, Ventolin hfa, ?Has had several prednisone tapers, albuterol inhaler,  ?- Breo 100, Singulair, Ventolin hfa, Promethazine-DM,  ?IgE 112, EOS 0.1 ?She remembers cough as starting at a time when her air conditioning had broken down.  Subsequently had HVAC duct work cleaned.  Cough is persisted.  Lives with mother who has not had any similar problem.  No benefit from acid blockers and denies reflux symptoms.  Not using ACE inhibitors.  Medications reviewed. ?CXR 01/05/21- ?No radiographic evidence of acute cardiopulmonary disease ? ? ?Review of Systems-See HPI ?Constitutional:   No-   weight loss, night sweats, fevers, chills, fatigue, lassitude. ?HEENT:   No-  headaches, difficulty swallowing, tooth/dental problems, sore throat,  ?     No-  sneezing, itching, ear ache, +nasal congestion, post nasal drip,  ?CV:  No-   chest pain, orthopnea, PND, swelling in lower extremities, anasarca, dizziness, palpitations ?Resp:  No-   shortness of breath with exertion or at rest.   ?           No-   productive cough,  No non-productive cough,  No- coughing up of blood.   ?           No-   change in color of mucus.  No- wheezing.   ?Skin: No-   rash or lesions. ?GI:  No-   heartburn, indigestion, abdominal pain, nausea, vomiting,  ?GU:  ?MS:  No-   joint pain or swelling.  Marland Kitchen ?Neuro-     nothing unusual ?Psych:  No- change in mood or affect. No depression or anxiety.  No memory loss. ?   ?Objective:  ? Physical Exam ?General- Alert, Oriented, Affect-appropriate, Distress- none acute ?Skin- rash-none, lesions- none, excoriation- none ?Lymphadenopathy- none ?Head- atraumatic ?           Eyes- Gross vision intact, PERRLA, conjunctivae clear secretions ?           Ears- Hearing, canals-normal ?           Nose- Clear, no-Septal dev, mucus, polyps, erosion, perforation  ?           Throat- Mallampati II , mucosa clear , drainage- none, tonsils- atrophic; torus ?Neck- flexible , trachea midline, no stridor , thyroid nl, carotid no bruit ?Chest - symmetrical excursion , unlabored ?          Heart/CV- RRR , no murmur , no gallop  , no rub, nl s1 s2  ?         -  JVD- none , edema- none, stasis changes- none, varices- none ? ?          Lung- clear to P&A, wheeze- none, cough+spastic dry  , dullness-none, rub- none ?          Chest wall-  ?Abd-  ?Br/ Gen/ Rectal- Not done, not indicated ?Extrem- cyanosis- none, clubbing, none, atrophy- none, strength- nl ?Neuro- grossly intact to observation ? ? ?

## 2021-07-13 ENCOUNTER — Encounter: Payer: Self-pay | Admitting: Internal Medicine

## 2021-07-13 ENCOUNTER — Ambulatory Visit: Payer: Managed Care, Other (non HMO) | Admitting: Internal Medicine

## 2021-07-13 VITALS — BP 118/80 | HR 102 | Temp 98.2°F | Ht 61.0 in | Wt 158.0 lb

## 2021-07-13 DIAGNOSIS — J301 Allergic rhinitis due to pollen: Secondary | ICD-10-CM

## 2021-07-13 DIAGNOSIS — R058 Other specified cough: Secondary | ICD-10-CM | POA: Diagnosis not present

## 2021-07-13 DIAGNOSIS — R053 Chronic cough: Secondary | ICD-10-CM

## 2021-07-13 LAB — CBC WITH DIFFERENTIAL/PLATELET
Basophils Absolute: 0 10*3/uL (ref 0.0–0.1)
Basophils Relative: 0.5 % (ref 0.0–3.0)
Eosinophils Absolute: 0 10*3/uL (ref 0.0–0.7)
Eosinophils Relative: 0.4 % (ref 0.0–5.0)
HCT: 37.8 % (ref 36.0–46.0)
Hemoglobin: 12.7 g/dL (ref 12.0–15.0)
Lymphocytes Relative: 27.8 % (ref 12.0–46.0)
Lymphs Abs: 1.8 10*3/uL (ref 0.7–4.0)
MCHC: 33.5 g/dL (ref 30.0–36.0)
MCV: 91.5 fl (ref 78.0–100.0)
Monocytes Absolute: 0.4 10*3/uL (ref 0.1–1.0)
Monocytes Relative: 6.5 % (ref 3.0–12.0)
Neutro Abs: 4.3 10*3/uL (ref 1.4–7.7)
Neutrophils Relative %: 64.8 % (ref 43.0–77.0)
Platelets: 335 10*3/uL (ref 150.0–400.0)
RBC: 4.13 Mil/uL (ref 3.87–5.11)
RDW: 13.6 % (ref 11.5–15.5)
WBC: 6.6 10*3/uL (ref 4.0–10.5)

## 2021-07-13 MED ORDER — HYDROCOD POLI-CHLORPHE POLI ER 10-8 MG/5ML PO SUER: ORAL | 0 refills | Status: DC

## 2021-07-13 NOTE — Assessment & Plan Note (Signed)
She remembers is coughing this beginning during a time of air conditioning problem that her home.  We will check for hypersensitivity pneumonia and allergy markers.  She has not responded to several rounds of prednisone, does not recognize reflux and did not respond to acid blockers. ?Plan-samples of Trelegy 200, lab for CBC with differential, eosinophils, hypersensitivity pneumonia panel.  Short-term trial of Tussionex to break the cycle. ?

## 2021-07-13 NOTE — Patient Instructions (Signed)
Order- lab CBC w diff, IgE, Hypersensitivity Pneumonia panel   dx cough ? ?Order- sample x 2 Trelegy 200    inhale 1 puff then rinse mouth, once daily ? ?Script sent for Tussionex - use very sparingly ?

## 2021-07-13 NOTE — Assessment & Plan Note (Signed)
She improved for a while but thinks nasal spray actually made her worse.  Not recognizing any important exposure triggers. ?Plan-consider trial of antihistamines for occult postnasal drainage triggering cough. ?

## 2021-07-14 LAB — IGE: IgE (Immunoglobulin E), Serum: 65 kU/L (ref <=114)

## 2021-07-16 MED ORDER — TRELEGY ELLIPTA 200-62.5-25 MCG/ACT IN AEPB: 1.0000 | INHALATION_SPRAY | Freq: Every day | RESPIRATORY_TRACT | 0 refills | Status: DC

## 2021-07-16 NOTE — Addendum Note (Signed)
Addended by: Elby Beck R on: 07/16/2021 10:41 AM ? ? Modules accepted: Orders ? ?

## 2021-07-20 LAB — HYPERSENSITIVITY PNEUMONITIS
A. Pullulans Abs: NEGATIVE
A.Fumigatus #1 Abs: NEGATIVE
Micropolyspora faeni, IgG: NEGATIVE
Pigeon Serum Abs: NEGATIVE
Thermoact. Saccharii: NEGATIVE
Thermoactinomyces vulgaris, IgG: NEGATIVE

## 2021-07-24 NOTE — Progress Notes (Signed)
LMOM for patient to call office back. Call back information provided.

## 2021-08-31 ENCOUNTER — Ambulatory Visit: Payer: Managed Care, Other (non HMO) | Admitting: Family

## 2021-10-12 NOTE — Progress Notes (Signed)
Patient ID: Shelley Norman, female    DOB: [birth year], 51 y.o.   MRN: [id number]   HPI F never smoker, followed for Allergic Rhinitis and Cough, complicated by osteoarthritis,  Had seen Dr Melvyn Novas for upper airway cough> gabapentin, prilosec/pepcid, Singulair,   Has worked with Allergist.> ST Pos for grass, trees, ragweed, weed, mold, cat and dust mites. Negative to common foods.  =========================================================  07/13/21- 64 yoF never smoker, previously seen in 2013 for Allergic Rhinitis andd Cough. Coming now to re-establish. Had seen Dr Melvyn Novas for upper airway cough> gabapentin, prilosec/pepcid, Singulair,   Has worked with Allergist.> ST Pos for grass, trees, ragweed, weed, mold, cat and dust mites. Negative to common foods. > Breo 100, Ryaltris nasal spray, Ventolin hfa, Has had several prednisone tapers, albuterol inhaler,  - Breo 100, Singulair, Ventolin hfa, Promethazine-DM,  IgE 112, EOS 0.1 She remembers cough as starting at a time when her air conditioning had broken down.  Subsequently had HVAC duct work cleaned.  Cough is persisted.  Lives with mother who has not had any similar problem.  No benefit from acid blockers and denies reflux symptoms.  Not using ACE inhibitors.  Medications reviewed. CXR 01/05/21- No radiographic evidence of acute cardiopulmonary disease  10/13/21- 51 yoF never smoker, followed for Allergic Rhinitis and Cough, complicated by osteoarthritis,  Had seen Dr Melvyn Novas for upper airway cough> gabapentin, prilosec/pepcid, Singulair,   Has worked with Allergist.> ST Pos for grass, trees, ragweed, weed, mold, cat and dust mites. Negative to common foods. > Breo 100, Ryaltris nasal spray, Ventolin hfa, - Trelegy 200, Singulair, Ventolin hfa, Promethazine-DM, chlorpheniramine,  Cough was previously a problem but she reports it has significantly resolved using Trelegy 200.  Aware of some rhinitis with postnasal drip.  Review of Systems-See  HPI Constitutional:   No-   weight loss, night sweats, fevers, chills, fatigue, lassitude. HEENT:   No-  headaches, difficulty swallowing, tooth/dental problems, sore throat,       No-  sneezing, itching, ear ache, +nasal congestion, post nasal drip,  CV:  No-   chest pain, orthopnea, PND, swelling in lower extremities, anasarca, dizziness, palpitations Resp: No-   shortness of breath with exertion or at rest.              No-   productive cough,  + non-productive cough,  No- coughing up of blood.              No-   change in color of mucus.  No- wheezing.   Skin: No-   rash or lesions. GI:  No-   heartburn, indigestion, abdominal pain, nausea, vomiting,  GU:  MS:  No-   joint pain or swelling.  . Neuro-     nothing unusual Psych:  No- change in mood or affect. No depression or anxiety.  No memory loss.    Objective:   Physical Exam General- Alert, Oriented, Affect-appropriate, Distress- none acute Skin- rash-none, lesions- none, excoriation- none Lymphadenopathy- none Head- atraumatic            Eyes- Gross vision intact, PERRLA, conjunctivae clear secretions            Ears- Hearing, canals-normal            Nose- Clear, no-Septal dev, mucus, polyps, erosion, perforation             Throat- Mallampati II , mucosa clear , drainage- none, tonsils- atrophic; torus Neck- flexible , trachea midline, no stridor , thyroid  nl, carotid no bruit Chest - symmetrical excursion , unlabored           Heart/CV- RRR , no murmur , no gallop  , no rub, nl s1 s2           - JVD- none , edema- none, stasis changes- none, varices- none            Lung- clear to P&A, wheeze- none, cough-none , dullness-none, rub- none           Chest wall-  Abd-  Br/ Gen/ Rectal- Not done, not indicated Extrem- cyanosis- none, clubbing, none, atrophy- none, strength- nl Neuro- grossly intact to observation

## 2021-10-13 ENCOUNTER — Ambulatory Visit: Payer: Managed Care, Other (non HMO) | Admitting: Internal Medicine

## 2021-10-13 ENCOUNTER — Encounter: Payer: Self-pay | Admitting: Internal Medicine

## 2021-10-13 DIAGNOSIS — J301 Allergic rhinitis due to pollen: Secondary | ICD-10-CM

## 2021-10-13 DIAGNOSIS — R053 Chronic cough: Secondary | ICD-10-CM

## 2021-10-13 MED ORDER — TRELEGY ELLIPTA 100-62.5-25 MCG/ACT IN AEPB: 1.0000 | INHALATION_SPRAY | Freq: Every day | RESPIRATORY_TRACT | 0 refills | Status: DC

## 2021-10-13 NOTE — Patient Instructions (Signed)
Order- sample x 2 Trelegy 100  inhale 1 puff then rinse mouth, once daily  We will see if we can find manufacturer's support from [employer] for Trelegy. If not, we may be able to try a different brand- Breztri.

## 2021-10-16 ENCOUNTER — Telehealth: Payer: Self-pay | Admitting: Internal Medicine

## 2021-10-16 NOTE — Telephone Encounter (Signed)
Per email request from patient - I have emailed her a copy of the AVS from the 10/13/21 appointment with Dr. [name].

## 2021-11-02 NOTE — Telephone Encounter (Signed)
Signed updated ADA form faxed to [facility], attn:  Charma Igo  fax# [phone].   Hard copy emailed to patient.

## 2021-11-12 ENCOUNTER — Encounter: Payer: Self-pay | Admitting: Nurse Practitioner

## 2021-11-12 ENCOUNTER — Ambulatory Visit (INDEPENDENT_AMBULATORY_CARE_PROVIDER_SITE_OTHER): Payer: Managed Care, Other (non HMO) | Admitting: Nurse Practitioner

## 2021-11-12 VITALS — BP 124/84 | HR 86 | Temp 97.5°F | Ht 61.0 in | Wt 162.8 lb

## 2021-11-12 DIAGNOSIS — Z0001 Encounter for general adult medical examination with abnormal findings: Secondary | ICD-10-CM

## 2021-11-12 DIAGNOSIS — E559 Vitamin D deficiency, unspecified: Secondary | ICD-10-CM | POA: Diagnosis not present

## 2021-11-12 DIAGNOSIS — N6019 Diffuse cystic mastopathy of unspecified breast: Secondary | ICD-10-CM | POA: Insufficient documentation

## 2021-11-12 DIAGNOSIS — Z13220 Encounter for screening for lipoid disorders: Secondary | ICD-10-CM

## 2021-11-12 DIAGNOSIS — Z124 Encounter for screening for malignant neoplasm of cervix: Secondary | ICD-10-CM

## 2021-11-12 DIAGNOSIS — Z136 Encounter for screening for cardiovascular disorders: Secondary | ICD-10-CM

## 2021-11-12 DIAGNOSIS — Z0000 Encounter for general adult medical examination without abnormal findings: Secondary | ICD-10-CM

## 2021-11-12 NOTE — Progress Notes (Signed)
Complete physical exam  Patient: Shelley Norman   DOB: Dec 03, 1970   51 y.o. Female  MRN: [id number] Visit Date: 11/15/2021  Subjective:    Chief Complaint  Patient presents with   Annual Exam    CPE Pt not fasting  No concerns    Shelley Norman is a 51 y.o. female who presents today for a complete physical exam. She reports consuming a general diet. Gym/ health club routine includes cardio. She generally feels well. She reports sleeping well. She does not have additional problems to discuss today.  Vision:Yes Dental:Yes STD Screen:No Will repeat mammogram.  Wt Readings from Last 3 Encounters:  11/12/21 162 lb 12.8 oz (73.8 kg)  10/13/21 161 lb 9.6 oz (73.3 kg)  07/13/21 158 lb (71.7 kg)    Most recent fall risk assessment:    11/12/2021    1:51 PM  [place] in the past year? 1  Number falls in past yr: 0  Injury with Fall? 1   Most recent depression screenings:    11/07/2020    1:44 PM 03/23/2016    3:42 PM  PHQ 2/9 Scores  PHQ - 2 Score 0 0  PHQ- 9 Score 2    HPI  Vitamin D deficiency Low vit. D: start 50000IU weekly x 12weeks New rx sent  Past Medical History:  Diagnosis Date   Allergic rhinitis    Bacterial infection    Dense breast tissue 11/07/2020   Fibroids    Mastodynia    Osteoarthritis    feet, knees, lower back - no meds   PONV (postoperative nausea and vomiting)    Vaginismus    Vitamin D deficiency    Yeast infection    Past Surgical History:  Procedure Laterality Date   FOOT SURGERY     bilateral 2 right and 2 left foot surgeries - totatl 4   HAMMER TOE SURGERY  2007   left foot - toe next to pinky   MYOMECTOMY N/A 01/08/2014   Procedure: Abdominal MYOMECTOMY;  Surgeon: Eldred Manges, MD;  Location: [facility];  Service: Gynecology;  Laterality: N/A;   WISDOM TOOTH EXTRACTION     Social History   Socioeconomic History   Marital status: Single    Spouse name: Not on file   Number of children: Not on file   Years of  education: Not on file   Highest education level: Not on file  Occupational History   Occupation: Consultant  Tobacco Use   Smoking status: Never    Passive exposure: Never   Smokeless tobacco: Never  Vaping Use   Vaping Use: Never used  Substance and Sexual Activity   Alcohol use: Yes    Alcohol/week: 1.0 standard drink of alcohol    Types: 1 Glasses of wine per week    Comment: socially   Drug use: No   Sexual activity: Not Currently    Partners: Male    Birth control/protection: Pill  Other Topics Concern   Not on file  Social History Narrative   Exercise---  daily   Social Determinants of Health   Financial Resource Strain: Not on file  Food Insecurity: Not on file  Transportation Needs: Not on file  Physical Activity: Not on file  Stress: Not on file  Social Connections: Not on file  Intimate Partner Violence: Not on file   Family Status  Relation Name Status   Mother  Alive   Father  (Not Specified)  Sister  (Not Specified)   Sister  (Not Specified)   Other  (Not Specified)   Neg Hx  (Not Specified)   Family History  Problem Relation Age of Onset   Hypertension Mother    Hyperlipidemia Mother    Arthritis Mother    Diabetes Father    Allergic rhinitis Sister    Breast cancer Sister 21   Breast cancer Sister 16   Osteoporosis Other    Colon cancer Neg Hx    Colon polyps Neg Hx    Esophageal cancer Neg Hx    Stomach cancer Neg Hx    Rectal cancer Neg Hx    Allergies  Allergen Reactions   Tetracycline Other (See Comments) and Itching    Yeast infection Yeast infection     Patient Care Team: Reilly Molchan, Charlene Brooke, NP as PCP - General (Internal Medicine)   Medications: Outpatient Medications Prior to Visit  Medication Sig   albuterol (VENTOLIN HFA) 108 (90 Base) MCG/ACT inhaler Inhale 2 puffs into the lungs every 4 (four) hours as needed for wheezing or shortness of breath (coughing fits).   Bee Pollen 1000 MG TABS Take 1 tablet by mouth daily.      Biotin 2.5 MG TABS Take 1 tablet by mouth daily.     chlorpheniramine (CHLOR-TRIMETON) 4 MG tablet Take 4 mg by mouth daily at 12 noon.   chlorpheniramine-HYDROcodone (TUSSIONEX) 10-8 MG/5ML Take 5 mLs by mouth daily as needed.   Cholecalciferol (VITAMIN D3) 2000 UNITS TABS Take 1 tablet by mouth daily.     Fluticasone-Umeclidin-Vilant (TRELEGY ELLIPTA) 100-62.5-25 MCG/ACT AEPB Inhale 1 puff into the lungs daily.   ibuprofen (ADVIL,MOTRIN) 600 MG tablet 1  po  pc every 6 hours for 5 days then prn-pain   loratadine (CLARITIN) 10 MG tablet Take 10 mg by mouth daily as needed for allergies.    Magnesium 250 MG TABS Take 1 tablet by mouth daily.     Misc Natural Products (OSTEO BI-FLEX ADV DOUBLE ST PO) Take 1 tablet by mouth 2 (two) times daily.     montelukast (SINGULAIR) 10 MG tablet Take 1 tablet (10 mg total) by mouth at bedtime.   norethindrone-ethinyl estradiol (OVCON-35) 0.4-35 MG-MCG tablet Vyfemla (28) 0.4 mg-35 mcg tablet   tretinoin (RETIN-A) 0.025 % cream Apply to back nightly   VOLTAREN 1 % GEL Apply 2 g topically 2 (two) times daily as needed (arthritis pain in knee).    [DISCONTINUED] Fluticasone-Umeclidin-Vilant (TRELEGY ELLIPTA) 200-62.5-25 MCG/ACT AEPB Inhale 1 puff into the lungs daily.   [DISCONTINUED] HYDROcodone-acetaminophen (NORCO/VICODIN) 5-325 MG tablet as needed.   [DISCONTINUED] promethazine-dextromethorphan (PROMETHAZINE-DM) 6.25-15 MG/5ML syrup Take 5 mLs by mouth 4 (four) times daily as needed for cough.   [DISCONTINUED] chlorpheniramine-HYDROcodone (TUSSIONEX PENNKINETIC ER) 10-8 MG/5ML 5 ml twice daily if needed for cough (Patient not taking: Reported on 11/12/2021)   No facility-administered medications prior to visit.    Review of Systems  Constitutional:  Negative for fever.  HENT:  Negative for congestion and sore throat.   Eyes:        Negative for visual changes  Respiratory:  Negative for cough and shortness of breath.   Cardiovascular:  Negative  for chest pain, palpitations and leg swelling.  Gastrointestinal:  Negative for blood in stool, constipation and diarrhea.  Genitourinary:  Negative for dysuria, frequency and urgency.  Musculoskeletal:  Negative for myalgias.  Skin:  Negative for rash.  Neurological:  Negative for dizziness and headaches.  Hematological:  Does not bruise/bleed  easily.  Psychiatric/Behavioral:  Negative for suicidal ideas. The patient is not nervous/anxious.        Objective:  BP 124/84 (BP Location: Right Arm, Patient Position: Sitting, Cuff Size: Normal)   Pulse 86   Temp (!) 97.5 F (36.4 C) (Temporal)   Ht '5\' 1"'$  (1.549 m)   Wt 162 lb 12.8 oz (73.8 kg)   SpO2 94%   BMI 30.76 kg/m     BP Readings from Last 3 Encounters:  11/12/21 124/84  10/13/21 116/72  07/13/21 118/80   Wt Readings from Last 3 Encounters:  11/12/21 162 lb 12.8 oz (73.8 kg)  10/13/21 161 lb 9.6 oz (73.3 kg)  07/13/21 158 lb (71.7 kg)   Physical Exam Vitals reviewed.  Constitutional:      General: She is not in acute distress.    Appearance: She is well-developed.  HENT:     Right Ear: Tympanic membrane, ear canal and external ear normal.     Left Ear: Tympanic membrane, ear canal and external ear normal.  Eyes:     Extraocular Movements: Extraocular movements intact.     Conjunctiva/sclera: Conjunctivae normal.     Pupils: Pupils are equal, round, and reactive to light.  Cardiovascular:     Rate and Rhythm: Normal rate and regular rhythm.     Pulses: Normal pulses.     Heart sounds: Normal heart sounds.  Pulmonary:     Effort: Pulmonary effort is normal. No respiratory distress.     Breath sounds: Normal breath sounds.  Chest:     Chest wall: No tenderness.  Abdominal:     General: Bowel sounds are normal.     Palpations: Abdomen is soft.  Genitourinary:    Comments: Deferred breast and pelvic exam to GYN Musculoskeletal:        General: Normal range of motion.     Cervical back: Normal range of motion  and neck supple.     Right lower leg: No edema.     Left lower leg: No edema.  Lymphadenopathy:     Cervical: No cervical adenopathy.  Skin:    General: Skin is warm and dry.  Neurological:     Mental Status: She is alert and oriented to person, place, and time.     Deep Tendon Reflexes: Reflexes are normal and symmetric.  Psychiatric:        Mood and Affect: Mood normal.        Behavior: Behavior normal.        Thought Content: Thought content normal.     No results found for any visits on 11/12/21.    Assessment & Plan:    Routine Health Maintenance and Physical Exam  Immunization History  Administered Date(s) Administered   Influenza Split 12/16/2010, 12/28/2011, 01/10/2013, 12/31/2013, 02/11/2015, 03/23/2016   Influenza Whole 02/15/2007, 01/15/2009   Influenza, Seasonal, Injecte, Preservative Fre 02/11/2015   Influenza,inj,Quad PF,6+ Mos 01/10/2013, 12/31/2013, 03/23/2016, 04/23/2021   Influenza,inj,Quad PF,6-35 Mos 01/19/2018, 01/15/2019   Influenza-Unspecified 12/16/2010, 12/28/2011, 01/10/2013, 12/31/2013, 02/11/2015, 03/23/2016   PFIZER(Purple Top)SARS-COV-2 Vaccination 06/22/2019, 07/13/2019, 02/01/2020, 04/23/2021   PNEUMOCOCCAL CONJUGATE-20 11/07/2020   Pneumococcal Polysaccharide-23 02/09/2011   Td 03/10/2005   Tdap 03/23/2016   Unspecified SARS-COV-2 Vaccination 07/13/2019   Zoster Recombinat (Shingrix) 11/07/2020, 04/09/2021    Health Maintenance  Topic Date Due   PAP SMEAR-Modifier  04/08/2018   INFLUENZA VACCINE  10/27/2021   COVID-19 Vaccine (6 - Mixed Product risk series) 11/28/2021 (Originally 06/18/2021)   Hepatitis C Screening  11/13/2022 (  Originally 07/24/1987)   MAMMOGRAM  01/05/2022   TETANUS/TDAP  03/23/2026   COLONOSCOPY (Pts 45-55yr Insurance coverage will need to be confirmed)  12/19/2030   HIV Screening  Completed   Zoster Vaccines- Shingrix  Completed   HPV VACCINES  Aged Out   FOOT EXAM  Discontinued   HEMOGLOBIN A1C  Discontinued    OPHTHALMOLOGY EXAM  Discontinued   URINE MICROALBUMIN  Discontinued   Discussed health benefits of physical activity, and encouraged her to engage in regular exercise appropriate for her age and condition.  Problem List Items Addressed This Visit       Other   Vitamin D deficiency    Low vit. D: start 50000IU weekly x 12weeks New rx sent      Relevant Medications   Vitamin D, Ergocalciferol, (DRISDOL) 1.25 MG (50000 UNIT) CAPS capsule   Other Relevant Orders   Vitamin D (25 hydroxy) (Completed)   Other Visit Diagnoses     Preventative health care    -  Primary   Relevant Orders   Comprehensive metabolic panel (Completed)   Lipid panel (Completed)   Encounter for lipid screening for cardiovascular disease       Relevant Orders   Lipid panel (Completed)      Return in about 1 year (around 11/13/2022) for CPE (fasting).     CWilfred Lacy NP

## 2021-11-12 NOTE — Patient Instructions (Addendum)
Sign medical release to get records form [facility]. Schedule lab appt for fasting labs Schedule appt for mammogram (due 12/2021)  For ankle and back pain: ok to use naproxen $RemoveBefo'220mg'aZLJQKyNEvE$  BID with food x 1week, then resume exercise. Call office for podiatry referral if no improvement in 3-4weeks.  Preventive Care 52-52 Years Old, Female Preventive care refers to lifestyle choices and visits with your health care provider that can promote health and wellness. Preventive care visits are also called wellness exams. What can I expect for my preventive care visit? Counseling Your health care provider may ask you questions about your: Medical history, including: Past medical problems. Family medical history. Pregnancy history. Current health, including: Menstrual cycle. Method of birth control. Emotional well-being. Home life and relationship well-being. Sexual activity and sexual health. Lifestyle, including: Alcohol, nicotine or tobacco, and drug use. Access to firearms. Diet, exercise, and sleep habits. Work and work Statistician. Sunscreen use. Safety issues such as seatbelt and bike helmet use. Physical exam Your health care provider will check your: Height and weight. These may be used to calculate your BMI (body mass index). BMI is a measurement that tells if you are at a healthy weight. Waist circumference. This measures the distance around your waistline. This measurement also tells if you are at a healthy weight and may help predict your risk of certain diseases, such as type 2 diabetes and high blood pressure. Heart rate and blood pressure. Body temperature. Skin for abnormal spots. What immunizations do I need?  Vaccines are usually given at various ages, according to a schedule. Your health care provider will recommend vaccines for you based on your age, medical history, and lifestyle or other factors, such as travel or where you work. What tests do I  need? Screening Your health care provider may recommend screening tests for certain conditions. This may include: Lipid and cholesterol levels. Diabetes screening. This is done by checking your blood sugar (glucose) after you have not eaten for a while (fasting). Pelvic exam and Pap test. Hepatitis B test. Hepatitis C test. HIV (human immunodeficiency virus) test. STI (sexually transmitted infection) testing, if you are at risk. Lung cancer screening. Colorectal cancer screening. Mammogram. Talk with your health care provider about when you should start having regular mammograms. This may depend on whether you have a family history of breast cancer. BRCA-related cancer screening. This may be done if you have a family history of breast, ovarian, tubal, or peritoneal cancers. Bone density scan. This is done to screen for osteoporosis. Talk with your health care provider about your test results, treatment options, and if necessary, the need for more tests. Follow these instructions at home: Eating and drinking  Eat a diet that includes fresh fruits and vegetables, whole grains, lean protein, and low-fat dairy products. Take vitamin and mineral supplements as recommended by your health care provider. Do not drink alcohol if: Your health care provider tells you not to drink. You are pregnant, may be pregnant, or are planning to become pregnant. If you drink alcohol: Limit how much you have to 0-1 drink a day. Know how much alcohol is in your drink. In the [place] one drink equals one 12 oz bottle of beer (355 mL), one 5 oz glass of wine (148 mL), or one 1 oz glass of hard liquor (44 mL). Lifestyle Brush your teeth every morning and night with fluoride toothpaste. Floss one time each day. Exercise for at least 30 minutes 5 or more days each week. Do  not use any products that contain nicotine or tobacco. These products include cigarettes, chewing tobacco, and vaping devices, such as  e-cigarettes. If you need help quitting, ask your health care provider. Do not use drugs. If you are sexually active, practice safe sex. Use a condom or other form of protection to prevent STIs. If you do not wish to become pregnant, use a form of birth control. If you plan to become pregnant, see your health care provider for a prepregnancy visit. Take aspirin only as told by your health care provider. Make sure that you understand how much to take and what form to take. Work with your health care provider to find out whether it is safe and beneficial for you to take aspirin daily. Find healthy ways to manage stress, such as: Meditation, yoga, or listening to music. Journaling. Talking to a trusted person. Spending time with friends and family. Minimize exposure to UV radiation to reduce your risk of skin cancer. Safety Always wear your seat belt while driving or riding in a vehicle. Do not drive: If you have been drinking alcohol. Do not ride with someone who has been drinking. When you are tired or distracted. While texting. If you have been using any mind-altering substances or drugs. Wear a helmet and other protective equipment during sports activities. If you have firearms in your house, make sure you follow all gun safety procedures. Seek help if you have been physically or sexually abused. What's next? Visit your health care provider once a year for an annual wellness visit. Ask your health care provider how often you should have your eyes and teeth checked. Stay up to date on all vaccines. This information is not intended to replace advice given to you by your health care provider. Make sure you discuss any questions you have with your health care provider. Document Revised: 09/10/2020 Document Reviewed: 09/10/2020 Elsevier Patient Education  [place].

## 2021-11-13 ENCOUNTER — Other Ambulatory Visit (INDEPENDENT_AMBULATORY_CARE_PROVIDER_SITE_OTHER): Payer: Managed Care, Other (non HMO)

## 2021-11-13 DIAGNOSIS — Z136 Encounter for screening for cardiovascular disorders: Secondary | ICD-10-CM | POA: Diagnosis not present

## 2021-11-13 DIAGNOSIS — Z0001 Encounter for general adult medical examination with abnormal findings: Secondary | ICD-10-CM | POA: Diagnosis not present

## 2021-11-13 DIAGNOSIS — Z13220 Encounter for screening for lipoid disorders: Secondary | ICD-10-CM | POA: Diagnosis not present

## 2021-11-13 DIAGNOSIS — E559 Vitamin D deficiency, unspecified: Secondary | ICD-10-CM

## 2021-11-13 LAB — LIPID PANEL
Cholesterol: 211 mg/dL — ABNORMAL HIGH (ref 0–200)
HDL: 74.5 mg/dL (ref >39.00)
LDL Cholesterol: 120 mg/dL — ABNORMAL HIGH (ref 0–99)
NonHDL: 136.28
Total CHOL/HDL Ratio: 3
Triglycerides: 80 mg/dL (ref 0.0–149.0)
VLDL: 16 mg/dL (ref 0.0–40.0)

## 2021-11-13 LAB — COMPREHENSIVE METABOLIC PANEL
ALT: 39 U/L — ABNORMAL HIGH (ref 0–35)
AST: 39 U/L — ABNORMAL HIGH (ref 0–37)
Albumin: 4.4 g/dL (ref 3.5–5.2)
Alkaline Phosphatase: 70 U/L (ref 39–117)
BUN: 14 mg/dL (ref 6–23)
CO2: 27 mEq/L (ref 19–32)
Calcium: 9.4 mg/dL (ref 8.4–10.5)
Chloride: 104 mEq/L (ref 96–112)
Creatinine, Ser: 0.97 mg/dL (ref 0.40–1.20)
GFR: 67.28 mL/min (ref >60.00)
Glucose, Bld: 93 mg/dL (ref 70–99)
Potassium: 4.3 mEq/L (ref 3.5–5.1)
Sodium: 140 mEq/L (ref 135–145)
Total Bilirubin: 0.5 mg/dL (ref 0.2–1.2)
Total Protein: 7 g/dL (ref 6.0–8.3)

## 2021-11-13 LAB — VITAMIN D 25 HYDROXY (VIT D DEFICIENCY, FRACTURES): VITD: 18.95 ng/mL — ABNORMAL LOW (ref 30.00–100.00)

## 2021-11-14 ENCOUNTER — Encounter: Payer: Self-pay | Admitting: Nurse Practitioner

## 2021-11-14 MED ORDER — VITAMIN D (ERGOCALCIFEROL) 1.25 MG (50000 UNIT) PO CAPS: 50000.0000 [IU] | ORAL_CAPSULE | ORAL | 0 refills | Status: DC

## 2021-11-14 NOTE — Assessment & Plan Note (Signed)
Low vit. D: start 50000IU weekly x 12weeks New rx sent

## 2021-11-15 NOTE — Addendum Note (Signed)
Addended by: Leana Gamer on: 11/15/2021 10:47 AM   Modules accepted: Orders

## 2021-11-16 ENCOUNTER — Other Ambulatory Visit: Payer: Self-pay | Admitting: *Deleted

## 2021-11-16 DIAGNOSIS — R058 Other specified cough: Secondary | ICD-10-CM

## 2021-11-16 MED ORDER — MONTELUKAST SODIUM 10 MG PO TABS: 10.0000 mg | ORAL_TABLET | Freq: Every day | ORAL | 11 refills | Status: DC

## 2021-11-20 ENCOUNTER — Telehealth: Payer: Self-pay | Admitting: Nurse Practitioner

## 2021-11-20 NOTE — Telephone Encounter (Signed)
Pt is wanting a call back concerning her most recent lab results. Please advise pt '@[phone]

## 2021-11-27 ENCOUNTER — Encounter: Payer: Self-pay | Admitting: Internal Medicine

## 2021-11-27 NOTE — Assessment & Plan Note (Signed)
Fair control currently.  Postnasal drip may contribute to her previous persistent cough.

## 2021-11-27 NOTE — Assessment & Plan Note (Signed)
Cough has improved with Trelegy 200 suggesting an asthmatic bronchitis mechanism. Plan-try samples of Trelegy 100 to see if she remains stable with lower steroid dose.  We will explore manufacturer's assistance.

## 2021-12-07 ENCOUNTER — Other Ambulatory Visit: Payer: Self-pay | Admitting: Nurse Practitioner

## 2021-12-07 DIAGNOSIS — Z1231 Encounter for screening mammogram for malignant neoplasm of breast: Secondary | ICD-10-CM

## 2022-01-06 ENCOUNTER — Ambulatory Visit
Admission: RE | Admit: 2022-01-06 | Discharge: 2022-01-06 | Disposition: A | Discharge status: Home / Self Care | Payer: Managed Care, Other (non HMO) | Source: Ambulatory Visit | Attending: Nurse Practitioner | Admitting: Nurse Practitioner

## 2022-01-06 ENCOUNTER — Encounter: Payer: Self-pay | Admitting: Radiology

## 2022-01-06 DIAGNOSIS — Z1231 Encounter for screening mammogram for malignant neoplasm of breast: Secondary | ICD-10-CM

## 2022-01-27 ENCOUNTER — Telehealth: Payer: Self-pay | Admitting: Internal Medicine

## 2022-01-29 MED ORDER — HYDROCOD POLI-CHLORPHE POLI ER 10-8 MG/5ML PO SUER: ORAL | 0 refills | Status: DC

## 2022-01-29 NOTE — Telephone Encounter (Signed)
Tussionex refilled. Please make sure she has her Trelegy. If she can't afford it we will change it to one she can afford, like Symbicort.

## 2022-01-29 NOTE — Telephone Encounter (Signed)
Spoke with pt who is c/o dry cough x 1 week which must occurs at night. Pt is requesting Tussionex. Dr. [name] please advise. Pharmcy is [facility] on [address].

## 2022-01-29 NOTE — Telephone Encounter (Signed)
Spoke with pt and notified her that Tussionex has been refilled. Pt is currently finishing off her samples of Trelegy and will let us know when she goes to refill her Trelegy  if she can it afford it. Nothing further needed at this time.

## 2022-02-15 ENCOUNTER — Ambulatory Visit: Payer: Managed Care, Other (non HMO) | Admitting: Adult Health

## 2022-02-17 ENCOUNTER — Encounter: Payer: Self-pay | Admitting: Primary Care

## 2022-02-17 ENCOUNTER — Ambulatory Visit (INDEPENDENT_AMBULATORY_CARE_PROVIDER_SITE_OTHER): Payer: Managed Care, Other (non HMO) | Admitting: Primary Care

## 2022-02-17 VITALS — BP 110/84 | HR 84 | Temp 99.4°F | Ht 65.25 in | Wt 164.8 lb

## 2022-02-17 DIAGNOSIS — J4489 Other specified chronic obstructive pulmonary disease: Secondary | ICD-10-CM | POA: Diagnosis not present

## 2022-02-17 DIAGNOSIS — J3089 Other allergic rhinitis: Secondary | ICD-10-CM | POA: Diagnosis not present

## 2022-02-17 MED ORDER — TRELEGY ELLIPTA 100-62.5-25 MCG/ACT IN AEPB: 1.0000 | INHALATION_SPRAY | Freq: Every day | RESPIRATORY_TRACT | 5 refills | Status: DC

## 2022-02-17 NOTE — Progress Notes (Signed)
$'@Patient'U$  ID: Shelley Norman, female    DOB: Feb 19, 1971, 51 y.o.   MRN: [id number]  Chief Complaint  Patient presents with   Follow-up    Referring provider: Nche, Charlene Brooke, NP  HPI: 51 year old female, never smoker.   Followed for Allergic Rhinitis and Cough, complicated by osteoarthritis,  Had seen Dr Melvyn Novas for upper airway cough> gabapentin, prilosec/pepcid, Singulair,   Has worked with Allergist.> ST Pos for grass, trees, ragweed, weed, mold, cat and dust mites. Negative to common foods.  Previous [facility] pulmonary encounter: 10/13/21- 51 yoF never smoker, followed for Allergic Rhinitis and Cough, complicated by osteoarthritis,  Had seen Dr Melvyn Novas for upper airway cough> gabapentin, prilosec/pepcid, Singulair,   Has worked with Allergist.> ST Pos for grass, trees, ragweed, weed, mold, cat and dust mites. Negative to common foods. > Breo 100, Ryaltris nasal spray, Ventolin hfa, - Trelegy 200, Singulair, Ventolin hfa, Promethazine-DM, chlorpheniramine,  Cough was previously a problem but she reports it has significantly resolved using Trelegy 200.  Aware of some rhinitis with postnasal drip.  02/17/2022- Interim hx  Patient presents today fu asthmatic bronchitis. Chronic cough improved on ICS/LABA/LAMA. Maintained on Trelegy Ellipta, dose was lowered to 16mg at her last visit in July. She has been doing well since then, recently just had a mild flare up of her cough requiring short term supply of Tussionex. She uses Trelegy as needed typically three times a week. No albuterol use. Taking Montelukast daily and Claritin as needed. No shortness of breath, chest titghtness or wheezing    Allergies  Allergen Reactions   Tetracycline Other (See Comments) and Itching    Yeast infection Yeast infection     Immunization History  Administered Date(s) Administered   Influenza Split 12/16/2010, 12/28/2011, 01/10/2013, 12/31/2013, 02/11/2015, 03/23/2016   Influenza Whole 02/15/2007,  01/15/2009   Influenza, Seasonal, Injecte, Preservative Fre 02/11/2015   Influenza,inj,Quad PF,6+ Mos 01/10/2013, 12/31/2013, 03/23/2016, 04/23/2021   Influenza,inj,Quad PF,6-35 Mos 01/19/2018, 01/15/2019   Influenza-Unspecified 12/16/2010, 12/28/2011, 01/10/2013, 12/31/2013, 02/11/2015, 03/23/2016   PFIZER(Purple Top)SARS-COV-2 Vaccination 06/22/2019, 07/13/2019, 02/01/2020, 04/23/2021   PNEUMOCOCCAL CONJUGATE-20 11/07/2020   Pneumococcal Polysaccharide-23 02/09/2011   Td 03/10/2005   Tdap 03/23/2016   Unspecified SARS-COV-2 Vaccination 07/13/2019   Zoster Recombinat (Shingrix) 11/07/2020, 04/09/2021    Past Medical History:  Diagnosis Date   Allergic rhinitis    Bacterial infection    Dense breast tissue 11/07/2020   Fibroids    Mastodynia    Osteoarthritis    feet, knees, lower back - no meds   PONV (postoperative nausea and vomiting)    Vaginismus    Vitamin D deficiency    Yeast infection     Tobacco History: Social History   Tobacco Use  Smoking Status Never   Passive exposure: Never  Smokeless Tobacco Never   Counseling given: Not Answered   Outpatient Medications Prior to Visit  Medication Sig Dispense Refill   albuterol (VENTOLIN HFA) 108 (90 Base) MCG/ACT inhaler Inhale 2 puffs into the lungs every 4 (four) hours as needed for wheezing or shortness of breath (coughing fits). 18 g 1   Bee Pollen 1000 MG TABS Take 1 tablet by mouth daily.       Biotin 2.5 MG TABS Take 1 tablet by mouth daily.       chlorpheniramine (CHLOR-TRIMETON) 4 MG tablet Take 4 mg by mouth daily at 12 noon.     chlorpheniramine-HYDROcodone (TUSSIONEX) 10-8 MG/5ML Take 5 mLs by mouth daily as needed.  chlorpheniramine-HYDROcodone (TUSSIONEX) 10-8 MG/5ML 5 ml every 12 hours if needed for cough 115 mL 0   Cholecalciferol (VITAMIN D3) 2000 UNITS TABS Take 1 tablet by mouth daily.       ibuprofen (ADVIL,MOTRIN) 600 MG tablet 1  po  pc every 6 hours for 5 days then prn-pain 30 tablet 1    loratadine (CLARITIN) 10 MG tablet Take 10 mg by mouth daily as needed for allergies.      Magnesium 250 MG TABS Take 1 tablet by mouth daily.       Misc Natural Products (OSTEO BI-FLEX ADV DOUBLE ST PO) Take 1 tablet by mouth 2 (two) times daily.       montelukast (SINGULAIR) 10 MG tablet Take 1 tablet (10 mg total) by mouth at bedtime. 30 tablet 11   norethindrone-ethinyl estradiol (OVCON-35) 0.4-35 MG-MCG tablet Vyfemla (28) 0.4 mg-35 mcg tablet     tretinoin (RETIN-A) 0.025 % cream Apply to back nightly     Vitamin D, Ergocalciferol, (DRISDOL) 1.25 MG (50000 UNIT) CAPS capsule Take 1 capsule (50,000 Units total) by mouth every 7 (seven) days. 12 capsule 0   VOLTAREN 1 % GEL Apply 2 g topically 2 (two) times daily as needed (arthritis pain in knee).      Fluticasone-Umeclidin-Vilant (TRELEGY ELLIPTA) 100-62.5-25 MCG/ACT AEPB Inhale 1 puff into the lungs daily. 2 each 0   No facility-administered medications prior to visit.      Review of Systems  Review of Systems  Constitutional: Negative.   HENT: Negative.    Respiratory:  Negative for cough, chest tightness, shortness of breath and wheezing.   Cardiovascular: Negative.      Physical Exam  BP 110/84 (BP Location: Left Arm, Patient Position: Sitting, Cuff Size: Normal)   Pulse 84   Temp 99.4 F (37.4 C) (Oral)   Ht 5' 5.25" (1.657 m)   Wt 164 lb 12.8 oz (74.8 kg)   LMP 11/25/2020 (Approximate)   SpO2 98%   BMI 27.21 kg/m  Physical Exam Constitutional:      Appearance: Normal appearance. She is not ill-appearing.  HENT:     Head: Normocephalic and atraumatic.     Right Ear: External ear normal. There is impacted cerumen.     Left Ear: Tympanic membrane, ear canal and external ear normal. There is no impacted cerumen.     Mouth/Throat:     Mouth: Mucous membranes are moist.     Pharynx: Oropharynx is clear.  Cardiovascular:     Rate and Rhythm: Normal rate and regular rhythm.  Pulmonary:     Effort: Pulmonary  effort is normal.     Breath sounds: Normal breath sounds.  Musculoskeletal:        General: Normal range of motion.     Cervical back: Normal range of motion and neck supple.  Skin:    General: Skin is warm and dry.  Neurological:     General: No focal deficit present.     Mental Status: She is alert and oriented to person, place, and time. Mental status is at baseline.  Psychiatric:        Mood and Affect: Mood normal.        Behavior: Behavior normal.        Thought Content: Thought content normal.        Judgment: Judgment normal.      Lab Results:  CBC    Component Value Date/Time   WBC 6.6 07/13/2021 1417   RBC 4.13 07/13/2021  1417   HGB 12.7 07/13/2021 1417   HCT 37.8 07/13/2021 1417   PLT 335.0 07/13/2021 1417   MCV 91.5 07/13/2021 1417   MCH 31.2 01/09/2014 0515   MCHC 33.5 07/13/2021 1417   RDW 13.6 07/13/2021 1417   LYMPHSABS 1.8 07/13/2021 1417   MONOABS 0.4 07/13/2021 1417   EOSABS 0.0 07/13/2021 1417   BASOSABS 0.0 07/13/2021 1417    BMET    Component Value Date/Time   NA 140 11/13/2021 1013   K 4.3 11/13/2021 1013   CL 104 11/13/2021 1013   CO2 27 11/13/2021 1013   GLUCOSE 93 11/13/2021 1013   BUN 14 11/13/2021 1013   CREATININE 0.97 11/13/2021 1013   CALCIUM 9.4 11/13/2021 1013   GFRNONAA 50 (L) 01/08/2014 0659   GFRAA 57 (L) 01/08/2014 0659    BNP No results found for: "BNP"  ProBNP No results found for: "PROBNP"  Imaging: No results found.   Assessment & Plan:   Asthmatic bronchitis , chronic - Clinical benefit from inhalers suggesting asthmatic bronchitis. Cough improved on triple therapy inhaler. Trelegy dose lowered in July 2023 and she has been doing well since. She had a recent mild flare up of her cough 2 weeks ago which improved with short term use of Tussionex. No SABA use.  - Continue Trelegy 119mg on puff daily prn cough, prn Albuterol and montelukast '10mg'$  at bedtime  - Trelegy RX sent to her pharamcy, she can call if  she needs sample next week  - Follow-up in July with Dr. YAnnamaria Boots  Other allergic rhinitis - Continue Montelukast '10mg'$  at bedtime - Continue Claritin '10mg'$  daily and Flonase nasal spray prn allergies    EMartyn Ehrich NP 02/17/2022

## 2022-02-17 NOTE — Assessment & Plan Note (Signed)
-   Continue Montelukast '10mg'$  at bedtime - Continue Claritin '10mg'$  daily and Flonase nasal spray prn allergies

## 2022-02-17 NOTE — Assessment & Plan Note (Addendum)
-   Clinical benefit from inhalers suggesting asthmatic bronchitis. Cough improved on triple therapy inhaler. Trelegy dose lowered in July 2023 and she has been doing well since. She had a recent mild flare up of her cough 2 weeks ago which improved with short term use of Tussionex. No SABA use.  - Continue Trelegy 124mg on puff daily prn cough, prn Albuterol and montelukast '10mg'$  at bedtime  - Trelegy RX sent to her pharamcy, she can call if she needs sample next week  - Follow-up in July with Dr. YAnnamaria Boots

## 2022-02-17 NOTE — Patient Instructions (Addendum)
Recommendations: Continue Trelegy 124mg as needed for cough  Use albuterol 2 puffs every 4-6 hours for breakthrough shortness of breath/wheezing  Continue Montelukast '10mg'$  at bedtime Take Claritin '10mg'$  daily as needed for allergies  If cough flares, use Trelegy daily and take cough medicine as needed. If not better may need visit for prednisone   Follow-up: July with Dr. YAnnamaria Boots/ call sooner if cough worsens

## 2022-02-19 ENCOUNTER — Ambulatory Visit: Payer: Managed Care, Other (non HMO) | Admitting: Internal Medicine

## 2022-02-24 ENCOUNTER — Telehealth: Payer: Self-pay | Admitting: Internal Medicine

## 2022-02-25 ENCOUNTER — Ambulatory Visit: Payer: Managed Care, Other (non HMO) | Admitting: Family Medicine

## 2022-02-25 NOTE — Telephone Encounter (Signed)
[place] # 1. The message to these people is that we don't get enough samples to cover people through their donut holes. If they can't afford their meds, then we need to change to something cheaper until insurance takes over again.

## 2022-02-25 NOTE — Telephone Encounter (Signed)
Dr [name], are you okay with me giving her a trelegy 100 sample?

## 2022-02-26 NOTE — Telephone Encounter (Signed)
LMTCB

## 2022-03-01 MED ORDER — TRELEGY ELLIPTA 100-62.5-25 MCG/ACT IN AEPB: 1.0000 | INHALATION_SPRAY | Freq: Every day | RESPIRATORY_TRACT | 0 refills | Status: DC

## 2022-03-01 NOTE — Telephone Encounter (Signed)
Sample of Trelegy obtained for pt. Attempted to call pt to let her know that this was done but unable to reach. Left a detailed message letting her know this was done. Nothing further needed.

## 2022-03-01 NOTE — Telephone Encounter (Signed)
Patient returning call- needing Trelegy sample

## 2022-03-04 ENCOUNTER — Ambulatory Visit: Payer: Managed Care, Other (non HMO) | Admitting: Internal Medicine

## 2022-04-01 DIAGNOSIS — M9903 Segmental and somatic dysfunction of lumbar region: Secondary | ICD-10-CM | POA: Diagnosis not present

## 2022-04-01 DIAGNOSIS — M9905 Segmental and somatic dysfunction of pelvic region: Secondary | ICD-10-CM | POA: Diagnosis not present

## 2022-04-01 DIAGNOSIS — M9902 Segmental and somatic dysfunction of thoracic region: Secondary | ICD-10-CM | POA: Diagnosis not present

## 2022-04-01 DIAGNOSIS — M531 Cervicobrachial syndrome: Secondary | ICD-10-CM | POA: Diagnosis not present

## 2022-04-01 DIAGNOSIS — M791 Myalgia, unspecified site: Secondary | ICD-10-CM | POA: Diagnosis not present

## 2022-04-01 DIAGNOSIS — M9901 Segmental and somatic dysfunction of cervical region: Secondary | ICD-10-CM | POA: Diagnosis not present

## 2022-04-01 IMAGING — MG MM DIGITAL SCREENING BILAT W/ TOMO AND CAD
8 series · 8 of 24 positions shown · non-contrast
Comparison: Previous exam(s).

CLINICAL DATA: Screening.

EXAM:
DIGITAL SCREENING BILATERAL MAMMOGRAM WITH TOMOSYNTHESIS AND CAD
TECHNIQUE: Bilateral screening digital craniocaudal and mediolateral oblique
mammograms were obtained. Bilateral screening digital breast
tomosynthesis was performed. The images were evaluated with
computer-aided detection.

[R CC synth-2D]
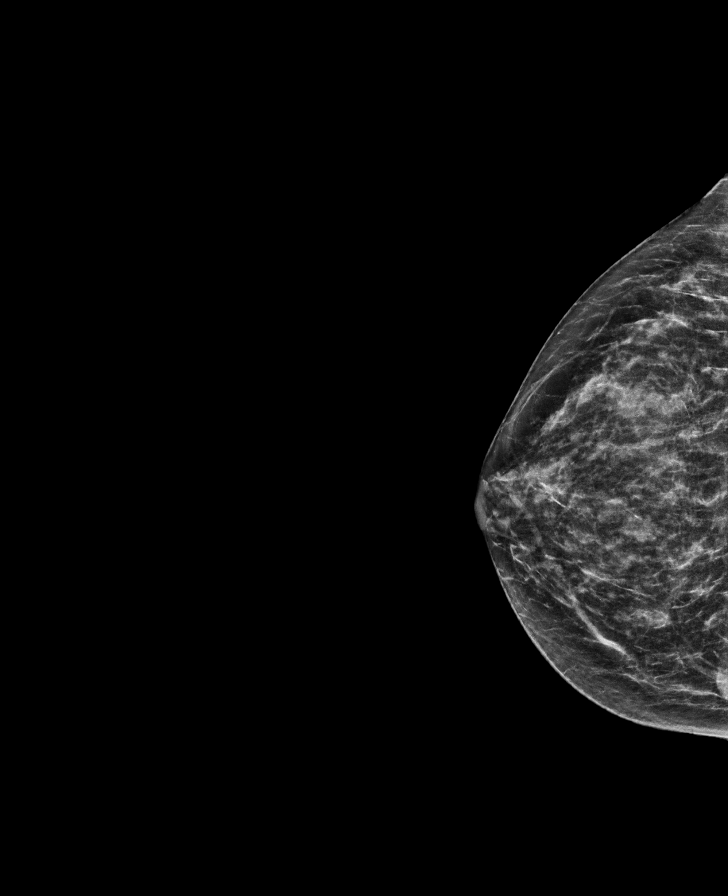

[L CC synth-2D]
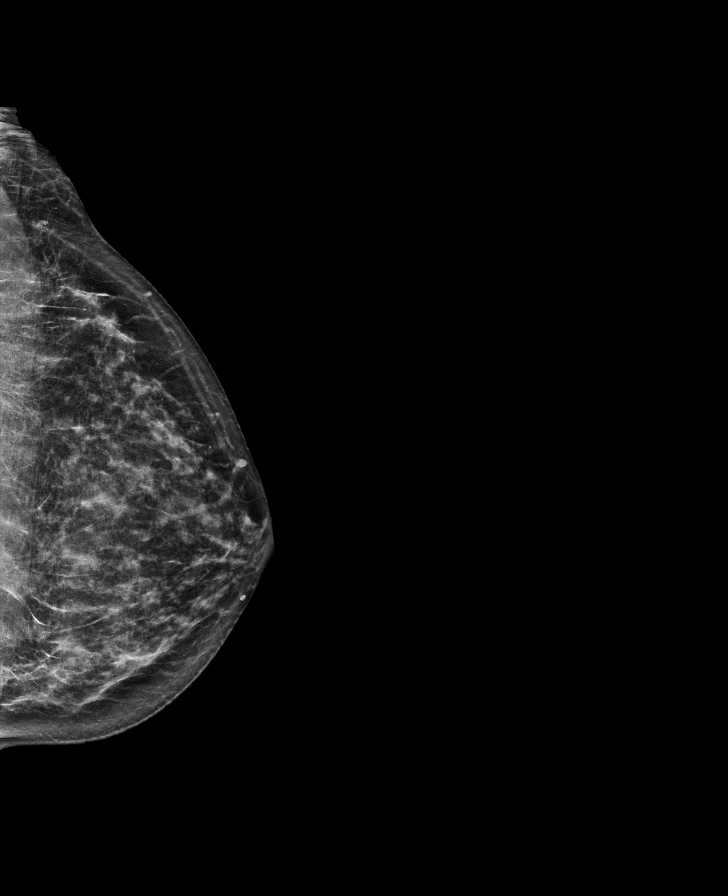

[L MLO synth-2D]
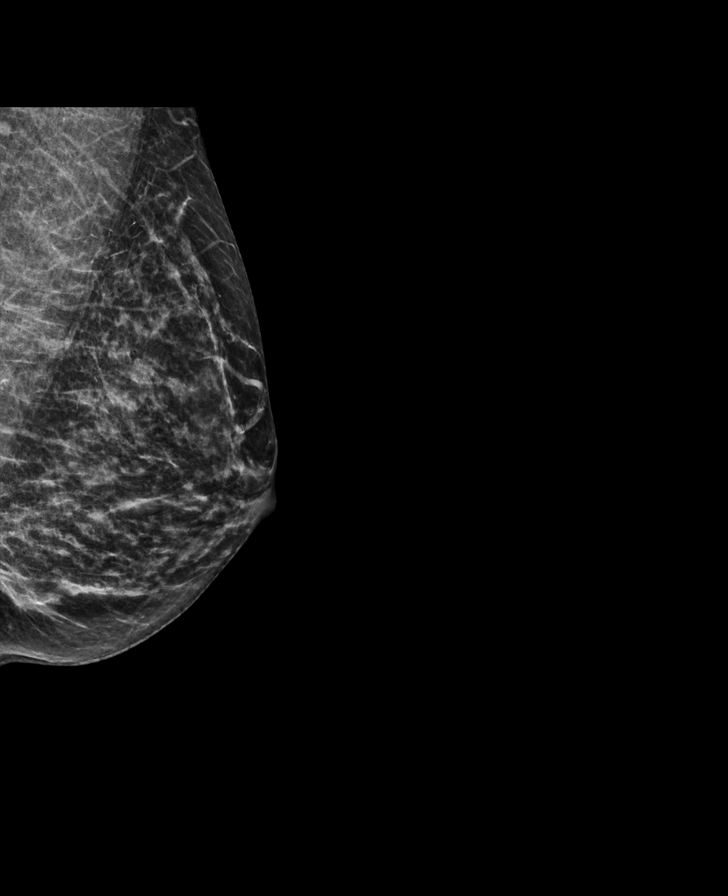

[R MLO synth-2D]
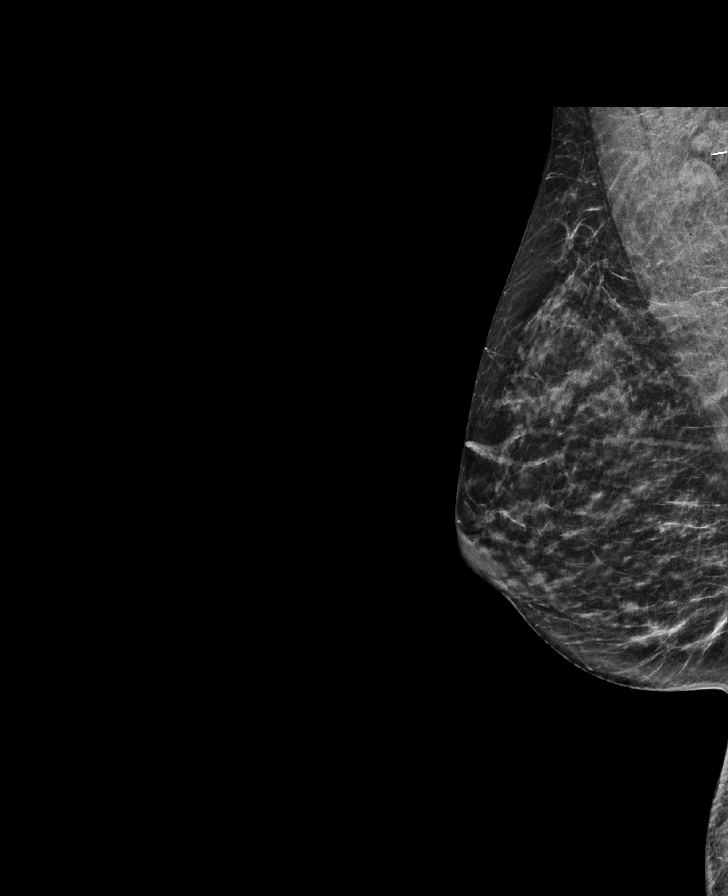

[L CC tomo · tomo slice 29/56.0]
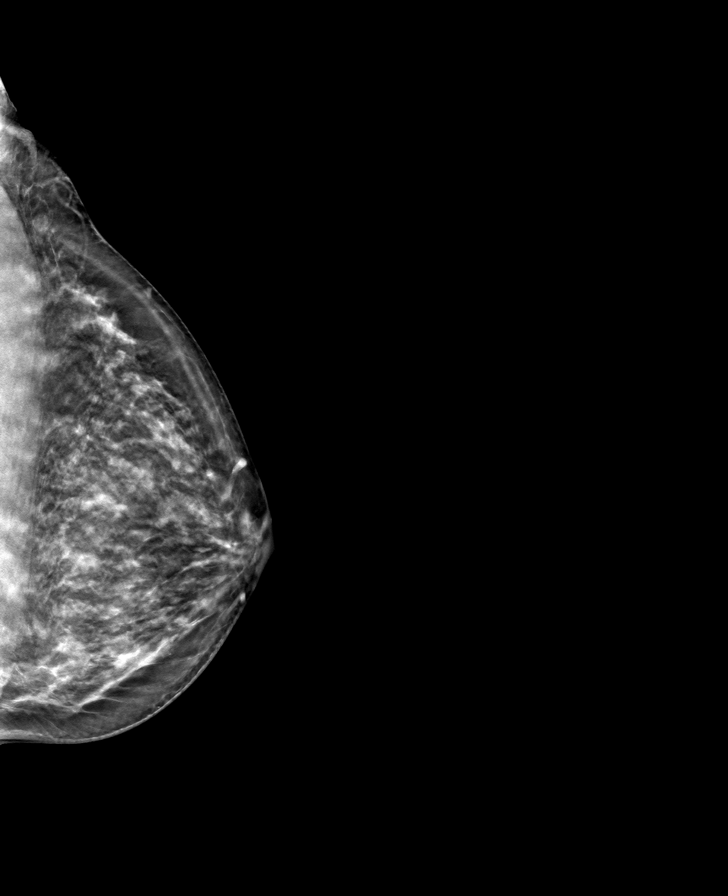

[R MLO tomo · tomo slice 27/54.0]
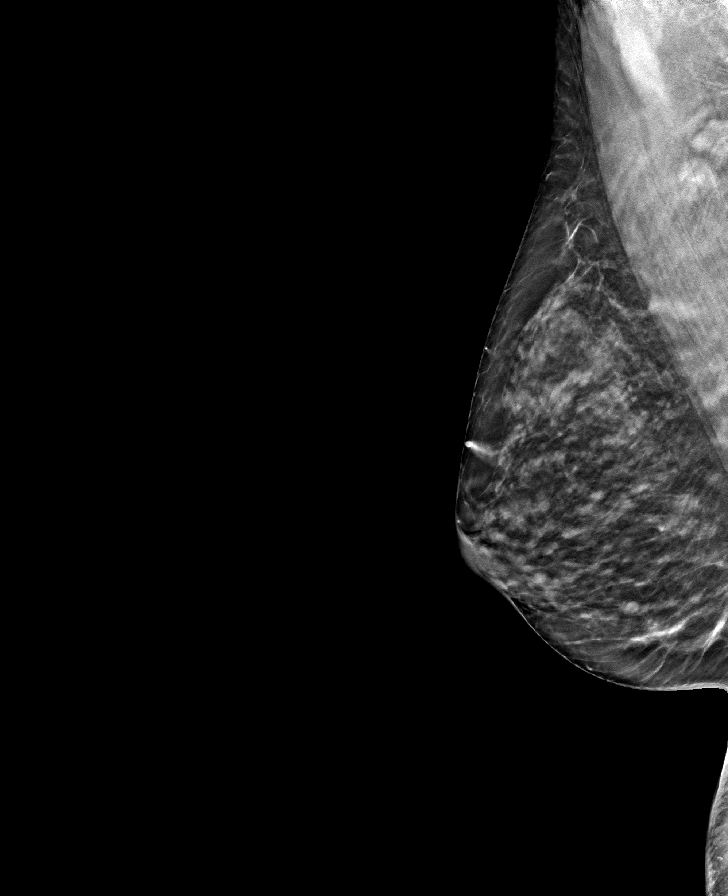

[L MLO tomo · tomo slice 25/49.0]
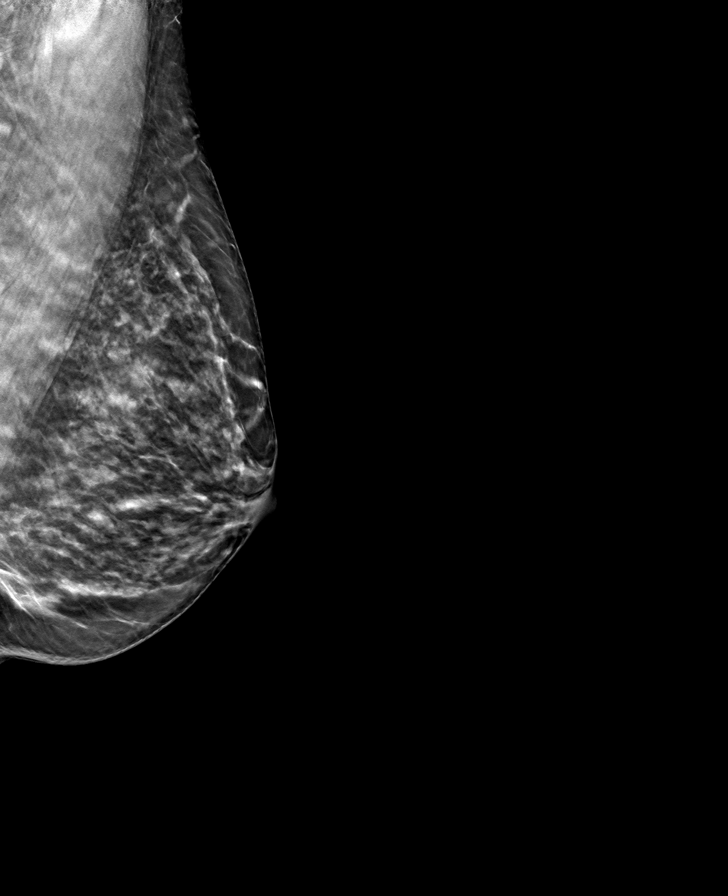

[R CC tomo · tomo slice 27/54.0]
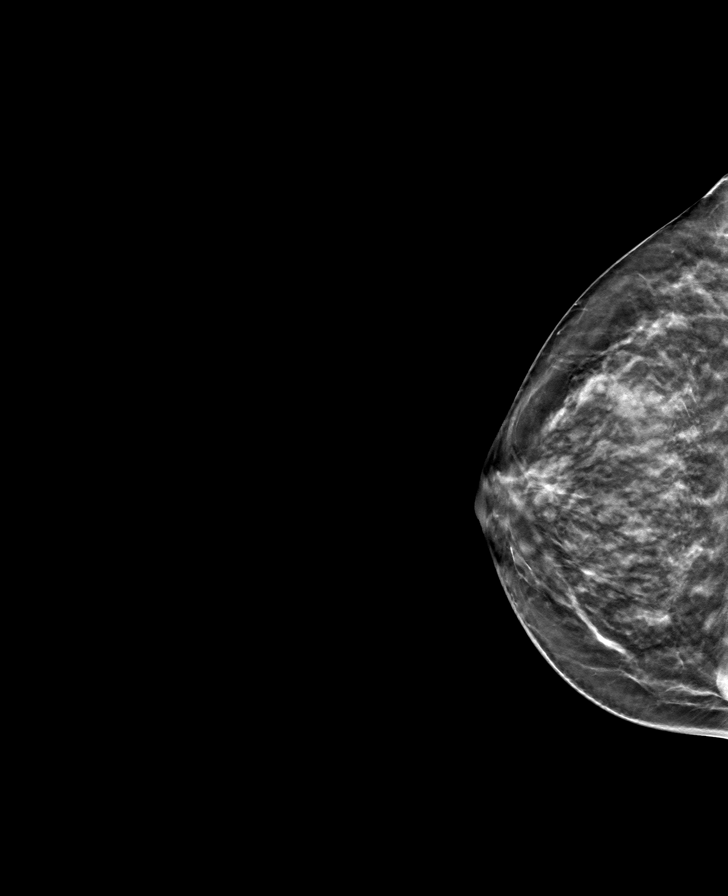

[8 of 24 positions shown; findings below may reference images not displayed]

ACR Breast Density Category c: The breast tissue is heterogeneously
dense, which may obscure small masses.
FINDINGS: There are no findings suspicious for malignancy.
IMPRESSION: No mammographic evidence of malignancy. A result letter of this
screening mammogram will be mailed directly to the patient.

RECOMMENDATION:
Screening mammogram in one year. (Code:Q3-W-BC3)

BI-RADS CATEGORY  1: Negative.

## 2022-04-06 ENCOUNTER — Other Ambulatory Visit (HOSPITAL_COMMUNITY): Payer: Self-pay | Admitting: Family Medicine

## 2022-04-06 DIAGNOSIS — M545 Low back pain, unspecified: Secondary | ICD-10-CM

## 2022-04-17 DIAGNOSIS — M5416 Radiculopathy, lumbar region: Secondary | ICD-10-CM | POA: Diagnosis not present

## 2022-04-21 DIAGNOSIS — M545 Low back pain, unspecified: Secondary | ICD-10-CM | POA: Diagnosis not present

## 2022-05-06 DIAGNOSIS — M9902 Segmental and somatic dysfunction of thoracic region: Secondary | ICD-10-CM | POA: Diagnosis not present

## 2022-05-06 DIAGNOSIS — M791 Myalgia, unspecified site: Secondary | ICD-10-CM | POA: Diagnosis not present

## 2022-05-06 DIAGNOSIS — M9903 Segmental and somatic dysfunction of lumbar region: Secondary | ICD-10-CM | POA: Diagnosis not present

## 2022-05-06 DIAGNOSIS — M9905 Segmental and somatic dysfunction of pelvic region: Secondary | ICD-10-CM | POA: Diagnosis not present

## 2022-05-06 DIAGNOSIS — M531 Cervicobrachial syndrome: Secondary | ICD-10-CM | POA: Diagnosis not present

## 2022-05-06 DIAGNOSIS — M9901 Segmental and somatic dysfunction of cervical region: Secondary | ICD-10-CM | POA: Diagnosis not present

## 2022-05-17 ENCOUNTER — Telehealth: Payer: Self-pay | Admitting: Family Medicine

## 2022-05-17 NOTE — Telephone Encounter (Signed)
Patient scheduled an appointment through [facility] for: "I want to schedule a lumbar PRP. I was told by an acquaintance that Dr. Tamala Julian performs these procedures. I don't want to schedule an appointment if this is not accurate. I've already had a consultation with [facility] and wasn't informed that Dr. Nelva Bush no longer does lumber PRP until after my visit."  Is Dr Tamala Julian able to do Lumbar PRP?  Please advise.

## 2022-05-18 NOTE — Telephone Encounter (Signed)
Sent patient MyChart message.

## 2022-06-03 DIAGNOSIS — M531 Cervicobrachial syndrome: Secondary | ICD-10-CM | POA: Diagnosis not present

## 2022-06-03 DIAGNOSIS — M9903 Segmental and somatic dysfunction of lumbar region: Secondary | ICD-10-CM | POA: Diagnosis not present

## 2022-06-03 DIAGNOSIS — M9902 Segmental and somatic dysfunction of thoracic region: Secondary | ICD-10-CM | POA: Diagnosis not present

## 2022-06-03 DIAGNOSIS — M9901 Segmental and somatic dysfunction of cervical region: Secondary | ICD-10-CM | POA: Diagnosis not present

## 2022-06-03 DIAGNOSIS — M791 Myalgia, unspecified site: Secondary | ICD-10-CM | POA: Diagnosis not present

## 2022-06-03 DIAGNOSIS — M9905 Segmental and somatic dysfunction of pelvic region: Secondary | ICD-10-CM | POA: Diagnosis not present

## 2022-06-14 ENCOUNTER — Ambulatory Visit: Payer: Managed Care, Other (non HMO) | Admitting: Family Medicine

## 2022-07-14 NOTE — Progress Notes (Deleted)
Patient ID: Shelley Norman, female    DOB: [birth year], 52 y.o.   MRN: [id number]   HPI F never smoker, followed for Allergic Rhinitis and Cough, complicated by osteoarthritis,  Had seen Dr Sherene Sires for upper airway cough> gabapentin, prilosec/pepcid, Singulair,   Has worked with Allergist.> ST Pos for grass, trees, ragweed, weed, mold, cat and dust mites. Negative to common foods.  =========================================================   10/13/21- 52 yoF never smoker, followed for Allergic Rhinitis and Cough, complicated by osteoarthritis,  Had seen Dr Sherene Sires for upper airway cough> gabapentin, prilosec/pepcid, Singulair,   Has worked with Allergist.> ST Pos for grass, trees, ragweed, weed, mold, cat and dust mites. Negative to common foods. > Breo 100, Ryaltris nasal spray, Ventolin hfa, - Trelegy 200, Singulair, Ventolin hfa, Promethazine-DM, chlorpheniramine,  Cough was previously a problem but she reports it has significantly resolved using Trelegy 200.  Aware of some rhinitis with postnasal drip.  07/15/22-  52 yoF never smoker, followed for Allergic Rhinitis and Cough, complicated by osteoarthritis,  Had seen Dr Sherene Sires for upper airway cough> gabapentin, prilosec/pepcid, Singulair,   Has worked with Allergist.> ST Pos for grass, trees, ragweed, weed, mold, cat and dust mites. Negative to common foods. > Breo 100, Ryaltris nasal spray, Ventolin hfa, - Trelegy 100, Singulair, Ventolin hfa, Promethazine-DM, chlorpheniramine,  LOV Walsh, NP 02/17/22   Review of Systems-See HPI Constitutional:   No-   weight loss, night sweats, fevers, chills, fatigue, lassitude. HEENT:   No-  headaches, difficulty swallowing, tooth/dental problems, sore throat,       No-  sneezing, itching, ear ache, +nasal congestion, post nasal drip,  CV:  No-   chest pain, orthopnea, PND, swelling in lower extremities, anasarca, dizziness, palpitations Resp: No-   shortness of breath with exertion or at rest.               No-   productive cough,  + non-productive cough,  No- coughing up of blood.              No-   change in color of mucus.  No- wheezing.   Skin: No-   rash or lesions. GI:  No-   heartburn, indigestion, abdominal pain, nausea, vomiting,  GU:  MS:  No-   joint pain or swelling.  . Neuro-     nothing unusual Psych:  No- change in mood or affect. No depression or anxiety.  No memory loss.    Objective:   Physical Exam General- Alert, Oriented, Affect-appropriate, Distress- none acute Skin- rash-none, lesions- none, excoriation- none Lymphadenopathy- none Head- atraumatic            Eyes- Gross vision intact, PERRLA, conjunctivae clear secretions            Ears- Hearing, canals-normal            Nose- Clear, no-Septal dev, mucus, polyps, erosion, perforation             Throat- Mallampati II , mucosa clear , drainage- none, tonsils- atrophic; torus Neck- flexible , trachea midline, no stridor , thyroid nl, carotid no bruit Chest - symmetrical excursion , unlabored           Heart/CV- RRR , no murmur , no gallop  , no rub, nl s1 s2           - JVD- none , edema- none, stasis changes- none, varices- none            Lung- clear to P&A,  wheeze- none, cough-none , dullness-none, rub- none           Chest wall-  Abd-  Br/ Gen/ Rectal- Not done, not indicated Extrem- cyanosis- none, clubbing, none, atrophy- none, strength- nl Neuro- grossly intact to observation

## 2022-07-15 ENCOUNTER — Ambulatory Visit: Payer: 59 | Admitting: Internal Medicine

## 2022-09-13 DIAGNOSIS — M9903 Segmental and somatic dysfunction of lumbar region: Secondary | ICD-10-CM | POA: Diagnosis not present

## 2022-09-13 DIAGNOSIS — M62830 Muscle spasm of back: Secondary | ICD-10-CM | POA: Diagnosis not present

## 2022-09-13 DIAGNOSIS — M9905 Segmental and somatic dysfunction of pelvic region: Secondary | ICD-10-CM | POA: Diagnosis not present

## 2022-09-13 DIAGNOSIS — M9902 Segmental and somatic dysfunction of thoracic region: Secondary | ICD-10-CM | POA: Diagnosis not present

## 2022-09-13 DIAGNOSIS — M9901 Segmental and somatic dysfunction of cervical region: Secondary | ICD-10-CM | POA: Diagnosis not present

## 2022-09-13 DIAGNOSIS — M5417 Radiculopathy, lumbosacral region: Secondary | ICD-10-CM | POA: Diagnosis not present

## 2022-10-06 DIAGNOSIS — H00022 Hordeolum internum right lower eyelid: Secondary | ICD-10-CM | POA: Diagnosis not present

## 2022-10-11 ENCOUNTER — Ambulatory Visit: Payer: 59 | Admitting: Internal Medicine

## 2022-10-13 DIAGNOSIS — M9901 Segmental and somatic dysfunction of cervical region: Secondary | ICD-10-CM | POA: Diagnosis not present

## 2022-10-13 DIAGNOSIS — M62830 Muscle spasm of back: Secondary | ICD-10-CM | POA: Diagnosis not present

## 2022-10-13 DIAGNOSIS — M9902 Segmental and somatic dysfunction of thoracic region: Secondary | ICD-10-CM | POA: Diagnosis not present

## 2022-10-13 DIAGNOSIS — M5417 Radiculopathy, lumbosacral region: Secondary | ICD-10-CM | POA: Diagnosis not present

## 2022-10-13 DIAGNOSIS — M9903 Segmental and somatic dysfunction of lumbar region: Secondary | ICD-10-CM | POA: Diagnosis not present

## 2022-10-13 DIAGNOSIS — M9905 Segmental and somatic dysfunction of pelvic region: Secondary | ICD-10-CM | POA: Diagnosis not present

## 2022-10-15 NOTE — Progress Notes (Signed)
Patient ID: Shelley Norman, female    DOB: 11/17/1970, 52 y.o.   MRN: [id number]   HPI F never smoker, followed for Allergic Rhinitis and Cough, complicated by osteoarthritis,  Had seen Dr Sherene Sires for upper airway cough> gabapentin, prilosec/pepcid, Singulair,   Has worked with Allergist.> ST Pos for grass, trees, ragweed, weed, mold, cat and dust mites. Negative to common foods.  =========================================================  10/13/21- 51 yoF never smoker, followed for Allergic Rhinitis and Cough, complicated by osteoarthritis,  Had seen Dr Sherene Sires for upper airway cough> gabapentin, prilosec/pepcid, Singulair,   Has worked with Allergist.> ST Pos for grass, trees, ragweed, weed, mold, cat and dust mites. Negative to common foods. > Breo 100, Ryaltris nasal spray, Ventolin hfa, - Trelegy 200, Singulair, Ventolin hfa, Promethazine-DM, chlorpheniramine,  Cough was previously a problem but she reports it has significantly resolved using Trelegy 200.  Aware of some rhinitis with postnasal drip.  10/18/22-  52 yoF never smoker, followed for Allergic Rhinitis, chronic Cough, complicated by osteoarthritis,  Had seen Dr Sherene Sires for upper airway cough> gabapentin, prilosec/pepcid, Singulair,   Has worked with Allergist.> ST Pos for grass, trees, ragweed, weed, mold, cat and dust mites. Negative to common foods. > Breo 100, Ryaltris nasal spray, Ventolin hfa, - Trelegy 200, Singulair, Ventolin hfa, Promethazine-DM, chlorpheniramine,  LOV Walsh, NP 02/17/22-continue Trelegy 100, singulair, claritin, flonase, Tussionex helps when needed for occasional nighttime cough.  Otherwise routine meds are holding.  Needs refills.  No acute issues.   Review of Systems-See HPI Constitutional:   No-   weight loss, night sweats, fevers, chills, fatigue, lassitude. HEENT:   No-  headaches, difficulty swallowing, tooth/dental problems, sore throat,       No-  sneezing, itching, ear ache, +nasal congestion,  post nasal drip,  CV:  No-   chest pain, orthopnea, PND, swelling in lower extremities, anasarca, dizziness, palpitations Resp: No-   shortness of breath with exertion or at rest.              No-   productive cough,  + non-productive cough,  No- coughing up of blood.              No-   change in color of mucus.  No- wheezing.   Skin: No-   rash or lesions. GI:  No-   heartburn, indigestion, abdominal pain, nausea, vomiting,  GU:  MS:  No-   joint pain or swelling.  . Neuro-     nothing unusual Psych:  No- change in mood or affect. No depression or anxiety.  No memory loss.    Objective:   Physical Exam General- Alert, Oriented, Affect-appropriate, Distress- none acute Skin- rash-none, lesions- none, excoriation- none Lymphadenopathy- none Head- atraumatic            Eyes- Gross vision intact, PERRLA, conjunctivae clear secretions            Ears- Hearing, canals-normal            Nose- Clear, no-Septal dev, mucus, polyps, erosion, perforation             Throat- Mallampati II , mucosa clear , drainage- none, tonsils- atrophic; torus Neck- flexible , trachea midline, no stridor , thyroid nl, carotid no bruit Chest - symmetrical excursion , unlabored           Heart/CV- RRR , no murmur , no gallop  , no rub, nl s1 s2           -  JVD- none , edema- none, stasis changes- none, varices- none            Lung- clear to P&A, wheeze- none, cough-none , dullness-none, rub- none           Chest wall-  Abd-  Br/ Gen/ Rectal- Not done, not indicated Extrem- cyanosis- none, clubbing, none, atrophy- none, strength- nl Neuro- grossly intact to observation

## 2022-10-18 ENCOUNTER — Encounter: Payer: Self-pay | Admitting: Internal Medicine

## 2022-10-18 ENCOUNTER — Ambulatory Visit: Payer: 59 | Admitting: Internal Medicine

## 2022-10-18 VITALS — BP 124/80 | HR 78 | Temp 98.2°F | Ht 61.75 in | Wt 161.8 lb

## 2022-10-18 DIAGNOSIS — J301 Allergic rhinitis due to pollen: Secondary | ICD-10-CM

## 2022-10-18 DIAGNOSIS — J4489 Other specified chronic obstructive pulmonary disease: Secondary | ICD-10-CM

## 2022-10-18 MED ORDER — HYDROCOD POLI-CHLORPHE POLI ER 10-8 MG/5ML PO SUER: 5.0000 mL | Freq: Every day | ORAL | 0 refills | Status: DC | PRN

## 2022-10-18 MED ORDER — TRELEGY ELLIPTA 100-62.5-25 MCG/ACT IN AEPB: INHALATION_SPRAY | RESPIRATORY_TRACT | 12 refills | Status: DC

## 2022-10-18 MED ORDER — TRELEGY ELLIPTA 100-62.5-25 MCG/ACT IN AEPB: 1.0000 | INHALATION_SPRAY | Freq: Every day | RESPIRATORY_TRACT | Status: DC

## 2022-10-18 NOTE — Patient Instructions (Signed)
Script sent for Trelegy 100  Script sent for Tussionex  Order sample Trelegy 100   inhale 1 puff then rinse mouth once daily

## 2022-10-19 DIAGNOSIS — M9901 Segmental and somatic dysfunction of cervical region: Secondary | ICD-10-CM | POA: Diagnosis not present

## 2022-10-19 DIAGNOSIS — M9902 Segmental and somatic dysfunction of thoracic region: Secondary | ICD-10-CM | POA: Diagnosis not present

## 2022-10-19 DIAGNOSIS — M5417 Radiculopathy, lumbosacral region: Secondary | ICD-10-CM | POA: Diagnosis not present

## 2022-10-19 DIAGNOSIS — M62830 Muscle spasm of back: Secondary | ICD-10-CM | POA: Diagnosis not present

## 2022-10-19 DIAGNOSIS — M9903 Segmental and somatic dysfunction of lumbar region: Secondary | ICD-10-CM | POA: Diagnosis not present

## 2022-10-19 DIAGNOSIS — M9905 Segmental and somatic dysfunction of pelvic region: Secondary | ICD-10-CM | POA: Diagnosis not present

## 2022-10-21 ENCOUNTER — Telehealth: Payer: Self-pay | Admitting: Internal Medicine

## 2022-10-21 MED ORDER — HYDROCOD POLI-CHLORPHE POLI ER 10-8 MG/5ML PO SUER: 5.0000 mL | Freq: Every day | ORAL | 0 refills | Status: DC | PRN

## 2022-10-21 NOTE — Telephone Encounter (Signed)
[facility] change T[facility]ex volume from 200 to 150 ml- done and resent.

## 2022-10-25 ENCOUNTER — Telehealth: Payer: Self-pay | Admitting: Nurse Practitioner

## 2022-10-25 NOTE — Telephone Encounter (Signed)
Pt is wanting to transfer her care from [place] to [facility]. she is needing appt's later in the day, because or her job. Please let me know and I will get back with her. [phone]

## 2022-10-25 NOTE — Telephone Encounter (Signed)
ok 

## 2022-10-26 ENCOUNTER — Telehealth: Payer: 59 | Admitting: Nurse Practitioner

## 2022-10-26 NOTE — Telephone Encounter (Signed)
LVMTCB

## 2022-10-27 ENCOUNTER — Encounter (INDEPENDENT_AMBULATORY_CARE_PROVIDER_SITE_OTHER): Payer: Self-pay

## 2022-11-10 DIAGNOSIS — M9903 Segmental and somatic dysfunction of lumbar region: Secondary | ICD-10-CM | POA: Diagnosis not present

## 2022-11-10 DIAGNOSIS — M9902 Segmental and somatic dysfunction of thoracic region: Secondary | ICD-10-CM | POA: Diagnosis not present

## 2022-11-10 DIAGNOSIS — M5417 Radiculopathy, lumbosacral region: Secondary | ICD-10-CM | POA: Diagnosis not present

## 2022-11-10 DIAGNOSIS — M62830 Muscle spasm of back: Secondary | ICD-10-CM | POA: Diagnosis not present

## 2022-11-10 DIAGNOSIS — M9901 Segmental and somatic dysfunction of cervical region: Secondary | ICD-10-CM | POA: Diagnosis not present

## 2022-11-10 DIAGNOSIS — M9905 Segmental and somatic dysfunction of pelvic region: Secondary | ICD-10-CM | POA: Diagnosis not present

## 2022-11-12 ENCOUNTER — Encounter: Payer: Self-pay | Admitting: Internal Medicine

## 2022-11-12 NOTE — Assessment & Plan Note (Signed)
Continue antihistamines and steroid nasal spray as needed.

## 2022-11-12 NOTE — Assessment & Plan Note (Signed)
This contributes to her chronic cough and Trelegy seems to have been helpful. Plan-refill Tussionex, Trelegy 100 with medication talk.  Continue sips of liquids, throat lozenges and conservative measures to reduce cough.

## 2022-11-18 ENCOUNTER — Encounter: Payer: Self-pay | Admitting: Nurse Practitioner

## 2022-11-18 ENCOUNTER — Encounter: Payer: Managed Care, Other (non HMO) | Admitting: Nurse Practitioner

## 2022-11-18 ENCOUNTER — Other Ambulatory Visit: Payer: Self-pay | Admitting: Internal Medicine

## 2022-11-19 ENCOUNTER — Encounter: Payer: 59 | Admitting: Nurse Practitioner

## 2022-11-19 MED ORDER — HYDROCOD POLI-CHLORPHE POLI ER 10-8 MG/5ML PO SUER: 5.0000 mL | Freq: Every day | ORAL | 0 refills | Status: DC | PRN

## 2022-11-19 NOTE — Telephone Encounter (Signed)
Tussionex  cough syrup refilled

## 2022-11-30 ENCOUNTER — Other Ambulatory Visit: Payer: Self-pay | Admitting: Internal Medicine

## 2022-11-30 DIAGNOSIS — R058 Other specified cough: Secondary | ICD-10-CM

## 2022-12-01 DIAGNOSIS — M9903 Segmental and somatic dysfunction of lumbar region: Secondary | ICD-10-CM | POA: Diagnosis not present

## 2022-12-01 DIAGNOSIS — M9902 Segmental and somatic dysfunction of thoracic region: Secondary | ICD-10-CM | POA: Diagnosis not present

## 2022-12-01 DIAGNOSIS — M62830 Muscle spasm of back: Secondary | ICD-10-CM | POA: Diagnosis not present

## 2022-12-01 DIAGNOSIS — M9905 Segmental and somatic dysfunction of pelvic region: Secondary | ICD-10-CM | POA: Diagnosis not present

## 2022-12-01 DIAGNOSIS — M5417 Radiculopathy, lumbosacral region: Secondary | ICD-10-CM | POA: Diagnosis not present

## 2022-12-01 DIAGNOSIS — M9901 Segmental and somatic dysfunction of cervical region: Secondary | ICD-10-CM | POA: Diagnosis not present

## 2022-12-06 ENCOUNTER — Other Ambulatory Visit: Payer: Self-pay | Admitting: Internal Medicine

## 2022-12-06 ENCOUNTER — Telehealth: Payer: Self-pay | Admitting: Internal Medicine

## 2022-12-06 MED ORDER — HYDROCOD POLI-CHLORPHE POLI ER 10-8 MG/5ML PO SUER: 5.0000 mL | Freq: Every day | ORAL | 0 refills | Status: DC | PRN

## 2022-12-06 NOTE — Telephone Encounter (Signed)
Patient sent mychart message to the front desk pool. She was wanting to be seen to be tested for asbestos exposure, but there are no appoinmtnets available with anyone (including apps) until the end of [place]. She denied to schedule. She is requesting refill of her tusonex and to have asbestos exposure testing ordered for her. Please advise.

## 2022-12-06 NOTE — Telephone Encounter (Signed)
Tussionex refilled 

## 2022-12-07 NOTE — Telephone Encounter (Signed)
Patient clarified that [facility].

## 2022-12-08 NOTE — Telephone Encounter (Signed)
LMOM to have patient call and let us know what symptoms she was having prior to forwarding to provider.

## 2022-12-09 NOTE — Telephone Encounter (Signed)
LMOM for patient to return call.

## 2022-12-13 ENCOUNTER — Encounter: Payer: Self-pay | Admitting: Internal Medicine

## 2022-12-15 ENCOUNTER — Encounter: Payer: 59 | Admitting: Family Medicine

## 2022-12-15 NOTE — Telephone Encounter (Signed)
There is not a blood tet for asbestos exposure. The closest we would have would be a high resolution CT scan (HRCT) of the chest, looking for changes that usually take decades after exposure to develop. There is a blood test for lead which can be ordered by your primary doctor if you are concerned about potential exposure.

## 2022-12-19 ENCOUNTER — Encounter: Payer: Self-pay | Admitting: Nurse Practitioner

## 2022-12-20 ENCOUNTER — Telehealth: Payer: Self-pay | Admitting: Nurse Practitioner

## 2022-12-20 ENCOUNTER — Other Ambulatory Visit: Payer: Self-pay | Admitting: Obstetrics and Gynecology

## 2022-12-20 ENCOUNTER — Encounter: Payer: 59 | Admitting: Nurse Practitioner

## 2022-12-20 DIAGNOSIS — Z1211 Encounter for screening for malignant neoplasm of colon: Secondary | ICD-10-CM | POA: Diagnosis not present

## 2022-12-20 DIAGNOSIS — Z01419 Encounter for gynecological examination (general) (routine) without abnormal findings: Secondary | ICD-10-CM | POA: Diagnosis not present

## 2022-12-20 DIAGNOSIS — Z1231 Encounter for screening mammogram for malignant neoplasm of breast: Secondary | ICD-10-CM

## 2022-12-20 DIAGNOSIS — Z139 Encounter for screening, unspecified: Secondary | ICD-10-CM | POA: Diagnosis not present

## 2022-12-20 DIAGNOSIS — Z1331 Encounter for screening for depression: Secondary | ICD-10-CM | POA: Diagnosis not present

## 2022-12-20 DIAGNOSIS — M170 Bilateral primary osteoarthritis of knee: Secondary | ICD-10-CM | POA: Diagnosis not present

## 2022-12-20 DIAGNOSIS — Z1239 Encounter for other screening for malignant neoplasm of breast: Secondary | ICD-10-CM | POA: Diagnosis not present

## 2022-12-20 DIAGNOSIS — Z124 Encounter for screening for malignant neoplasm of cervix: Secondary | ICD-10-CM | POA: Diagnosis not present

## 2022-12-20 DIAGNOSIS — N912 Amenorrhea, unspecified: Secondary | ICD-10-CM | POA: Diagnosis not present

## 2022-12-20 LAB — HM PAP SMEAR: HM Pap smear: NORMAL

## 2022-12-20 NOTE — Telephone Encounter (Signed)
Will discuss at visit

## 2022-12-20 NOTE — Telephone Encounter (Signed)
9.23.24 no show/ no show letter sent

## 2022-12-20 NOTE — Telephone Encounter (Signed)
Shelley Norman spoke to pt at approx appt time. She said she was stuck at another doctors office. Pt has been rescheduled for 10/14.

## 2022-12-21 NOTE — Telephone Encounter (Signed)
ERROR

## 2023-01-04 DIAGNOSIS — M9902 Segmental and somatic dysfunction of thoracic region: Secondary | ICD-10-CM | POA: Diagnosis not present

## 2023-01-04 DIAGNOSIS — M62830 Muscle spasm of back: Secondary | ICD-10-CM | POA: Diagnosis not present

## 2023-01-04 DIAGNOSIS — M9901 Segmental and somatic dysfunction of cervical region: Secondary | ICD-10-CM | POA: Diagnosis not present

## 2023-01-04 DIAGNOSIS — M9903 Segmental and somatic dysfunction of lumbar region: Secondary | ICD-10-CM | POA: Diagnosis not present

## 2023-01-04 DIAGNOSIS — M5417 Radiculopathy, lumbosacral region: Secondary | ICD-10-CM | POA: Diagnosis not present

## 2023-01-04 DIAGNOSIS — M9905 Segmental and somatic dysfunction of pelvic region: Secondary | ICD-10-CM | POA: Diagnosis not present

## 2023-01-10 ENCOUNTER — Ambulatory Visit (INDEPENDENT_AMBULATORY_CARE_PROVIDER_SITE_OTHER): Payer: 59 | Admitting: Nurse Practitioner

## 2023-01-10 ENCOUNTER — Ambulatory Visit
Admission: RE | Admit: 2023-01-10 | Discharge: 2023-01-10 | Disposition: A | Discharge status: Home / Self Care | Payer: 59 | Source: Ambulatory Visit | Attending: Obstetrics and Gynecology | Admitting: Obstetrics and Gynecology

## 2023-01-10 ENCOUNTER — Encounter: Payer: Self-pay | Admitting: Nurse Practitioner

## 2023-01-10 VITALS — BP 128/80 | HR 67 | Temp 97.5°F | Ht 61.75 in | Wt 156.6 lb

## 2023-01-10 DIAGNOSIS — E559 Vitamin D deficiency, unspecified: Secondary | ICD-10-CM | POA: Diagnosis not present

## 2023-01-10 DIAGNOSIS — Z23 Encounter for immunization: Secondary | ICD-10-CM

## 2023-01-10 DIAGNOSIS — Z1231 Encounter for screening mammogram for malignant neoplasm of breast: Secondary | ICD-10-CM

## 2023-01-10 DIAGNOSIS — Z0000 Encounter for general adult medical examination without abnormal findings: Secondary | ICD-10-CM

## 2023-01-10 DIAGNOSIS — M159 Polyosteoarthritis, unspecified: Secondary | ICD-10-CM

## 2023-01-10 DIAGNOSIS — E7800 Pure hypercholesterolemia, unspecified: Secondary | ICD-10-CM

## 2023-01-10 DIAGNOSIS — R053 Chronic cough: Secondary | ICD-10-CM | POA: Diagnosis not present

## 2023-01-10 DIAGNOSIS — E663 Overweight: Secondary | ICD-10-CM

## 2023-01-10 LAB — COMPREHENSIVE METABOLIC PANEL
ALT: 33 U/L (ref 0–35)
AST: 25 U/L (ref 0–37)
Albumin: 4.5 g/dL (ref 3.5–5.2)
Alkaline Phosphatase: 75 U/L (ref 39–117)
BUN: 10 mg/dL (ref 6–23)
CO2: 27 meq/L (ref 19–32)
Calcium: 9.6 mg/dL (ref 8.4–10.5)
Chloride: 103 meq/L (ref 96–112)
Creatinine, Ser: 0.92 mg/dL (ref 0.40–1.20)
GFR: 71.11 mL/min (ref >60.00)
Glucose, Bld: 89 mg/dL (ref 70–99)
Potassium: 3.9 meq/L (ref 3.5–5.1)
Sodium: 136 meq/L (ref 135–145)
Total Bilirubin: 0.6 mg/dL (ref 0.2–1.2)
Total Protein: 7.3 g/dL (ref 6.0–8.3)

## 2023-01-10 LAB — CBC WITH DIFFERENTIAL/PLATELET
Basophils Absolute: 0 10*3/uL (ref 0.0–0.1)
Basophils Relative: 0.5 % (ref 0.0–3.0)
Eosinophils Absolute: 0 10*3/uL (ref 0.0–0.7)
Eosinophils Relative: 0.9 % (ref 0.0–5.0)
HCT: 37.6 % (ref 36.0–46.0)
Hemoglobin: 12.2 g/dL (ref 12.0–15.0)
Lymphocytes Relative: 44.3 % (ref 12.0–46.0)
Lymphs Abs: 1.7 10*3/uL (ref 0.7–4.0)
MCHC: 32.5 g/dL (ref 30.0–36.0)
MCV: 96.7 fL (ref 78.0–100.0)
Monocytes Absolute: 0.2 10*3/uL (ref 0.1–1.0)
Monocytes Relative: 4.7 % (ref 3.0–12.0)
Neutro Abs: 1.9 10*3/uL (ref 1.4–7.7)
Neutrophils Relative %: 49.6 % (ref 43.0–77.0)
Platelets: 298 10*3/uL (ref 150.0–400.0)
RBC: 3.89 Mil/uL (ref 3.87–5.11)
RDW: 13.2 % (ref 11.5–15.5)
WBC: 3.9 10*3/uL — ABNORMAL LOW (ref 4.0–10.5)

## 2023-01-10 LAB — LIPID PANEL
Cholesterol: 193 mg/dL (ref 0–200)
HDL: 70.2 mg/dL (ref >39.00)
LDL Cholesterol: 102 mg/dL — ABNORMAL HIGH (ref 0–99)
NonHDL: 122.89
Total CHOL/HDL Ratio: 3
Triglycerides: 102 mg/dL (ref 0.0–149.0)
VLDL: 20.4 mg/dL (ref 0.0–40.0)

## 2023-01-10 LAB — VITAMIN D 25 HYDROXY (VIT D DEFICIENCY, FRACTURES): VITD: 28.97 ng/mL — ABNORMAL LOW (ref 30.00–100.00)

## 2023-01-10 NOTE — Assessment & Plan Note (Signed)
Chronic, stable.  She states that she is following with Dr. Maple Hudson with pulmonology.  She takes Trelegy inhaler as needed along with albuterol inhaler as needed.  She will also take loratadine 10 mg daily and montelukast 10 mg daily.  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  She was given Intestinex which did help calm down the cough.  She is interested in following up with an x-ray and testing for lead with the construction that happened.  Orders placed today.

## 2023-01-10 NOTE — Assessment & Plan Note (Signed)
BMI 28.8.  Information given on nutrition and exercise.

## 2023-01-10 NOTE — Patient Instructions (Signed)
It was great to see you!  We are checking your labs today and will let you know the results via mychart/phone.   I have ordered a chest xray, they will call to schedule.   Let's follow-up in 1 year, sooner if you have concerns.  If a referral was placed today, you will be contacted for an appointment. Please note that routine referrals can sometimes take up to 3-4 weeks to process. Please call our office if you haven't heard anything after this time frame.  Take care,  Rodman Pickle, NP

## 2023-01-10 NOTE — Progress Notes (Signed)
New Patient Visit  BP 128/80   Pulse 67   Temp (!) 97.5 F (36.4 C)   Ht 5' 1.75" (1.568 m)   Wt 156 lb 9.6 oz (71 kg)   LMP 11/25/2020 (Approximate)   SpO2 97%   BMI 28.87 kg/m    Subjective:    Patient ID: Shelley Norman, female    DOB: [birth year], 52 y.o.   MRN: [id number]  CC: Chief Complaint  Patient presents with  . Transfer of Care    Flu and Shingles Vaccine, lab work and lead test    HPI: Shelley Norman is a 51 y.o. female presents for new patient visit to establish care.  Introduced to Publishing rights manager role and practice setting.  All questions answered.  Discussed provider/patient relationship and expectations.  She has a history of arthritis in her feet, knees, and back. She gets routine non-steroid injections routinely from [facility]. She also had a PRP injection in her back which significantly helped   She has a history of chronic cough. She follows with pulmonology. She takes singulair daily and has inhalers that she uses as needed. She also had a flare up of symptoms with recent construction in her building. She reached out to her pulmonologist and discussed a chest xray with him. She would also like to be tested for lead.   Depression and Anxiety Screen Done:     01/10/2023    2:59 PM 11/07/2020    1:44 PM 03/23/2016    3:42 PM 01/10/2013    9:51 AM  Depression screen PHQ 2/9  Decreased Interest 1 0 0 0  Down, Depressed, Hopeless 2 0 0 0  PHQ - 2 Score 3 0 0 0  Altered sleeping 1 0    Tired, decreased energy 1 2    Change in appetite 0 0    Feeling bad or failure about yourself  1 0    Trouble concentrating 0 0    Moving slowly or fidgety/restless 0 0    Suicidal thoughts 0 0    PHQ-9 Score 6 2    Difficult doing work/chores Somewhat difficult Not difficult at all        01/10/2023    2:59 PM 11/07/2020    1:44 PM  GAD 7 : Generalized Anxiety Score  Nervous, Anxious, on Edge 2 0  Control/stop worrying 2 0  Worry too much - different  things 2 0  Trouble relaxing 1 1  Restless 0 0  Easily annoyed or irritable 2 0  Afraid - awful might happen 1 0  Total GAD 7 Score 10 1  Anxiety Difficulty Somewhat difficult Not difficult at all    Past Medical History:  Diagnosis Date  . Allergic rhinitis   . Bacterial infection   . Dense breast tissue 11/07/2020  . Fibroids   . Mastodynia   . Osteoarthritis    feet, knees, lower back - no meds  . PONV (postoperative nausea and vomiting)   . Vaginismus   . Vitamin D deficiency   . Yeast infection     Past Surgical History:  Procedure Laterality Date  . FOOT SURGERY     bilateral 2 right and 2 left foot surgeries - totatl 4  . HAMMER TOE SURGERY  2007   left foot - toe next to pinky  . MYOMECTOMY N/A 01/08/2014   Procedure: Abdominal MYOMECTOMY;  Surgeon: Hal Morales, MD;  Location: [facility];  Service: Gynecology;  Laterality: N/A;  .  WISDOM TOOTH EXTRACTION      Family History  Problem Relation Age of Onset  . Hypertension Mother   . Hyperlipidemia Mother   . Arthritis Mother   . Diabetes Father   . Allergic rhinitis Sister   . Breast cancer Sister 78  . Breast cancer Sister 73  . Osteoporosis Other   . Colon cancer Neg Hx   . Colon polyps Neg Hx   . Esophageal cancer Neg Hx   . Stomach cancer Neg Hx   . Rectal cancer Neg Hx      Social History   Tobacco Use  . Smoking status: Never    Passive exposure: Never  . Smokeless tobacco: Never  Vaping Use  . Vaping status: Never Used  Substance Use Topics  . Alcohol use: Yes    Alcohol/week: 1.0 standard drink of alcohol    Types: 1 Glasses of wine per week    Comment: socially  . Drug use: No    Current Outpatient Medications on File Prior to Visit  Medication Sig Dispense Refill  . albuterol (VENTOLIN HFA) 108 (90 Base) MCG/ACT inhaler Inhale 2 puffs into the lungs every 4 (four) hours as needed for wheezing or shortness of breath (coughing fits). 18 g 1  . Bee Pollen 1000 MG TABS Take 1 tablet  by mouth daily.      . Biotin 2.5 MG TABS Take 1 tablet by mouth daily.      . chlorpheniramine (CHLOR-TRIMETON) 4 MG tablet Take 4 mg by mouth daily at 12 noon.    . chlorpheniramine-HYDROcodone (TUSSIONEX) 10-8 MG/5ML Take 5 mLs by mouth daily as needed. 150 mL 0  . Cholecalciferol (VITAMIN D3) 2000 UNITS TABS Take 1 tablet by mouth daily.      . Fluticasone-Umeclidin-Vilant (TRELEGY ELLIPTA) 100-62.5-25 MCG/ACT AEPB Inhale 1 puff then rinse mouth, once daily 60 each 12  . Fluticasone-Umeclidin-Vilant (TRELEGY ELLIPTA) 100-62.5-25 MCG/ACT AEPB Inhale 1 puff into the lungs daily.    Marland Kitchen loratadine (CLARITIN) 10 MG tablet Take 10 mg by mouth daily as needed for allergies.     . Magnesium 250 MG TABS Take 1 tablet by mouth daily.      . Misc Natural Products (OSTEO BI-FLEX ADV DOUBLE ST PO) Take 1 tablet by mouth 2 (two) times daily.      . montelukast (SINGULAIR) 10 MG tablet TAKE 1 TABLET BY MOUTH AT BEDTIME 30 tablet 6  . norethindrone-ethinyl estradiol (OVCON-35) 0.4-35 MG-MCG tablet Vyfemla (28) 0.4 mg-35 mcg tablet    . tretinoin (RETIN-A) 0.025 % cream Apply to back nightly    . VOLTAREN 1 % GEL Apply 2 g topically 2 (two) times daily as needed (arthritis pain in knee).     Marland Kitchen ibuprofen (ADVIL,MOTRIN) 600 MG tablet 1  po  pc every 6 hours for 5 days then prn-pain (Patient not taking: Reported on 01/10/2023) 30 tablet 1   No current facility-administered medications on file prior to visit.     Review of Systems  HENT:  Positive for rhinorrhea. Negative for congestion.   Respiratory:  Positive for cough. Negative for shortness of breath.   Cardiovascular: Negative.   Gastrointestinal: Negative.   Genitourinary: Negative.   Musculoskeletal:  Positive for arthralgias.  Skin: Negative.   Allergic/Immunologic: Positive for environmental allergies.  Neurological: Negative.   Psychiatric/Behavioral:  The patient is nervous/anxious.       Objective:    BP 128/80   Pulse 67   Temp (!)  97.5 F (36.4  C)   Ht 5' 1.75" (1.568 m)   Wt 156 lb 9.6 oz (71 kg)   LMP 11/25/2020 (Approximate)   SpO2 97%   BMI 28.87 kg/m   Wt Readings from Last 3 Encounters:  01/10/23 156 lb 9.6 oz (71 kg)  10/18/22 161 lb 12.8 oz (73.4 kg)  02/17/22 164 lb 12.8 oz (74.8 kg)    BP Readings from Last 3 Encounters:  01/10/23 128/80  10/18/22 124/80  02/17/22 110/84    Physical Exam Vitals and nursing note reviewed.  Constitutional:      General: She is not in acute distress.    Appearance: Normal appearance.  HENT:     Head: Normocephalic and atraumatic.     Right Ear: Tympanic membrane, ear canal and external ear normal.     Left Ear: Tympanic membrane, ear canal and external ear normal.     Mouth/Throat:     Mouth: Mucous membranes are moist.     Pharynx: No posterior oropharyngeal erythema.  Eyes:     Conjunctiva/sclera: Conjunctivae normal.  Cardiovascular:     Rate and Rhythm: Normal rate and regular rhythm.     Pulses: Normal pulses.     Heart sounds: Normal heart sounds.  Pulmonary:     Effort: Pulmonary effort is normal.     Breath sounds: Normal breath sounds.  Abdominal:     Palpations: Abdomen is soft.     Tenderness: There is no abdominal tenderness.  Musculoskeletal:        General: Normal range of motion.     Cervical back: Normal range of motion and neck supple.     Right lower leg: No edema.     Left lower leg: No edema.     Comments: Strength 5/5 in bilateral upper and lower extremities   Lymphadenopathy:     Cervical: No cervical adenopathy.  Skin:    General: Skin is warm and dry.  Neurological:     General: No focal deficit present.     Mental Status: She is alert and oriented to person, place, and time.     Cranial Nerves: No cranial nerve deficit.     Coordination: Coordination normal.     Gait: Gait normal.  Psychiatric:        Mood and Affect: Mood normal.        Behavior: Behavior normal.        Thought Content: Thought content normal.         Judgment: Judgment normal.       Assessment & Plan:   Problem List Items Addressed This Visit       Musculoskeletal and Integument   Osteoarthritis    Chronic, stable.  She is currently following with orthopedics.  She gets injections in her knees regularly and she had PRP injections in her back.  Continue collaboration recommendations from specialist.        Other   Vitamin D deficiency    She is currently taking an over-the-counter vitamin D supplement daily.  Will check vitamin D levels and adjust regimen based on results.      Relevant Orders   VITAMIN D 25 Hydroxy (Vit-D Deficiency, Fractures)   Overweight    BMI 28.8.  Information given on nutrition and exercise.      Chronic coughing    Chronic, stable.  She states that she is following with Dr. Maple Hudson with pulmonology.  She takes Trelegy inhaler as needed along with albuterol inhaler as needed.  She  will also take loratadine 10 mg daily and montelukast 10 mg daily.  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  She was given Intestinex which did help calm down the cough.  She is interested in following up with an x-ray and testing for lead with the construction that happened.  Orders placed today.      Relevant Orders   Lead, blood (adult age 67 yrs or greater)   DG Chest 2 View   Pure hypercholesterolemia    Chronic, stable.  Last LDL was 120.  ASCVD score currently 6.1%.  Will update CMP, CBC, lipid panel today.       Relevant Orders   CBC with Differential/Platelet   Comprehensive metabolic panel   Lipid panel   Routine general medical examination at a health care facility - Primary    Health maintenance reviewed and updated. Discussed nutrition, exercise. Follow-up 1 year.        Other Visit Diagnoses     Immunization due       Flu vaccine given today   Relevant Orders   Flu vaccine trivalent PF, 6mos and older(Flulaval,Afluria,Fluarix,Fluzone) (Completed)        LABORATORY TESTING:  - Pap smear: up to date  IMMUNIZATIONS:   - Tdap: Tetanus vaccination status reviewed: last tetanus booster within 10 years. - Influenza: Administered today - Pneumovax: Up to date - Prevnar: Up to date - HPV: Not applicable - Shingrix vaccine: Up to date  SCREENING: -Mammogram:  scheduled for today   - Colonoscopy: Up to date  - Bone Density: Not applicable  -Hearing Test: Not applicable  -Spirometry: Not applicable   PATIENT COUNSELING:   Advised to take 1 mg of folate supplement per day if capable of pregnancy.   Sexuality: Discussed sexually transmitted diseases, partner selection, use of condoms, avoidance of unintended pregnancy  and contraceptive alternatives.   Advised to avoid cigarette smoking.  I discussed with the patient that most people either abstain from alcohol or drink within safe limits (<=14/week and <=4 drinks/occasion for males, <=7/weeks and <= 3 drinks/occasion for females) and that the risk for alcohol disorders and other health effects rises proportionally with the number of drinks per week and how often a drinker exceeds daily limits.  Discussed cessation/primary prevention of drug use and availability of treatment for abuse.   Diet: Encouraged to adjust caloric intake to maintain  or achieve ideal body weight, to reduce intake of dietary saturated fat and total fat, to limit sodium intake by avoiding high sodium foods and not adding table salt, and to maintain adequate dietary potassium and calcium preferably from fresh fruits, vegetables, and low-fat dairy products.    stressed the importance of regular exercise  Injury prevention: Discussed safety belts, safety helmets, smoke detector, smoking near bedding or upholstery.   Dental health: Discussed importance of regular tooth brushing, flossing, and dental visits.    NEXT PREVENTATIVE PHYSICAL DUE IN 1 YEAR.   Follow up plan: Return in about 1 year (around 01/10/2024)  for CPE.  Jaclene Bartelt A Canisha Issac

## 2023-01-10 NOTE — Assessment & Plan Note (Signed)
Chronic, stable.  Last LDL was 120.  ASCVD score currently 6.1%.  Will update CMP, CBC, lipid panel today.

## 2023-01-10 NOTE — Assessment & Plan Note (Signed)
Chronic, stable.  She is currently following with orthopedics.  She gets injections in her knees regularly and she had PRP injections in her back.  Continue collaboration recommendations from specialist.

## 2023-01-10 NOTE — Assessment & Plan Note (Signed)
Health maintenance reviewed and updated. Discussed nutrition, exercise. Follow-up 1 year.

## 2023-01-10 NOTE — Assessment & Plan Note (Signed)
She is currently taking an over the counter vitamin D supplement daily. Will check vitamin D levels and adjust regimen based on results.

## 2023-01-11 ENCOUNTER — Encounter: Payer: Self-pay | Admitting: Nurse Practitioner

## 2023-01-13 LAB — LEAD, BLOOD (ADULT >= 16 YRS)

## 2023-01-13 LAB — EXTRA SPECIMEN

## 2023-01-18 DIAGNOSIS — M9901 Segmental and somatic dysfunction of cervical region: Secondary | ICD-10-CM | POA: Diagnosis not present

## 2023-01-18 DIAGNOSIS — M9905 Segmental and somatic dysfunction of pelvic region: Secondary | ICD-10-CM | POA: Diagnosis not present

## 2023-01-18 DIAGNOSIS — M62830 Muscle spasm of back: Secondary | ICD-10-CM | POA: Diagnosis not present

## 2023-01-18 DIAGNOSIS — M9902 Segmental and somatic dysfunction of thoracic region: Secondary | ICD-10-CM | POA: Diagnosis not present

## 2023-01-18 DIAGNOSIS — M9903 Segmental and somatic dysfunction of lumbar region: Secondary | ICD-10-CM | POA: Diagnosis not present

## 2023-01-18 DIAGNOSIS — M5417 Radiculopathy, lumbosacral region: Secondary | ICD-10-CM | POA: Diagnosis not present

## 2023-01-31 DIAGNOSIS — M9902 Segmental and somatic dysfunction of thoracic region: Secondary | ICD-10-CM | POA: Diagnosis not present

## 2023-01-31 DIAGNOSIS — M9901 Segmental and somatic dysfunction of cervical region: Secondary | ICD-10-CM | POA: Diagnosis not present

## 2023-01-31 DIAGNOSIS — M9905 Segmental and somatic dysfunction of pelvic region: Secondary | ICD-10-CM | POA: Diagnosis not present

## 2023-01-31 DIAGNOSIS — M5417 Radiculopathy, lumbosacral region: Secondary | ICD-10-CM | POA: Diagnosis not present

## 2023-01-31 DIAGNOSIS — M9903 Segmental and somatic dysfunction of lumbar region: Secondary | ICD-10-CM | POA: Diagnosis not present

## 2023-01-31 DIAGNOSIS — M62830 Muscle spasm of back: Secondary | ICD-10-CM | POA: Diagnosis not present

## 2023-02-04 ENCOUNTER — Other Ambulatory Visit: Payer: Self-pay

## 2023-02-04 ENCOUNTER — Other Ambulatory Visit: Payer: 59

## 2023-02-04 ENCOUNTER — Telehealth: Payer: Self-pay

## 2023-02-04 DIAGNOSIS — Z0000 Encounter for general adult medical examination without abnormal findings: Secondary | ICD-10-CM

## 2023-02-04 DIAGNOSIS — R053 Chronic cough: Secondary | ICD-10-CM

## 2023-02-04 NOTE — Telephone Encounter (Signed)
Patient was in lab today and questioned about her x-ray that was ordered on 01-10-23.

## 2023-02-04 NOTE — Telephone Encounter (Signed)
Patient wants to have order changed to go to [facility] for Imaging.

## 2023-02-04 NOTE — Telephone Encounter (Signed)
Order was sent to [facility] and patient will call to schedule an appointment for her x-ray.

## 2023-02-04 NOTE — Telephone Encounter (Signed)
I called and spoke with patient and she said that she needs something on a Saturday.

## 2023-02-04 NOTE — Addendum Note (Signed)
Addended by: Rodman Pickle A on: 02/04/2023 04:46 PM   Modules accepted: Orders

## 2023-02-08 LAB — LEAD, BLOOD (ADULT >= 16 YRS): Lead: <1 ug/dL (ref <3.5)

## 2023-02-09 ENCOUNTER — Encounter: Payer: Self-pay | Admitting: Nurse Practitioner

## 2023-02-09 DIAGNOSIS — R053 Chronic cough: Secondary | ICD-10-CM

## 2023-02-10 NOTE — Telephone Encounter (Signed)
PT can go to any [facility] location on a Saturday. I called [facility] and they said she can just come thru emergency let them know she is there for an outpatient xray and that the order is in the system and they will take care of her.  They will do it 24-7 no appt needed...  Provider made aware and  detailed message was left on pt vm

## 2023-03-01 ENCOUNTER — Telehealth: Payer: Self-pay | Admitting: Internal Medicine

## 2023-03-01 NOTE — Telephone Encounter (Signed)
Patient needs refill of tussionex for her cold and cough.  [facility] [address]

## 2023-03-03 ENCOUNTER — Encounter: Payer: Self-pay | Admitting: Internal Medicine

## 2023-03-04 MED ORDER — HYDROCOD POLI-CHLORPHE POLI ER 10-8 MG/5ML PO SUER: 5.0000 mL | Freq: Every day | ORAL | 0 refills | Status: DC | PRN

## 2023-03-04 NOTE — Telephone Encounter (Signed)
Tussionex refilled. This can be habit forming. Use sparingly.

## 2023-03-09 NOTE — Telephone Encounter (Signed)
Tussionex refilled on 12/6.

## 2023-03-15 DIAGNOSIS — M9905 Segmental and somatic dysfunction of pelvic region: Secondary | ICD-10-CM | POA: Diagnosis not present

## 2023-03-15 DIAGNOSIS — M9901 Segmental and somatic dysfunction of cervical region: Secondary | ICD-10-CM | POA: Diagnosis not present

## 2023-03-15 DIAGNOSIS — M62830 Muscle spasm of back: Secondary | ICD-10-CM | POA: Diagnosis not present

## 2023-03-15 DIAGNOSIS — M9903 Segmental and somatic dysfunction of lumbar region: Secondary | ICD-10-CM | POA: Diagnosis not present

## 2023-03-15 DIAGNOSIS — M5417 Radiculopathy, lumbosacral region: Secondary | ICD-10-CM | POA: Diagnosis not present

## 2023-03-15 DIAGNOSIS — M9902 Segmental and somatic dysfunction of thoracic region: Secondary | ICD-10-CM | POA: Diagnosis not present

## 2023-03-15 NOTE — Telephone Encounter (Signed)
Patient needs prescription Tussionex  [facility] [address] in [place]. She would also like a full bottle if possible. Patient has never used [facility]. Please call once filled [phone]

## 2023-03-16 ENCOUNTER — Other Ambulatory Visit: Payer: Self-pay | Admitting: Obstetrics and Gynecology

## 2023-03-16 DIAGNOSIS — Z9189 Other specified personal risk factors, not elsewhere classified: Secondary | ICD-10-CM

## 2023-03-16 NOTE — Telephone Encounter (Signed)
Dr. Maple Hudson it looks like the prescription was sent to the wrong pharmacy on 12/6. Can you send [facility] into [facility]? Thank you!

## 2023-03-17 MED ORDER — HYDROCOD POLI-CHLORPHE POLI ER 10-8 MG/5ML PO SUER: 5.0000 mL | Freq: Every day | ORAL | 0 refills | Status: DC | PRN

## 2023-03-17 NOTE — Telephone Encounter (Signed)
I have sent tussionex script to [facility]

## 2023-03-18 ENCOUNTER — Encounter: Payer: Self-pay | Admitting: Obstetrics and Gynecology

## 2023-03-21 ENCOUNTER — Encounter: Payer: Self-pay | Admitting: Obstetrics and Gynecology

## 2023-03-24 ENCOUNTER — Encounter: Payer: Self-pay | Admitting: Obstetrics and Gynecology

## 2023-03-25 ENCOUNTER — Ambulatory Visit: Payer: 59

## 2023-03-25 ENCOUNTER — Encounter: Payer: Self-pay | Admitting: Obstetrics and Gynecology

## 2023-03-28 ENCOUNTER — Inpatient Hospital Stay
Admission: RE | Admit: 2023-03-28 | Discharge: 2023-03-28 | Disposition: A | Discharge status: Home / Self Care | Payer: 59 | Source: Ambulatory Visit | Attending: Obstetrics and Gynecology | Admitting: Obstetrics and Gynecology

## 2023-03-29 DIAGNOSIS — M9903 Segmental and somatic dysfunction of lumbar region: Secondary | ICD-10-CM | POA: Diagnosis not present

## 2023-03-29 DIAGNOSIS — M62830 Muscle spasm of back: Secondary | ICD-10-CM | POA: Diagnosis not present

## 2023-03-29 DIAGNOSIS — M5417 Radiculopathy, lumbosacral region: Secondary | ICD-10-CM | POA: Diagnosis not present

## 2023-03-29 DIAGNOSIS — M9902 Segmental and somatic dysfunction of thoracic region: Secondary | ICD-10-CM | POA: Diagnosis not present

## 2023-03-29 DIAGNOSIS — M9901 Segmental and somatic dysfunction of cervical region: Secondary | ICD-10-CM | POA: Diagnosis not present

## 2023-03-29 DIAGNOSIS — M9905 Segmental and somatic dysfunction of pelvic region: Secondary | ICD-10-CM | POA: Diagnosis not present

## 2023-03-31 ENCOUNTER — Other Ambulatory Visit: Payer: Self-pay | Admitting: Obstetrics and Gynecology

## 2023-03-31 DIAGNOSIS — Z803 Family history of malignant neoplasm of breast: Secondary | ICD-10-CM

## 2023-04-12 ENCOUNTER — Encounter: Payer: Self-pay | Admitting: Nurse Practitioner

## 2023-04-12 DIAGNOSIS — R053 Chronic cough: Secondary | ICD-10-CM

## 2023-04-12 DIAGNOSIS — M5416 Radiculopathy, lumbar region: Secondary | ICD-10-CM

## 2023-04-14 ENCOUNTER — Ambulatory Visit: Payer: Self-pay | Admitting: Nurse Practitioner

## 2023-04-14 NOTE — Telephone Encounter (Signed)
  Chief Complaint: Possible UTI Symptoms: pressure when urinating, delayed stream, odor Frequency: approx 1 month Pertinent Negatives: Patient denies pain, blood in urine, fever Disposition: [] ED /[] Urgent Care (no appt availability in office) / [x] Appointment(In office/virtual)/ []  [place] [facility]/ [] Home Care/ [] Refused Recommended Disposition /[] [place] [facility]/ []  Follow-up with PCP Additional Notes: Patient called reporting urinary symptoms of pressure and delayed stream. Symptoms present for approx 1 month, states she has tried at home treatments with no relief Denies fever, blood in urine. Per protocol, patient to be evaluated within 24 hours. Scheduled with first available appt per patient request. Care advice reviewed with patient, understanding verbalized. Denies further questions at this time. Alerting PCP for review.    Copied from CRM [id number]. Topic: Clinical - Red Word Triage >> Apr 14, 2023  2:50 PM Florestine Avers wrote: Red Word that prompted transfer to Nurse Triage: Patient called stating that she has pressure when she urinates, and she may have a possible UTI. Reason for Disposition  Bad or foul-smelling urine  Answer Assessment - Initial Assessment Questions 1. SYMPTOM: "What's the main symptom you're concerned about?" (e.g., frequency, incontinence)     Pressure, delayed stream, odor to urine 2. ONSET: "When did the  pressure  start?"     1 month 3. PAIN: "Is there any pain?" If Yes, ask: "How bad is it?" (Scale: 1-10; mild, moderate, severe)     No pain 4. CAUSE: "What do you think is causing the symptoms?"     UTI, similar to the past 5. OTHER SYMPTOMS: "Do you have any other symptoms?" (e.g., blood in urine, fever, flank pain, pain with urination)     Denies  Protocols used: Urinary Symptoms-A-AH

## 2023-04-14 NOTE — Telephone Encounter (Signed)
Noted  

## 2023-04-15 ENCOUNTER — Ambulatory Visit: Payer: Medicaid Other | Admitting: Family Medicine

## 2023-04-15 ENCOUNTER — Encounter: Payer: Self-pay | Admitting: Family Medicine

## 2023-04-15 VITALS — BP 110/70 | HR 98 | Temp 98.7°F | Ht 61.75 in | Wt 155.8 lb

## 2023-04-15 DIAGNOSIS — F339 Major depressive disorder, recurrent, unspecified: Secondary | ICD-10-CM | POA: Diagnosis not present

## 2023-04-15 DIAGNOSIS — R319 Hematuria, unspecified: Secondary | ICD-10-CM | POA: Diagnosis not present

## 2023-04-15 MED ORDER — NITROFURANTOIN MONOHYD MACRO 100 MG PO CAPS: 100.0000 mg | ORAL_CAPSULE | Freq: Two times a day (BID) | ORAL | 0 refills | Status: DC

## 2023-04-15 NOTE — Assessment & Plan Note (Addendum)
The patient reports experiencing depressive symptoms more than half the days, associated with significant stress due to recent job loss on March 01, 2023. Symptoms include low mood and increased stress. The patient expresses interest in therapy and declines pharmacological intervention at this time.  Plan:  Provide a referral to a mental health therapist for counseling services. Encourage engagement in stress-reducing activities and social support systems. Patient advised to contact the clinic if depressive symptoms worsen or if additional support is needed.

## 2023-04-15 NOTE — Telephone Encounter (Signed)
I called and spoke with patient and notified her that order was faxed to fax number provided for xray. I also asked patient her chiropractor and she goes to Dr. Alben Spittle at [facility] at [address] [place], [place], [place] [address].  [phone]

## 2023-04-15 NOTE — Telephone Encounter (Signed)
I called and spoke with receptionist from member services and Dr. Alben Spittle is not in net work for patient.

## 2023-04-15 NOTE — Assessment & Plan Note (Addendum)
Patient presents with dysuria.  UA shows hematuria without leukocytes or nitrites  Differential Diagnosis:  Urinary tract infection Nephrolithiasis Urinary tract malignancy Glomerulonephritis  Plan: Initiate nitrofurantoin (Macrobid) 100?mg orally twice daily for 7 days. Send urine for culture and sensitivity testing. Advise patient to monitor for worsening symptoms, including nausea, vomiting, fever, chills, increased hematuria, or back pain. If urine culture is negative, consider further evaluation for hematuria (e.g., imaging studies, urology referral).

## 2023-04-15 NOTE — Progress Notes (Signed)
Assessment/Plan:   Problem List Items Addressed This Visit       Other   Hematuria - Primary   Patient presents with dysuria.  UA shows hematuria without leukocytes or nitrites  Differential Diagnosis:  Urinary tract infection Nephrolithiasis Urinary tract malignancy Glomerulonephritis  Plan: Initiate nitrofurantoin (Macrobid) 100?mg orally twice daily for 7 days. Send urine for culture and sensitivity testing. Advise patient to monitor for worsening symptoms, including nausea, vomiting, fever, chills, increased hematuria, or back pain. If urine culture is negative, consider further evaluation for hematuria (e.g., imaging studies, urology referral).      Relevant Medications   nitrofurantoin, macrocrystal-monohydrate, (MACROBID) 100 MG capsule   Other Relevant Orders   Urine Culture   Depression, recurrent (HCC)   The patient reports experiencing depressive symptoms more than half the days, associated with significant stress due to recent job loss on March 01, 2023. Symptoms include low mood and increased stress. The patient expresses interest in therapy and declines pharmacological intervention at this time.  Plan:  Provide a referral to a mental health therapist for counseling services. Encourage engagement in stress-reducing activities and social support systems. Patient advised to contact the clinic if depressive symptoms worsen or if additional support is needed.      Relevant Orders   Ambulatory referral to Behavioral Health    There are no discontinued medications.  Return if symptoms worsen or fail to improve.    Subjective:   Encounter date: 04/15/2023  Shelley Norman is a 53 y.o. female who has Vitamin D deficiency; Overweight; Allergic rhinitis due to pollen; Osteoarthritis; Bunion, left foot; FOOT SURGERY, HX OF; Chronic coughing; Mastodynia; Vaginospasm; Uterine leiomyoma; Dense breasts; Mouth ulcers; Upper airway cough syndrome; Lateral  epicondylitis of right elbow; Osteoarthritis of left knee; Menorrhagia; Fibrocystic disease of breast; Asthmatic bronchitis , chronic (HCC); Pure hypercholesterolemia; Routine general medical examination at a health care facility; Hematuria; and Depression, recurrent (HCC) on their problem list..   She  has a past medical history of Allergic rhinitis, Bacterial infection, Dense breast tissue (11/07/2020), Fibroids, Mastodynia, Osteoarthritis, PONV (postoperative nausea and vomiting), Vaginismus, Vitamin D deficiency, and Yeast infection..   Chief Complaint: Urinary pressure during urination.  History of Present Illness: The patient is  presenting with a one-month history of intermittent urinary pressure during urination without burning sensation. Symptoms are not worsening but persist despite over-the-counter interventions. The patient self-treated with Uqora without significant relief and notes that AZO has been more effective in the past. There is no history of frequent UTIs; previous episodes occurred twice, with the last in 2015 following a myomectomy with catheterization. The patient denies vaginal bleeding, discharge, or discomfort. Chronic back pain is reported due to known back issues, with no change in intensity or character. The patient denies nausea, vomiting, fevers, or diarrhea.  The patient also reports experiencing depressive symptoms more than half the days over the past month, following job loss on March 01, 2023. The patient feels stressed and is interested in pursuing therapy but declines medication at this time.  Review of Systems:  Constitutional: Denies fevers, chills, or weight change. Eyes: No visual changes. ENT: No sore throat or nasal congestion. Cardiovascular: No chest pain or palpitations. Respiratory: No cough or shortness of breath. Gastrointestinal: Denies nausea, vomiting, abdominal pain, or diarrhea. Genitourinary: Positive for urinary pressure during urination.  Denies burning sensation, increased frequency, urgency, incontinence, vaginal bleeding, or discharge. Musculoskeletal: Reports chronic back pain; no new joint pain or muscle weakness. Neurological: No headaches, dizziness,  or numbness. Psychiatric: Reports feelings of depression more than half the days; denies suicidal ideation. Skin: No rashes or lesions. Hematologic/Lymphatic: No easy bruising or lymphadenopathy. Endocrine: No heat or cold intolerance. Allergic/Immunologic: No known issues. Copy All other systems reviewed and are negative.     04/15/2023   10:40 AM 01/10/2023    2:59 PM 11/07/2020    1:44 PM 03/23/2016    3:42 PM 01/10/2013    9:51 AM  Depression screen PHQ 2/9  Decreased Interest 2 1 0 0 0  Down, Depressed, Hopeless 2 2 0 0 0  PHQ - 2 Score 4 3 0 0 0  Altered sleeping  1 0    Tired, decreased energy  1 2    Change in appetite  0 0    Feeling bad or failure about yourself   1 0    Trouble concentrating  0 0    Moving slowly or fidgety/restless  0 0    Suicidal thoughts  0 0    PHQ-9 Score  6 2    Difficult doing work/chores  Somewhat difficult Not difficult at all        01/10/2023    2:59 PM 11/07/2020    1:44 PM  GAD 7 : Generalized Anxiety Score  Nervous, Anxious, on Edge 2 0  Control/stop worrying 2 0  Worry too much - different things 2 0  Trouble relaxing 1 1  Restless 0 0  Easily annoyed or irritable 2 0  Afraid - awful might happen 1 0  Total GAD 7 Score 10 1  Anxiety Difficulty Somewhat difficult Not difficult at all      Past Surgical History:  Procedure Laterality Date   FOOT SURGERY     bilateral 2 right and 2 left foot surgeries - totatl 4   HAMMER TOE SURGERY  2007   left foot - toe next to pinky   MYOMECTOMY N/A 01/08/2014   Procedure: Abdominal MYOMECTOMY;  Surgeon: Hal Morales, MD;  Location: [facility];  Service: Gynecology;  Laterality: N/A;   WISDOM TOOTH EXTRACTION      Outpatient Medications Prior to Visit   Medication Sig Dispense Refill   albuterol (VENTOLIN HFA) 108 (90 Base) MCG/ACT inhaler Inhale 2 puffs into the lungs every 4 (four) hours as needed for wheezing or shortness of breath (coughing fits). 18 g 1   Bee Pollen 1000 MG TABS Take 1 tablet by mouth daily.       Biotin 2.5 MG TABS Take 1 tablet by mouth daily.       Cholecalciferol (VITAMIN D3) 2000 UNITS TABS Take 1 tablet by mouth daily.       Fluticasone-Umeclidin-Vilant (TRELEGY ELLIPTA) 100-62.5-25 MCG/ACT AEPB Inhale 1 puff then rinse mouth, once daily 60 each 12   ibuprofen (ADVIL,MOTRIN) 600 MG tablet 1  po  pc every 6 hours for 5 days then prn-pain 30 tablet 1   loratadine (CLARITIN) 10 MG tablet Take 10 mg by mouth daily as needed for allergies.      Magnesium 250 MG TABS Take 1 tablet by mouth daily.       Misc Natural Products (OSTEO BI-FLEX ADV DOUBLE ST PO) Take 1 tablet by mouth 2 (two) times daily.       montelukast (SINGULAIR) 10 MG tablet TAKE 1 TABLET BY MOUTH AT BEDTIME 30 tablet 6   tretinoin (RETIN-A) 0.025 % cream Apply to back nightly     VOLTAREN 1 % GEL Apply 2 g topically  2 (two) times daily as needed (arthritis pain in knee).      chlorpheniramine (CHLOR-TRIMETON) 4 MG tablet Take 4 mg by mouth daily at 12 noon. (Patient not taking: Reported on 04/15/2023)     chlorpheniramine-HYDROcodone (TUSSIONEX) 10-8 MG/5ML Take 5 mLs by mouth daily as needed. (Patient not taking: Reported on 04/15/2023) 150 mL 0   Fluticasone-Umeclidin-Vilant (TRELEGY ELLIPTA) 100-62.5-25 MCG/ACT AEPB Inhale 1 puff into the lungs daily.     norethindrone-ethinyl estradiol (OVCON-35) 0.4-35 MG-MCG tablet Vyfemla (28) 0.4 mg-35 mcg tablet (Patient not taking: Reported on 04/15/2023)     No facility-administered medications prior to visit.    Family History  Problem Relation Age of Onset   Hypertension Mother    Hyperlipidemia Mother    Arthritis Mother    Diabetes Father    Allergic rhinitis Sister    Breast cancer Sister 4    Breast cancer Sister 63   Osteoporosis Other    Colon cancer Neg Hx    Colon polyps Neg Hx    Esophageal cancer Neg Hx    Stomach cancer Neg Hx    Rectal cancer Neg Hx     Social History   Socioeconomic History   Marital status: Single    Spouse name: Not on file   Number of children: Not on file   Years of education: Not on file   Highest education level: Not on file  Occupational History   Occupation: Consultant  Tobacco Use   Smoking status: Never    Passive exposure: Never   Smokeless tobacco: Never  Vaping Use   Vaping status: Never Used  Substance and Sexual Activity   Alcohol use: Yes    Alcohol/week: 1.0 standard drink of alcohol    Types: 1 Glasses of wine per week    Comment: socially   Drug use: No   Sexual activity: Not Currently    Partners: Male    Birth control/protection: Pill  Other Topics Concern   Not on file  Social History Narrative   Exercise---  daily   Social Drivers of Health   Financial Resource Strain: Not on file  Food Insecurity: Not on file  Transportation Needs: Not on file  Physical Activity: Not on file  Stress: Not on file  Social Connections: Not on file  Intimate Partner Violence: Not on file                                                                                                  Objective:  Physical Exam: BP 110/70   Pulse 98   Temp 98.7 F (37.1 C) (Temporal)   Ht 5' 1.75" (1.568 m)   Wt 155 lb 12.8 oz (70.7 kg)   LMP 03/22/2021 (Approximate)   SpO2 98%   BMI 28.73 kg/m     Physical Exam Constitutional:      General: She is not in acute distress.    Appearance: Normal appearance. She is not ill-appearing or toxic-appearing.  HENT:     Head: Normocephalic and atraumatic.     Nose: Nose normal. No congestion.  Eyes:  General: No scleral icterus.    Extraocular Movements: Extraocular movements intact.  Cardiovascular:     Rate and Rhythm: Normal rate and regular rhythm.     Pulses: Normal  pulses.     Heart sounds: Normal heart sounds.  Pulmonary:     Effort: Pulmonary effort is normal. No respiratory distress.     Breath sounds: Normal breath sounds.  Abdominal:     General: Abdomen is flat. Bowel sounds are normal.     Palpations: Abdomen is soft.     Tenderness: There is no abdominal tenderness. There is no right CVA tenderness or left CVA tenderness.  Musculoskeletal:        General: Normal range of motion.  Lymphadenopathy:     Cervical: No cervical adenopathy.  Skin:    General: Skin is warm and dry.     Findings: No rash.  Neurological:     General: No focal deficit present.     Mental Status: She is alert and oriented to person, place, and time. Mental status is at baseline.  Psychiatric:        Mood and Affect: Mood is depressed.        Behavior: Behavior is cooperative.        Thought Content: Thought content does not include homicidal or suicidal ideation.     No results found.  Recent Results (from the past 2160 hours)  Lead, blood (adult age 32 yrs or greater)     Status: None   Collection Time: 02/04/23  2:34 PM  Result Value Ref Range   Lead <1.0 <3.5 mcg/dL    Comment: See Note 1 Note 1 . This test was developed and its analytical performance  characteristics have been determined by [employer]. It has not been cleared or approved by the FDA. This assay has been validated pursuant to the CLIA  regulations and is used for clinical purposes.         Garner Nash, MD, MS

## 2023-04-16 LAB — URINE CULTURE
MICRO NUMBER:: 15970302
SPECIMEN QUALITY:: ADEQUATE

## 2023-04-18 ENCOUNTER — Encounter: Payer: Self-pay | Admitting: Family Medicine

## 2023-04-18 DIAGNOSIS — M546 Pain in thoracic spine: Secondary | ICD-10-CM | POA: Diagnosis not present

## 2023-04-18 DIAGNOSIS — M5442 Lumbago with sciatica, left side: Secondary | ICD-10-CM | POA: Diagnosis not present

## 2023-04-18 DIAGNOSIS — M5441 Lumbago with sciatica, right side: Secondary | ICD-10-CM | POA: Diagnosis not present

## 2023-04-18 DIAGNOSIS — M9905 Segmental and somatic dysfunction of pelvic region: Secondary | ICD-10-CM | POA: Diagnosis not present

## 2023-04-18 DIAGNOSIS — M9902 Segmental and somatic dysfunction of thoracic region: Secondary | ICD-10-CM | POA: Diagnosis not present

## 2023-04-18 DIAGNOSIS — M2560 Stiffness of unspecified joint, not elsewhere classified: Secondary | ICD-10-CM | POA: Diagnosis not present

## 2023-04-18 DIAGNOSIS — M9904 Segmental and somatic dysfunction of sacral region: Secondary | ICD-10-CM | POA: Diagnosis not present

## 2023-04-18 DIAGNOSIS — M6019 Interstitial myositis, multiple sites: Secondary | ICD-10-CM | POA: Diagnosis not present

## 2023-04-18 DIAGNOSIS — M9903 Segmental and somatic dysfunction of lumbar region: Secondary | ICD-10-CM | POA: Diagnosis not present

## 2023-04-19 NOTE — Telephone Encounter (Signed)
Copied from CRM [id number]. Topic: Referral - Status >> Apr 19, 2023 11:14 AM Maxwell Marion wrote: Reason for CRM: Patient called to check on the status of out of network referral that was supposed to be sent over to her chiropractor since she has new insurance. Says she spoke with someone Friday who was supposed to get that sent over but [employer] said they have not received anything yet.

## 2023-04-19 NOTE — Telephone Encounter (Signed)
I called [employer] and spoke with a representative to check if there is an exception for out of network chiropractor. The representative said that a prior auth would need to be done from our office and faxed to [phone] or can be done online.

## 2023-04-25 NOTE — Telephone Encounter (Signed)
Chiropractic referral placed.

## 2023-04-26 ENCOUNTER — Telehealth: Payer: Self-pay | Admitting: Nurse Practitioner

## 2023-04-26 DIAGNOSIS — R053 Chronic cough: Secondary | ICD-10-CM | POA: Diagnosis not present

## 2023-04-26 NOTE — Telephone Encounter (Signed)
Copied from CRM [id number]. Topic: Referral - Question >> Apr 26, 2023 12:31 PM Gurney Maxin H wrote: Reason for CRM: Patient states provider gave referral for counseling to [facility] but facility doesn't have any appointments for a few months, patient would like to know if provider can give her a referral to a different place.  [place] [phone]

## 2023-04-26 NOTE — Telephone Encounter (Signed)
Patient is needing a call back regarding her referral for dermatologist she wants to know why does she have to come in and be seen in order to get a referral she would like a call back

## 2023-04-26 NOTE — Telephone Encounter (Signed)
Copied from CRM [id number]. Topic: General - Other >> Apr 26, 2023  1:02 PM Truddie Crumble wrote: Reason for CRM: [employer] called wanting to know if the provider has received a referral for an out of practice chiropractor for the patient because [facility] does not have the request. [employer] want to know if the provider has submitted it. Patient would like to be called. The referral chiropractor will contact [facility] to see if the patient is approved or denied  Called patient no answer; left VM and also sent her a message through [employer]

## 2023-04-28 NOTE — Progress Notes (Deleted)
 [facility] [address] [place] [address] Dept: [phone] Dept Fax: [phone]  Acute Care Office Visit  Subjective:   Shelley Norman October 28, 1970 04/29/2023  No chief complaint on file.   HPI: Discussed the use of AI scribe software for clinical note transcription with the patient, who gave verbal consent to proceed.  History of Present Illness             The following portions of the patient's history were reviewed and updated as appropriate: past medical history, past surgical history, family history, social history, allergies, medications, and problem list.   Patient Active Problem List   Diagnosis Date Noted   Hematuria 04/15/2023   Depression, recurrent (HCC) 04/15/2023   Pure hypercholesterolemia 01/10/2023   Routine general medical examination at a health care facility 01/10/2023   Asthmatic bronchitis , chronic (HCC) 02/17/2022   Fibrocystic disease of breast 11/12/2021   Menorrhagia 11/07/2020   Upper airway cough syndrome 10/30/2020   Lateral epicondylitis of right elbow 08/07/2020   Mastodynia 01/05/2018   Vaginospasm 01/05/2018   Osteoarthritis of left knee 09/08/2017   Mouth ulcers 11/22/2014   Dense breasts 04/26/2012   Uterine leiomyoma 02/01/2012   Chronic coughing 04/17/2010   Allergic rhinitis due to pollen 08/15/2008   Vitamin D deficiency 04/05/2008   Overweight 02/26/2008   Osteoarthritis 02/07/2008   Bunion, left foot 05/16/2007   FOOT SURGERY, HX OF 03/03/2007   Past Medical History:  Diagnosis Date   Allergic rhinitis    Bacterial infection    Dense breast tissue 11/07/2020   Fibroids    Mastodynia    Osteoarthritis    feet, knees, lower back - no meds   PONV (postoperative nausea and vomiting)    Vaginismus    Vitamin D deficiency    Yeast infection    Past Surgical History:  Procedure Laterality Date   FOOT SURGERY     bilateral 2 right and 2 left foot  surgeries - totatl 4   HAMMER TOE SURGERY  2007   left foot - toe next to pinky   MYOMECTOMY N/A 01/08/2014   Procedure: Abdominal MYOMECTOMY;  Surgeon: Hal Morales, MD;  Location: [facility];  Service: Gynecology;  Laterality: N/A;   WISDOM TOOTH EXTRACTION     Family History  Problem Relation Age of Onset   Hypertension Mother    Hyperlipidemia Mother    Arthritis Mother    Diabetes Father    Allergic rhinitis Sister    Breast cancer Sister 75   Breast cancer Sister 65   Osteoporosis Other    Colon cancer Neg Hx    Colon polyps Neg Hx    Esophageal cancer Neg Hx    Stomach cancer Neg Hx    Rectal cancer Neg Hx     Current Outpatient Medications:    albuterol (VENTOLIN HFA) 108 (90 Base) MCG/ACT inhaler, Inhale 2 puffs into the lungs every 4 (four) hours as needed for wheezing or shortness of breath (coughing fits)., Disp: 18 g, Rfl: 1   Bee Pollen 1000 MG TABS, Take 1 tablet by mouth daily.  , Disp: , Rfl:    Biotin 2.5 MG TABS, Take 1 tablet by mouth daily.  , Disp: , Rfl:    chlorpheniramine (CHLOR-TRIMETON) 4 MG tablet, Take 4 mg by mouth daily at 12 noon. (Patient not taking: Reported on 04/15/2023), Disp: , Rfl:    chlorpheniramine-HYDROcodone (TUSSIONEX) 10-8 MG/5ML, Take 5 mLs by mouth daily  as needed. (Patient not taking: Reported on 04/15/2023), Disp: 150 mL, Rfl: 0   Cholecalciferol (VITAMIN D3) 2000 UNITS TABS, Take 1 tablet by mouth daily.  , Disp: , Rfl:    Fluticasone-Umeclidin-Vilant (TRELEGY ELLIPTA) 100-62.5-25 MCG/ACT AEPB, Inhale 1 puff then rinse mouth, once daily, Disp: 60 each, Rfl: 12   Fluticasone-Umeclidin-Vilant (TRELEGY ELLIPTA) 100-62.5-25 MCG/ACT AEPB, Inhale 1 puff into the lungs daily., Disp: , Rfl:    ibuprofen (ADVIL,MOTRIN) 600 MG tablet, 1  po  pc every 6 hours for 5 days then prn-pain, Disp: 30 tablet, Rfl: 1   loratadine (CLARITIN) 10 MG tablet, Take 10 mg by mouth daily as needed for allergies. , Disp: , Rfl:    Magnesium 250 MG TABS, Take 1  tablet by mouth daily.  , Disp: , Rfl:    Misc Natural Products (OSTEO BI-FLEX ADV DOUBLE ST PO), Take 1 tablet by mouth 2 (two) times daily.  , Disp: , Rfl:    montelukast (SINGULAIR) 10 MG tablet, TAKE 1 TABLET BY MOUTH AT BEDTIME, Disp: 30 tablet, Rfl: 6   nitrofurantoin, macrocrystal-monohydrate, (MACROBID) 100 MG capsule, Take 1 capsule (100 mg total) by mouth 2 (two) times daily., Disp: 14 capsule, Rfl: 0   norethindrone-ethinyl estradiol (OVCON-35) 0.4-35 MG-MCG tablet, Vyfemla (28) 0.4 mg-35 mcg tablet (Patient not taking: Reported on 04/15/2023), Disp: , Rfl:    tretinoin (RETIN-A) 0.025 % cream, Apply to back nightly, Disp: , Rfl:    VOLTAREN 1 % GEL, Apply 2 g topically 2 (two) times daily as needed (arthritis pain in knee). , Disp: , Rfl:  Allergies  Allergen Reactions   Tetracycline Other (See Comments) and Itching    Yeast infection Yeast infection      ROS: A complete ROS was performed with pertinent positives/negatives noted in the HPI. The remainder of the ROS are negative.    Objective:   There were no vitals filed for this visit.  GENERAL: Well-appearing, in NAD. Well nourished.  SKIN: Pink, warm and dry. No rash, lesion, ulceration, or ecchymoses.  NECK: Trachea midline. Full ROM w/o pain or tenderness. No lymphadenopathy.  RESPIRATORY: Chest wall symmetrical. Respirations even and non-labored. Breath sounds clear to auscultation bilaterally.  CARDIAC: S1, S2 present, regular rate and rhythm. Peripheral pulses 2+ bilaterally.  MSK: Muscle tone and strength appropriate for age. Joints w/o tenderness, redness, or swelling. EXTREMITIES: Without clubbing, cyanosis, or edema.  NEUROLOGIC: No motor or sensory deficits. Steady, even gait.  PSYCH/MENTAL STATUS: Alert, oriented x 3. Cooperative, appropriate mood and affect.    No results found for any visits on 04/29/23.    Assessment & Plan:  Assessment and Plan               There are no diagnoses linked to  this encounter. No orders of the defined types were placed in this encounter.  No orders of the defined types were placed in this encounter.  Lab Orders  No laboratory test(s) ordered today   No images are attached to the encounter or orders placed in the encounter.  No follow-ups on file.   Salvatore Decent, FNP

## 2023-04-28 NOTE — Telephone Encounter (Signed)
Copied from CRM [id number]. Topic: Referral - Question >> Apr 28, 2023 12:23 PM Deaijah H wrote: Reason for CRM: Patient called in wanting to know why does she need an appointment for a dermatology referral when she didn't need one for an chiropractor. Patient would like someone to reach out and give further explanation due to appointment being tomorrow / please call [phone]   KO 04/28/2023 1:53p  LVM for pt to notify appt is required due to no prior documentation supporting dermatology concerns.

## 2023-04-29 ENCOUNTER — Ambulatory Visit: Payer: 59 | Admitting: Internal Medicine

## 2023-05-02 ENCOUNTER — Telehealth: Payer: Self-pay | Admitting: Internal Medicine

## 2023-05-02 MED ORDER — ALBUTEROL SULFATE HFA 108 (90 BASE) MCG/ACT IN AERS: 2.0000 | INHALATION_SPRAY | RESPIRATORY_TRACT | 5 refills | Status: AC | PRN

## 2023-05-02 MED ORDER — BENZONATATE 200 MG PO CAPS: 200.0000 mg | ORAL_CAPSULE | Freq: Three times a day (TID) | ORAL | 1 refills | Status: DC | PRN

## 2023-05-02 MED ORDER — HYDROCOD POLI-CHLORPHE POLI ER 10-8 MG/5ML PO SUER: 5.0000 mL | Freq: Every day | ORAL | 0 refills | Status: DC | PRN

## 2023-05-02 NOTE — Telephone Encounter (Signed)
 I have notified the patient. Nothing further needed.

## 2023-05-02 NOTE — Telephone Encounter (Signed)
Message from MyChart from patient. We do not have any acute appointments at the moment.   "Started having a congested cough 04/30/23. Need to see Dr. Maple Hudson to get Tesalon pearl, inhaler and some more Tussionex. "

## 2023-05-02 NOTE — Telephone Encounter (Signed)
Requested meds refilled at [facility].  Important that she keep her appointment on GFeb 10.

## 2023-05-02 NOTE — Telephone Encounter (Signed)
How long have your symptoms been going on for? Saturday, went to a concert this weekend and thinks she got something from there. Any fevers, chills or sweats? No fevers, chills or sweats Any cough? If so are you getting anything up? What color? Cough with white/yellow sputum Any increased SOB? Any wheezing? No Wheezing Has not tested for Covid and does not have a test at home to take  Albuterol-  Has not used Trelegy- a couple times a week  [facility] [address]

## 2023-05-05 DIAGNOSIS — N6489 Other specified disorders of breast: Secondary | ICD-10-CM | POA: Diagnosis not present

## 2023-05-06 ENCOUNTER — Telehealth: Payer: Self-pay | Admitting: Nurse Practitioner

## 2023-05-06 NOTE — Progress Notes (Signed)
 Patient ID: Shelley Norman, female    DOB: 1970/07/18, 53 y.o.   MRN: [id number]   HPI F never smoker, followed for Allergic Rhinitis and Cough, complicated by osteoarthritis,  Had seen Dr Waymond Hailey for upper airway cough> gabapentin , prilosec/pepcid, Singulair ,   Has worked with Allergist.> ST Pos for grass, trees, ragweed, weed, mold, cat and dust mites. Negative to common foods.  =========================================================   10/18/22-  52 yoF never smoker, followed for Allergic Rhinitis, chronic Cough, complicated by osteoarthritis,  Had seen Dr Waymond Hailey for upper airway cough> gabapentin , prilosec/pepcid, Singulair ,   Has worked with Allergist.> ST Pos for grass, trees, ragweed, weed, mold, cat and dust mites. Negative to common foods. > Breo 100, Ryaltris  nasal spray, Ventolin  hfa, - Trelegy 200, Singulair , Ventolin  hfa, Promethazine -DM, chlorpheniramine,  LOV Walsh, NP 02/17/22-continue Trelegy 100, singulair , claritin, flonase , Tussionex helps when needed for occasional nighttime cough.  Otherwise routine meds are holding.  Needs refills.  No acute issues.  05/09/23- 52 yoF never smoker, followed for Allergic Rhinitis, chronic Cough, complicated by osteoarthritis,  Had seen Dr Waymond Hailey for upper airway cough> gabapentin , prilosec/pepcid, Singulair ,   Has worked with Allergist.> ST Pos for grass, trees, ragweed, weed, mold, cat and dust mites. Negative to common foods. > Breo 100, Ryaltris  nasal spray, Ventolin  hfa, - Trelegy 100, Singulair , Ventolin  hfa, Promethazine -DM, chlorpheniramine, Tussionex,  Labs for Eos, IgE, Hypersensitivity Pneumonia panel have been  negative in past year.  Discussed the use of AI scribe software for clinical note transcription with the patient, who gave verbal consent to proceed.  History of Present Illness   The patient, with a history of chronic bronchitis, presents with a worsening of her chronic cough and congestion. She believes she contracted  a virus at a concert a week ago, which she subsequently passed on to her sister and brother-in-law. She reports feeling better but is struggling with congestion that she feels needs to be expelled.  She has previously worked with Dr. Waymond Hailey on upper airway cough and was prescribed gabapentin . She has also been taking Tessalon  Perles, which initially caused diarrhea but is now well-tolerated with pre-dosing Kaopectate. She reports that the Tessalon  Perles have been effective in managing her symptoms and plans to refill her prescription.  The patient also reports significant postnasal drip, requiring her to sleep elevated and constantly blow her nose. She has had blood tests for allergies in the past year, all of which were negative. She has also seen an allergist for skin testing.  She has a history of acid reflux and has tried several treatments without significant improvement. She has been managing her symptoms with Singulair , which she has not needed to take daily until her recent exacerbation.  The patient also mentions a recent chest x-ray at [facility], which was clear, and a need to refill her Trelegy inhaler. She expresses interest in getting the RSV vaccine- discussed.     Review of Systems-See HPI Constitutional:   No-   weight loss, night sweats, fevers, chills, fatigue, lassitude. HEENT:   No-  headaches, difficulty swallowing, tooth/dental problems, sore throat,       No-  sneezing, itching, ear ache, +nasal congestion, post nasal drip,  CV:  No-   chest pain, orthopnea, PND, swelling in lower extremities, anasarca, dizziness, palpitations Resp: No-   shortness of breath with exertion or at rest.              No-   productive cough,  + non-productive cough,  No- coughing up of blood.              No-   change in color of mucus.  No- wheezing.   Skin: No-   rash or lesions. GI:  No-   heartburn, indigestion, abdominal pain, nausea, vomiting,  GU:  MS:  No-   joint pain or swelling.   . Neuro-     nothing unusual Psych:  No- change in mood or affect. No depression or anxiety.  No memory loss.    Objective:   Physical Exam General- Alert, Oriented, Affect-appropriate, Distress- none acute Skin- rash-none, lesions- none, excoriation- none Lymphadenopathy- none Head- atraumatic            Eyes- Gross vision intact, PERRLA, conjunctivae clear secretions            Ears- Hearing, canals-normal            Nose- Clear, no-Septal dev, mucus, polyps, erosion, perforation             Throat- Mallampati II , mucosa clear , drainage- none, tonsils- atrophic; torus Neck- flexible , trachea midline, no stridor , thyroid  nl, carotid no bruit Chest - symmetrical excursion , unlabored           Heart/CV- RRR , no murmur , no gallop  , no rub, nl s1 s2           - JVD- none , edema- none, stasis changes- none, varices- none            Lung- clear to P&A, wheeze- none, cough-none , dullness-none, rub- none           Chest wall-  Abd-  Br/ Gen/ Rectal- Not done, not indicated Extrem- cyanosis- none, clubbing, none, atrophy- none, strength- nl Neuro- grossly intact to observation  Assessment and Plan    Chronic Bronchitis with Exacerbation Recent worsening of chronic cough and congestion, likely due to a viral infection. Tessalon  Perles have been effective and are well-tolerated with Kaopectate. No recent chest x-ray abnormalities. -Continue Tessalon  Perles as needed. -Consider Mucinex DM for additional mucus thinning and mild cough suppression. -Order Pulmonary Function Test for further evaluation of chronic cough.  Postnasal Drip Chronic issue, contributing to cough. No recent allergy testing abnormalities. -Continue current management strategies, including sleeping elevated.  General Health Maintenance Discussed RSV vaccine, patient considering whether to receive it. -Advise patient that RSV vaccine is well-tolerated and may be beneficial, but decision is ultimately up to  the patient.  Refill Request Trelegy inhaler -Send in refill for Trelegy inhaler.

## 2023-05-06 NOTE — Telephone Encounter (Signed)
 See documentation on tel enc 05/06/2023

## 2023-05-06 NOTE — Telephone Encounter (Signed)
 Copied from CRM [id number]. Topic: Clinical - Medication Question >> Apr 19, 2023  1:46 PM Shelley Norman wrote: Reason for CRM: pt called regard referral for mental health pt states she would like another referral [facility] is booked up for the next 2 months. Pt is requesting another mental health organization she can go to.  Shelley Norman will you be able to help with referral location change?

## 2023-05-06 NOTE — Telephone Encounter (Signed)
 E2C2 agent Corin called [employer]. Pt stated that she called office 3x today and was told she would get a call by 2:00pm. Pt requesting to speak with manager re: denial for chiropractic treatment.  I spoke to pt. She states auth for chiropractic care denied by [facility] due to not having received clinical documentation. She said her records were sent 1/29-1/30. Referral team denied having received records. Pt states this is OON and they need documentation to approve. She hasn't had an adjustment in a month. I contacted [facility] at [phone] and spoke with [facility] She advised large file was sent via email to [email or web address] on 04/28/2023. I have emailed [facility] to inquire about records.   Pt also asked about referral for [facility] stating [facility] cannot see her for 2 months. Advised pt that [facility] referrals are consistently scheduling 2-6 months out.

## 2023-05-06 NOTE — Telephone Encounter (Signed)
 Copied from CRM [id number]. Topic: Referral - Question >> May 06, 2023 12:53 PM Robinson H wrote: Reason for CRM: Patient calling upset regarding her out of network referral for chiropractor, patient states prior authorization that was sent to [employer] was denied because they didn't receive clinical notes. Patient states she was speaking with [facility] regarding referral.  Also, patient states she was given a referral for therapist but facility is booked out for 2 months and patient wants a referral to somewhere else. Please reach out to patient please, thank you.   Meila [phone]

## 2023-05-07 ENCOUNTER — Other Ambulatory Visit: Payer: 59

## 2023-05-09 ENCOUNTER — Telehealth: Payer: Self-pay | Admitting: Nurse Practitioner

## 2023-05-09 ENCOUNTER — Ambulatory Visit: Payer: Medicaid Other | Admitting: Internal Medicine

## 2023-05-09 ENCOUNTER — Encounter: Payer: Self-pay | Admitting: Internal Medicine

## 2023-05-09 VITALS — BP 126/74 | HR 84 | Temp 98.0°F | Ht 61.75 in | Wt 156.0 lb

## 2023-05-09 DIAGNOSIS — J4489 Other specified chronic obstructive pulmonary disease: Secondary | ICD-10-CM | POA: Diagnosis not present

## 2023-05-09 MED ORDER — TRELEGY ELLIPTA 100-62.5-25 MCG/ACT IN AEPB: INHALATION_SPRAY | RESPIRATORY_TRACT | 12 refills | Status: AC

## 2023-05-09 NOTE — Telephone Encounter (Signed)
Copied from CRM [id number]. Topic: Referral - Status >> May 09, 2023  1:56 PM Alcus Dad wrote: Reason for CRM: Patient is calling to check the status of her referral. Patient stated that this has been an ongoing issue for about a month and she would like to speak directly to [place].

## 2023-05-09 NOTE — Telephone Encounter (Signed)
 As noted on the referral  dated 04/29/23   Revd a call from [facility] ( 3rd party for [employer]) s/w Ammon Bales.. she stated PT has no OON Benefits. However she said she will escalate it to the Health plan but it will most likely be denied. But they will make the finally decision and it can be appealed. Per Ammon Bales she suggested that she find a INN provider

## 2023-05-09 NOTE — Telephone Encounter (Signed)
 Accepted transfer to clinic access line from [facility]. Pt calling to check status of records and submission of information to [facility]. Advised pt I spoke with chiropractor office and they advised having sent a very large file to [facility] HIM team email. I have emailed to follow up on the status of the records so we are able to proceed with submitting documents on her behalf. I have not received a response at this time. Advised pt I will contact her once I have an update and encouraged pt to call if she would like to speak with me again.

## 2023-05-09 NOTE — Patient Instructions (Signed)
 Order- schedule PFT   dx chronic bronchitis with exacerbation  Refill sent for Trelegy inhaler  You can try adding Mucinex-DM to loosen mucus and calm cough  Ok to continue tessalon  perles if that helps

## 2023-05-10 ENCOUNTER — Telehealth: Payer: Self-pay

## 2023-05-10 ENCOUNTER — Other Ambulatory Visit (HOSPITAL_COMMUNITY): Payer: Self-pay

## 2023-05-10 NOTE — Telephone Encounter (Signed)
*  Pulm  Pharmacy Patient Advocate Encounter  Received notification from [facility] that Prior Authorization for Trelegy Ellipta 100-62.5-25MCG/ACT aerosol powder  has been APPROVED from 05/10/2023 to 05/08/2024. Ran test claim, Copay is $4.00. This test claim was processed through [facility]- copay amounts may vary at other pharmacies due to pharmacy/plan contracts, or as the patient moves through the different stages of their insurance plan.   PA #/Case ID/Reference #: [facility]

## 2023-05-10 NOTE — Telephone Encounter (Signed)
Duplicate encounter. I spoke with pt 05/09/2023.

## 2023-05-12 ENCOUNTER — Encounter: Payer: Self-pay | Admitting: Family Medicine

## 2023-05-12 ENCOUNTER — Ambulatory Visit: Payer: Medicaid Other | Admitting: Family Medicine

## 2023-05-12 ENCOUNTER — Encounter: Payer: Self-pay | Admitting: Internal Medicine

## 2023-05-12 VITALS — BP 132/84 | Temp 97.5°F | Ht 61.0 in | Wt 155.2 lb

## 2023-05-12 DIAGNOSIS — L708 Other acne: Secondary | ICD-10-CM | POA: Diagnosis not present

## 2023-05-12 NOTE — Telephone Encounter (Signed)
Routing to PA team  Can you see if there is a PA needed for Tussionex?  Thanks!

## 2023-05-12 NOTE — Progress Notes (Signed)
Established Patient Office Visit   Subjective:  Patient ID: Shelley Norman, female    DOB: 07-16-1970  Age: 53 y.o. MRN: [id number]  Chief Complaint  Patient presents with   Referral    Pt requesting derm referral. Pt states her dermatologist would not write her a referral.  Dr. Nicholes Mango.     HPI Encounter Diagnoses  Name Primary?   Other acne Yes   Ongoing history of acne on her back is doing well adult that seemed to start after she discontinued oral birth control.  It has been treated with Cleocin and Retin-A.  It has responded to these but patient continues to experience outbreaks.  She would hopefully like to be on something to prevent the outbreaks.  She is also concerned about the areas of dark pigmentation after treatment with the Retin-A.  History of nodular cystic acne involving her face that had been treated with Accutane when she was much younger.   Review of Systems  Constitutional: Negative.   HENT: Negative.    Eyes:  Negative for blurred vision, discharge and redness.  Respiratory: Negative.    Cardiovascular: Negative.   Gastrointestinal:  Negative for abdominal pain.  Genitourinary: Negative.   Musculoskeletal: Negative.  Negative for myalgias.  Skin:  Positive for rash.  Neurological:  Negative for tingling, loss of consciousness and weakness.  Endo/Heme/Allergies:  Negative for polydipsia.     Current Outpatient Medications:    albuterol (VENTOLIN HFA) 108 (90 Base) MCG/ACT inhaler, Inhale 2 puffs into the lungs every 4 (four) hours as needed for wheezing or shortness of breath (coughing fits)., Disp: 18 g, Rfl: 5   Bee Pollen 1000 MG TABS, Take 1 tablet by mouth daily.  , Disp: , Rfl:    benzonatate (TESSALON) 200 MG capsule, Take 1 capsule (200 mg total) by mouth 3 (three) times daily as needed for cough., Disp: 30 capsule, Rfl: 1   Biotin 2.5 MG TABS, Take 1 tablet by mouth daily.  , Disp: , Rfl:    chlorpheniramine  (CHLOR-TRIMETON) 4 MG tablet, Take 4 mg by mouth daily at 12 noon., Disp: , Rfl:    chlorpheniramine-HYDROcodone (TUSSIONEX) 10-8 MG/5ML, Take 5 mLs by mouth daily as needed., Disp: 150 mL, Rfl: 0   Cholecalciferol (VITAMIN D3) 2000 UNITS TABS, Take 1 tablet by mouth daily.  , Disp: , Rfl:    Fluticasone-Umeclidin-Vilant (TRELEGY ELLIPTA) 100-62.5-25 MCG/ACT AEPB, Inhale 1 puff then rinse mouth, once daily, Disp: 60 each, Rfl: 12   ibuprofen (ADVIL,MOTRIN) 600 MG tablet, 1  po  pc every 6 hours for 5 days then prn-pain, Disp: 30 tablet, Rfl: 1   loratadine (CLARITIN) 10 MG tablet, Take 10 mg by mouth daily as needed for allergies. , Disp: , Rfl:    Magnesium 250 MG TABS, Take 1 tablet by mouth daily.  , Disp: , Rfl:    Misc Natural Products (OSTEO BI-FLEX ADV DOUBLE ST PO), Take 1 tablet by mouth 2 (two) times daily.  , Disp: , Rfl:    montelukast (SINGULAIR) 10 MG tablet, TAKE 1 TABLET BY MOUTH AT BEDTIME, Disp: 30 tablet, Rfl: 6   Omega-3 Fatty Acids (FISH OIL) 1000 MG CPDR, Take 1 capsule by mouth daily., Disp: , Rfl:    tretinoin (RETIN-A) 0.025 % cream, Apply to back nightly, Disp: , Rfl:    VOLTAREN 1 % GEL, Apply 2 g topically 2 (two) times daily as needed (arthritis pain in knee). , Disp: , Rfl:  Objective:     BP 138/84   Temp (!) 97.5 F (36.4 C)   Ht 5\' 1"  (1.549 m)   Wt 155 lb 3.2 oz (70.4 kg)   LMP 03/22/2021 (Approximate)   SpO2 95%   BMI 29.32 kg/m    Physical Exam Constitutional:      General: She is not in acute distress.    Appearance: Normal appearance. She is not ill-appearing, toxic-appearing or diaphoretic.  HENT:     Head: Normocephalic and atraumatic.     Right Ear: External ear normal.     Left Ear: External ear normal.  Eyes:     General: No scleral icterus.       Right eye: No discharge.        Left eye: No discharge.     Extraocular Movements: Extraocular movements intact.     Conjunctiva/sclera: Conjunctivae normal.  Pulmonary:     Effort:  Pulmonary effort is normal. No respiratory distress.  Skin:    General: Skin is warm and dry.       Neurological:     Mental Status: She is alert and oriented to person, place, and time.  Psychiatric:        Mood and Affect: Mood normal.        Behavior: Behavior normal.      No results found for any visits on 05/12/23.    The 10-year ASCVD risk score (Arnett DK, et al., 2019) is: 2.5%    Assessment & Plan:   Other acne -     Ambulatory referral to Dermatology    Return Schedule follow-up with Lauren.Mliss Sax, MD

## 2023-05-13 ENCOUNTER — Other Ambulatory Visit: Payer: Self-pay | Admitting: Obstetrics and Gynecology

## 2023-05-13 DIAGNOSIS — R9389 Abnormal findings on diagnostic imaging of other specified body structures: Secondary | ICD-10-CM

## 2023-05-13 NOTE — Telephone Encounter (Signed)
[facility] has not received email with records. I contacted [facility] and requested direct fax to [phone] for the last 12 months records.   I spoke with pt to notify of status and my most recent request for records. Pt understanding and appreciative of follow up.

## 2023-05-13 NOTE — Telephone Encounter (Signed)
Typically Tussionex and other cough suppressants are not covered unless the patient has a dx of cancer. I can try to proceed with the authorization if you want but it is not covered for bronchitis/pneumonia/cough and similar diagnosis.

## 2023-05-16 NOTE — Telephone Encounter (Signed)
 Dr Maple Hudson, tussionex was not covered by this pt's insurance  Per pharmacy team:    05/13/23  8:47 AM Note Typically Tussionex and other cough suppressants are not covered unless the patient has a dx of cancer. I can try to proceed with the authorization if you want but it is not covered for bronchitis/pneumonia/cough and similar diagnosis      Would you like them to try for PA or change to another cough med  Please advise, thanks!  Allergies  Allergen Reactions   Tetracycline Other (See Comments) and Itching    Yeast infection Yeast infection

## 2023-05-16 NOTE — Telephone Encounter (Signed)
 Please try for prior auth for tussionex this time. Thanks

## 2023-05-17 NOTE — Telephone Encounter (Signed)
 Received records from [facility] and will upload to media tab. Notified pt that records were received and will will submit to [facility]. Pt will follow up with [employer] in the next week if she has not heard anything for auth status.

## 2023-05-18 NOTE — Telephone Encounter (Signed)
 Fax has been sent with an attempt to attach the records from the Media Tab of pts previous record. To [phone]. Shelley Norman has also agreed to Hard fax it on Tomorrow  2/19 when they are back in the office as well.     I called PT lvm giving her the update on the denial,and appeal process with the  new reference # [id number]  and the # to call to appeal  [phone]

## 2023-05-19 ENCOUNTER — Encounter: Payer: Self-pay | Admitting: Internal Medicine

## 2023-05-19 NOTE — Telephone Encounter (Signed)
 Called pt and she confirmed receiving msg 2/19 from [place]. She has filed appeal and insurance advised records will be reviewed that have been faxed.

## 2023-05-19 NOTE — Telephone Encounter (Signed)
 Follow up with PA for this patient medication , per Dr Maple Hudson he wanted go ahead with PA  his note encounter on 05/15/22

## 2023-05-23 ENCOUNTER — Other Ambulatory Visit (HOSPITAL_COMMUNITY): Payer: Self-pay

## 2023-05-23 NOTE — Telephone Encounter (Signed)
 Patients [facility] does not cover Tussionex- it is a plan exclusion. Test claims show that patients other plan through [employer] last covered medication on 02/03 and it is too soon to refill through this plan. No further action needed from PA team at this time.

## 2023-05-24 NOTE — Telephone Encounter (Signed)
 Ok please just let her know

## 2023-05-24 NOTE — Telephone Encounter (Signed)
 Dr. Maple Hudson, per pharmacy team:  Monia Pouch last covered medication on 02/03 and it is too soon to refill through this plan   Medicaid does not cover this medication at all, no PA allowed.   FYI - thank you!

## 2023-05-27 ENCOUNTER — Ambulatory Visit
Admission: RE | Admit: 2023-05-27 | Discharge: 2023-05-27 | Disposition: A | Discharge status: Home / Self Care | Payer: Medicaid Other | Source: Ambulatory Visit | Attending: Obstetrics and Gynecology | Admitting: Obstetrics and Gynecology

## 2023-05-27 ENCOUNTER — Other Ambulatory Visit: Payer: Self-pay | Admitting: Obstetrics and Gynecology

## 2023-05-27 ENCOUNTER — Other Ambulatory Visit: Payer: Self-pay | Admitting: Body Imaging

## 2023-05-27 DIAGNOSIS — R9389 Abnormal findings on diagnostic imaging of other specified body structures: Secondary | ICD-10-CM

## 2023-05-27 DIAGNOSIS — R928 Other abnormal and inconclusive findings on diagnostic imaging of breast: Secondary | ICD-10-CM | POA: Diagnosis not present

## 2023-05-27 DIAGNOSIS — D0501 Lobular carcinoma in situ of right breast: Secondary | ICD-10-CM | POA: Diagnosis not present

## 2023-05-27 DIAGNOSIS — R92331 Mammographic heterogeneous density, right breast: Secondary | ICD-10-CM | POA: Diagnosis not present

## 2023-05-27 MED ORDER — GADOPICLENOL 0.5 MMOL/ML IV SOLN
7.0000 mL | Freq: Once | INTRAVENOUS | Status: AC | PRN
  Administered 2023-05-27: 7 mL via INTRAVENOUS

## 2023-05-30 ENCOUNTER — Telehealth: Payer: Self-pay | Admitting: Internal Medicine

## 2023-05-30 ENCOUNTER — Other Ambulatory Visit: Payer: Self-pay | Admitting: Internal Medicine

## 2023-05-30 NOTE — Telephone Encounter (Signed)
 Please see 2/13 pt message. Can we do a PA for her. She is an elevated PT. Delray Alt is now pulled into the loop on this. Thanks.

## 2023-05-30 NOTE — Telephone Encounter (Signed)
 Please refer to 2/13 mychart message.   Called and spoke to patient. She stated that she was advised by [employer] that a PA was needed for tussionex and she may be eligable for reimbursement.  Pt expressed some concerned about that how lengthy of a process this is. I apologized for the inconvenience and ensure that this would be addressed. She also stated that [employer] did cover tussionex on 2/3. She had to pay out of pocket.  She would like PA submitted ASAP. PA, please advise. Thanks

## 2023-05-30 NOTE — Telephone Encounter (Signed)
 She said she is with  [facility] Shield/[employer].  Never mid. Already Done:  Patients Healthy [facility] does not cover Tussionex- it is a plan exclusion. Test claims show that patients other plan through [employer] last covered medication on 02/03 and it is too soon to refill through this plan. No further action needed from PA team at this time.

## 2023-05-30 NOTE — Telephone Encounter (Signed)
 PT calling a second time.Please see PT message for more information. Her # is [phone]

## 2023-05-30 NOTE — Telephone Encounter (Signed)
 Please refer to 05/29/33 phone note.

## 2023-05-30 NOTE — Telephone Encounter (Signed)
 PT calling upset saying she not getting the answers she wanted from the CMA. Please call at cell or work #. She is asking for a Production designer, theatre/television/film.

## 2023-05-31 ENCOUNTER — Other Ambulatory Visit (HOSPITAL_COMMUNITY): Payer: Self-pay

## 2023-05-31 LAB — SURGICAL PATHOLOGY

## 2023-05-31 NOTE — Telephone Encounter (Signed)
 Tussionex is a plan exclusion for Medicaid plans. It is not on their formulary at all and they will not cover

## 2023-05-31 NOTE — Telephone Encounter (Signed)
 Benzonatate refilled [facility]

## 2023-05-31 NOTE — Telephone Encounter (Signed)
 Lm for patient.

## 2023-06-06 NOTE — Telephone Encounter (Addendum)
 Pt is aware of below message and voiced her understanding. Nothing further needed.

## 2023-06-07 DIAGNOSIS — D0501 Lobular carcinoma in situ of right breast: Secondary | ICD-10-CM | POA: Diagnosis not present

## 2023-06-07 NOTE — Telephone Encounter (Signed)
 Patient is calling about Tussionex.Patient states an appeal can be made for her to still receive the medication at a discounted price.Her insurance is [employer]. Please contact patient for additional information at [phone].

## 2023-06-09 NOTE — Telephone Encounter (Signed)
 This medication is not covered at all under [employer] or typically any plan unless the patient has a diagnosis of cancer causing cough- patient may use a goodrx discount card.

## 2023-06-09 NOTE — Telephone Encounter (Addendum)
 Left message on patients VM to inform medication is a plan exclusion.  See other telephone contact from 05/30/2023 from our pharmacy team.  Delray Alt spoke with patient on 06/06/2023 and informed per insurance, medication is a plan exclusion and is not covered.     Margie,  do we need to do anything else with this encounter?  Please advise.

## 2023-06-09 NOTE — Telephone Encounter (Signed)
 Called and spoke with patient.  Gave all information per pharmacy team as of today, 06/09/2023. Patient verbalized understanding.

## 2023-06-09 NOTE — Telephone Encounter (Signed)
 Amy, it looks like patient is now wanting an appeal to receive Tussionex at a discounted price.

## 2023-06-09 NOTE — Telephone Encounter (Signed)
 CY, please advise if you would be willing to do appeal for Tussionex?  PA team, please advise if you are familiar with appeal to get Tussionex at a discounted price?

## 2023-06-14 DIAGNOSIS — F4321 Adjustment disorder with depressed mood: Secondary | ICD-10-CM | POA: Diagnosis not present

## 2023-06-22 ENCOUNTER — Telehealth: Payer: Self-pay

## 2023-06-22 NOTE — Telephone Encounter (Signed)
 Left message on voicemail about appointment on 3/27

## 2023-06-23 ENCOUNTER — Encounter: Payer: Self-pay | Admitting: Hematology and Oncology

## 2023-06-23 ENCOUNTER — Inpatient Hospital Stay: Attending: Hematology and Oncology | Admitting: Hematology and Oncology

## 2023-06-23 VITALS — BP 136/77 | HR 97 | Temp 98.0°F | Resp 16 | Wt 157.1 lb

## 2023-06-23 DIAGNOSIS — Z803 Family history of malignant neoplasm of breast: Secondary | ICD-10-CM | POA: Diagnosis not present

## 2023-06-23 DIAGNOSIS — Z86018 Personal history of other benign neoplasm: Secondary | ICD-10-CM | POA: Diagnosis not present

## 2023-06-23 DIAGNOSIS — D0501 Lobular carcinoma in situ of right breast: Secondary | ICD-10-CM | POA: Diagnosis not present

## 2023-06-23 MED ORDER — TAMOXIFEN CITRATE 10 MG PO TABS: 5.0000 mg | ORAL_TABLET | Freq: Every day | ORAL | 3 refills | Status: DC

## 2023-06-23 NOTE — Progress Notes (Signed)
 [facility] CONSULT NOTE  Patient Care Team: Gerre Scull, NP as PCP - General (Internal Medicine)  CHIEF COMPLAINTS/PURPOSE OF CONSULTATION:  Extensive LCIS.  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  She was seen by Dr. Carolynne Edouard in breast surgery.  She tells me that she has significant family history of breast cancer and hence although the mammogram did not show any concerning abnormalities she pursued an MRI for intensive screening which detected the extensive LCIS.  She is meeting with Dr. Carolynne Edouard back to discuss surgical options.  Today we have discussed about etiology, pathogenesis of LCIS, treatment recommendations including antiestrogen therapy.  With regards to surgery, we have discussed that this may not be immediately necessary but she may have additional discussion with Dr. Carolynne Edouard about the options for surgery.  Given her perimenopausal status I focused my discussion on tamoxifen today.  We have reviewed the mechanism of action of tamoxifen, adverse effects of tamoxifen including but not limited to postmenopausal symptoms such as hot flashes, vaginal discharge, transaminitis, increased risk of DVT/PE, endometrial hyperplasia, endometrial carcinoma, benefit on bone density and questionable risk of stroke.  I have also given her printed information for detailed review about tamoxifen.  We have reviewed about the role of standard dose versus low-dose tamoxifen.  She is hoping to take 5 mg of tamoxifen daily.  This has been dispensed to the pharmacy of her choice.  With regards to intensive screening, we have discussed about considering contrast-enhanced mammogram alternating with MRI since her mammogram did not pick up any abnormalities.  We have discussed about the unknown long-term risk of gadolinium deposition  and increased sensitivity with MRIs leading to additional biopsies.  She is willing to proceed with this.  Fibroids Fibroids previously removed. Discussed tamoxifen's potential to exacerbate fibroids.  We have also discussed about risk of endometrial hyperplasia and endometrial cancer with tamoxifen. - Monitor fibroid status with gynecologist if symptoms worsen. - Inform gynecologist about tamoxifen use for LCIS. -Annual CMP, last CMP in Oct with no transaminitis.  All her questions were answered to the best of my knowledge.  I will see her back in about 6 months.  She was encouraged to call us with any new questions or concerns.  Thank you for consulting [place] in the care of this patient.  Please do not hesitate to contact us with any additional questions or concerns.  HISTORY OF PRESENTING ILLNESS:  Shelley Norman 53 y.o. female is here because of LCIS  Discussed the use of AI scribe software for clinical note transcription with the patient, who gave verbal consent to proceed.  History of Present Illness    Shelley Norman is a 53 year old female who presents with lobular carcinoma in situ. She was referred by Dr. Carolynne Edouard for evaluation of lobular carcinoma in situ.  An MRI revealed a large area of abnormality in her breast, which was biopsied and diagnosed as lobular carcinoma in situ (LCIS). The mammogram did not show any abnormalities, but the MRI findings prompted further investigation. The LCIS is extensive, but no invasive cancer was detected in the biopsy samples.  She has not had a menstrual cycle since December, which may be related to perimenopause. No leg swelling or other significant symptoms.  Her past medical history includes fibroids, which were removed, and bunionectomies on both feet.  She has a family history of breast cancer; her sister also took tamoxifen for ten years.  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 She is involved in a legal case regarding her employment termination. She maintains a healthy diet and regular exercise routine.  All other systems were reviewed with the patient and are negative.  MEDICAL HISTORY:  Past Medical History:  Diagnosis Date   Allergic rhinitis    Bacterial infection    Dense breast tissue 11/07/2020   Fibroids    Mastodynia    Osteoarthritis    feet, knees, lower back - no meds   PONV (postoperative nausea and vomiting)    Vaginismus    Vitamin D deficiency    Yeast infection     SURGICAL HISTORY: Past Surgical History:  Procedure Laterality Date   FOOT SURGERY     bilateral 2 right and 2 left foot surgeries - totatl 4   HAMMER TOE SURGERY  2007   left foot - toe next to pinky   MYOMECTOMY N/A 01/08/2014   Procedure: Abdominal MYOMECTOMY;  Surgeon: Hal Morales, MD;  Location: [facility];  Service: Gynecology;  Laterality: N/A;   WISDOM TOOTH EXTRACTION      SOCIAL HISTORY: Social History   Socioeconomic History   Marital status: Single    Spouse name: Not on file   Number of children: Not on file   Years of education: Not on file   Highest education level: Not on file  Occupational History   Occupation: Consultant  Tobacco Use   Smoking status: Never    Passive exposure: Never   Smokeless tobacco: Never  Vaping Use   Vaping status: Never Used  Substance and Sexual Activity   Alcohol use: Yes    Alcohol/week: 1.0 standard drink of alcohol    Types: 1 Glasses of wine per week    Comment: socially   Drug use: No   Sexual activity: Not Currently    Partners: Male    Birth control/protection: Pill  Other Topics Concern   Not on file  Social History Narrative   Exercise---  daily   Social Drivers of Health   Financial Resource Strain: Not on file  Food Insecurity: Not on file  Transportation Needs: Not on file  Physical Activity: Not on file  Stress: Not on file  Social Connections: Not on file  Intimate  Partner Violence: Not on file    FAMILY HISTORY: Family History  Problem Relation Age of Onset   Hypertension Mother    Hyperlipidemia Mother    Arthritis Mother    Diabetes Father    Allergic rhinitis Sister    Breast cancer Sister 81   Breast cancer Sister 26   Osteoporosis Other    Colon cancer Neg Hx    Colon polyps Neg Hx    Esophageal cancer Neg Hx    Stomach cancer Neg Hx    Rectal cancer Neg Hx     ALLERGIES:  is allergic to tetracycline.  MEDICATIONS:  Current Outpatient Medications  Medication Sig Dispense Refill   tamoxifen (NOLVADEX) 10 MG tablet Take 0.5 tablets (5 mg total) by mouth daily. 45 tablet 3   albuterol (VENTOLIN HFA) 108 (90 Base) MCG/ACT inhaler Inhale 2 puffs into the lungs every 4 (four) hours as needed for wheezing or shortness of breath (coughing fits). 18 g 5   Bee Pollen 1000 MG TABS Take 1 tablet  by mouth daily.       benzonatate (TESSALON) 200 MG capsule TAKE 1 CAPSULE (200 MG TOTAL) BY MOUTH 3 (THREE) TIMES DAILY AS NEEDED FOR COUGH. 30 capsule 5   Biotin 2.5 MG TABS Take 1 tablet by mouth daily.       chlorpheniramine (CHLOR-TRIMETON) 4 MG tablet Take 4 mg by mouth daily at 12 noon.     chlorpheniramine-HYDROcodone (TUSSIONEX) 10-8 MG/5ML Take 5 mLs by mouth daily as needed. 150 mL 0   Cholecalciferol (VITAMIN D3) 2000 UNITS TABS Take 1 tablet by mouth daily.       Fluticasone-Umeclidin-Vilant (TRELEGY ELLIPTA) 100-62.5-25 MCG/ACT AEPB Inhale 1 puff then rinse mouth, once daily 60 each 12   ibuprofen (ADVIL,MOTRIN) 600 MG tablet 1  po  pc every 6 hours for 5 days then prn-pain 30 tablet 1   loratadine (CLARITIN) 10 MG tablet Take 10 mg by mouth daily as needed for allergies.      Magnesium 250 MG TABS Take 1 tablet by mouth daily.       Misc Natural Products (OSTEO BI-FLEX ADV DOUBLE ST PO) Take 1 tablet by mouth 2 (two) times daily.       montelukast (SINGULAIR) 10 MG tablet TAKE 1 TABLET BY MOUTH AT BEDTIME 30 tablet 6   Omega-3 Fatty  Acids (FISH OIL) 1000 MG CPDR Take 1 capsule by mouth daily.     tretinoin (RETIN-A) 0.025 % cream Apply to back nightly     VOLTAREN 1 % GEL Apply 2 g topically 2 (two) times daily as needed (arthritis pain in knee).      No current facility-administered medications for this visit.     PHYSICAL EXAMINATION: ECOG PERFORMANCE STATUS: 0 - Asymptomatic  Vitals:   06/23/23 1019  BP: 136/77  Pulse: 97  Resp: 16  Temp: 98 F (36.7 C)  SpO2: 100%   Filed Weights   06/23/23 1019  Weight: 157 lb 1.6 oz (71.3 kg)    GENERAL:alert, no distress and comfortable Bilateral breasts inspected. No palpable masses or regional adenopathy. No LE edema  LABORATORY DATA:  I have reviewed the data as listed Lab Results  Component Value Date   WBC 3.9 (L) 01/10/2023   HGB 12.2 01/10/2023   HCT 37.6 01/10/2023   MCV 96.7 01/10/2023   PLT 298.0 01/10/2023     Chemistry      Component Value Date/Time   NA 136 01/10/2023 1450   K 3.9 01/10/2023 1450   CL 103 01/10/2023 1450   CO2 27 01/10/2023 1450   BUN 10 01/10/2023 1450   CREATININE 0.92 01/10/2023 1450      Component Value Date/Time   CALCIUM 9.6 01/10/2023 1450   ALKPHOS 75 01/10/2023 1450   AST 25 01/10/2023 1450   ALT 33 01/10/2023 1450   BILITOT 0.6 01/10/2023 1450       RADIOGRAPHIC STUDIES: I have personally reviewed the radiological images as listed and agreed with the findings in the report. MR RT BREAST BX W LOC DEV 1ST LESION IMAGE BX SPEC MR GUIDE Addendum Date: 05/31/2023 ADDENDUM REPORT: 05/31/2023 15:18 ADDENDUM: PATHOLOGY revealed: Site 1. Breast, right, needle core biopsy, Inferior posterior (cylinder clip) : EXTENSIVE LOBULAR CARCINOMA IN SITU SHOWING DUCTAL INVOLVEMENT. NEGATIVE FOR INVASIVE CARCINOMA NEGATIVE FOR MICROCALCIFICATIONS. Pathology results are CONCORDANT with imaging findings, per Dr. Frederico Hamman with excision recommended. PATHOLOGY revealed: Site 2. Breast, right, needle core biopsy, Inferior  anterior (barbell clip) : EXTENSIVE LOBULAR CARCINOMA IN SITU SHOWING DUCTAL INVOLVEMENT  NEGATIVE FOR INVASIVE CARCINOMA. NEGATIVE FOR MICROCALCIFICATIONS. Pathology results are CONCORDANT with imaging findings, per Dr. Frederico Hamman with excision recommended. Pathology results and recommendations below were discussed with patient by telephone on 05/31/2023 by Rene Kocher RN. Patient reported biopsy site within normal limits with slight tenderness at the site. Post biopsy care instructions were reviewed, questions were answered and my direct phone number was provided to patient. Patient was instructed to call [facility] if any concerns or questions arise related to the biopsy. RECOMMENDATION: -Recommend surgical consultation for excision for both sites 1 and 2 and the tissue in between. -Consider performing a 3 site RSL localization, considering that the MRI abnormality is prominent and extensive and extends from chest wall to the anterior breast and to the inferior aspect of the fibroglandular tissue in the central inferior breast. -Surgical consultation has been arranged for patient to see Dr. Chevis Pretty on 06/07/2023 at 11:30 o'clock at [facility] surgery. Pathology results reported by Randa Lynn RN on 05/31/2023. Electronically Signed   By: Frederico Hamman M.D.   On: 05/31/2023 15:18   Result Date: 05/31/2023 CLINICAL DATA:  53 year old female presenting for MRI guided biopsy of non mass enhancement in the inferior right breast. EXAM: MRI GUIDED CORE NEEDLE BIOPSY OF THE RIGHT BREAST TECHNIQUE: Multiplanar, multisequence MR imaging of the right breast was performed both before and after administration of intravenous contrast. CONTRAST:  7 mL of Vueway COMPARISON:  Previous exam(s). FINDINGS: I met with the patient, and we discussed the procedure of MRI guided biopsy, including risks, benefits, and alternatives. Specifically, we discussed the risks of infection, bleeding, tissue  injury, clip migration, and inadequate sampling. Informed, written consent was given. The usual time out protocol was performed immediately prior to the procedure. Using sterile technique, 1% Lidocaine, MRI guidance, and a 9 gauge vacuum assisted device, biopsy was performed of the posterior and anterior margin of non mass enhancement in the inferior right breast using a lateral approach for each. At the conclusion of the procedure, a cylinder tissue marker clip was deployed into the biopsy cavity in the posterior biopsy and a barbell shaped tissue marker clip was deployed in the biopsy cavity in the anterior biopsy. Follow-up 2-view mammogram was performed and dictated separately. IMPRESSION: MRI guided biopsy of the posterior (cylinder clip) and anterior (barbell clip) margin of non mass enhancement in the inferior right breast. No apparent complications. Electronically Signed: By: Frederico Hamman M.D. On: 05/27/2023 09:19   MR RT BREAST BX W LOC DEV EA ADD LESION IMAGE BX SPEC MR GUIDE Addendum Date: 05/31/2023 ADDENDUM REPORT: 05/31/2023 15:18 ADDENDUM: PATHOLOGY revealed: Site 1. Breast, right, needle core biopsy, Inferior posterior (cylinder clip) : EXTENSIVE LOBULAR CARCINOMA IN SITU SHOWING DUCTAL INVOLVEMENT. NEGATIVE FOR INVASIVE CARCINOMA NEGATIVE FOR MICROCALCIFICATIONS. Pathology results are CONCORDANT with imaging findings, per Dr. Frederico Hamman with excision recommended. PATHOLOGY revealed: Site 2. Breast, right, needle core biopsy, Inferior anterior (barbell clip) : EXTENSIVE LOBULAR CARCINOMA IN SITU SHOWING DUCTAL INVOLVEMENT NEGATIVE FOR INVASIVE CARCINOMA. NEGATIVE FOR MICROCALCIFICATIONS. Pathology results are CONCORDANT with imaging findings, per Dr. Frederico Hamman with excision recommended. Pathology results and recommendations below were discussed with patient by telephone on 05/31/2023 by Rene Kocher RN. Patient reported biopsy site within normal limits with slight tenderness at the  site. Post biopsy care instructions were reviewed, questions were answered and my direct phone number was provided to patient. Patient was instructed to call [facility] if any concerns or questions  arise related to the biopsy. RECOMMENDATION: -Recommend surgical consultation for excision for both sites 1 and 2 and the tissue in between. -Consider performing a 3 site RSL localization, considering that the MRI abnormality is prominent and extensive and extends from chest wall to the anterior breast and to the inferior aspect of the fibroglandular tissue in the central inferior breast. -Surgical consultation has been arranged for patient to see Dr. Chevis Pretty on 06/07/2023 at 11:30 o'clock at [facility] surgery. Pathology results reported by Randa Lynn RN on 05/31/2023. Electronically Signed   By: Frederico Hamman M.D.   On: 05/31/2023 15:18   Result Date: 05/31/2023 CLINICAL DATA:  53 year old female presenting for MRI guided biopsy of non mass enhancement in the inferior right breast. EXAM: MRI GUIDED CORE NEEDLE BIOPSY OF THE RIGHT BREAST TECHNIQUE: Multiplanar, multisequence MR imaging of the right breast was performed both before and after administration of intravenous contrast. CONTRAST:  7 mL of Vueway COMPARISON:  Previous exam(s). FINDINGS: I met with the patient, and we discussed the procedure of MRI guided biopsy, including risks, benefits, and alternatives. Specifically, we discussed the risks of infection, bleeding, tissue injury, clip migration, and inadequate sampling. Informed, written consent was given. The usual time out protocol was performed immediately prior to the procedure. Using sterile technique, 1% Lidocaine, MRI guidance, and a 9 gauge vacuum assisted device, biopsy was performed of the posterior and anterior margin of non mass enhancement in the inferior right breast using a lateral approach for each. At the conclusion of the procedure, a cylinder tissue marker  clip was deployed into the biopsy cavity in the posterior biopsy and a barbell shaped tissue marker clip was deployed in the biopsy cavity in the anterior biopsy. Follow-up 2-view mammogram was performed and dictated separately. IMPRESSION: MRI guided biopsy of the posterior (cylinder clip) and anterior (barbell clip) margin of non mass enhancement in the inferior right breast. No apparent complications. Electronically Signed: By: Frederico Hamman M.D. On: 05/27/2023 09:19   MM CLIP PLACEMENT RIGHT Result Date: 05/27/2023 CLINICAL DATA:  Post biopsy mammogram of the right breast for clip placement. EXAM: 3D DIAGNOSTIC RIGHT MAMMOGRAM POST MRI BIOPSY COMPARISON:  Previous exam(s). ACR Breast Density Category c: The breasts are heterogeneously dense, which may obscure small masses. FINDINGS: 3D Mammographic images were obtained following MRI guided biopsy of the anterior and posterior aspect of non mass enhancement in the inferior right breast. The biopsy marking clips are in expected position at the site of biopsy. Note that the biopsy marking clips are approximately 4.3 cm apart, which is less than the extent of the enhancement measured on MRI which extended between 6-7 cm. IMPRESSION: Appropriate positioning of the cylinder and barbell shaped biopsy marking clip at the sites of biopsy in the inferior right breast. The clips are 4.3 cm apart, however the extent of the non mass enhancement on MRI measures 6-7 cm in the anterior to posterior orientation. Final Assessment: Post Procedure Mammograms for Marker Placement Electronically Signed   By: Frederico Hamman M.D.   On: 05/27/2023 10:16    All questions were answered. The patient knows to call the clinic with any problems, questions or concerns. I spent 45 minutes in the care of this patient including H and P, review of records, counseling and coordination of care.     Rachel Moulds, MD 06/23/2023 12:10 PM

## 2023-06-28 DIAGNOSIS — D0501 Lobular carcinoma in situ of right breast: Secondary | ICD-10-CM | POA: Diagnosis not present

## 2023-07-01 ENCOUNTER — Telehealth: Payer: Self-pay | Admitting: Internal Medicine

## 2023-07-01 NOTE — Telephone Encounter (Signed)
 Patient contacted.  Patient does not prefer to reschedule a PFT again at this time.  Will route message to Dr. Maple Hudson for FYI.

## 2023-07-01 NOTE — Telephone Encounter (Signed)
 Called patient to get rescheduled for PFT since our tech will be out of office. This is her second time having to reschedule and she no longer wants a PFT. Sending as an Financial planner.

## 2023-07-07 DIAGNOSIS — F4321 Adjustment disorder with depressed mood: Secondary | ICD-10-CM | POA: Diagnosis not present

## 2023-08-04 DIAGNOSIS — F4321 Adjustment disorder with depressed mood: Secondary | ICD-10-CM | POA: Diagnosis not present

## 2023-09-05 DIAGNOSIS — F4321 Adjustment disorder with depressed mood: Secondary | ICD-10-CM | POA: Diagnosis not present

## 2023-09-14 ENCOUNTER — Encounter (HOSPITAL_BASED_OUTPATIENT_CLINIC_OR_DEPARTMENT_OTHER): Payer: Self-pay

## 2023-10-11 DIAGNOSIS — F4321 Adjustment disorder with depressed mood: Secondary | ICD-10-CM | POA: Diagnosis not present

## 2023-10-25 ENCOUNTER — Other Ambulatory Visit: Payer: Self-pay | Admitting: General Surgery

## 2023-10-25 DIAGNOSIS — D0501 Lobular carcinoma in situ of right breast: Secondary | ICD-10-CM

## 2023-10-31 DIAGNOSIS — N926 Irregular menstruation, unspecified: Secondary | ICD-10-CM | POA: Diagnosis not present

## 2023-10-31 DIAGNOSIS — Z124 Encounter for screening for malignant neoplasm of cervix: Secondary | ICD-10-CM | POA: Diagnosis not present

## 2023-10-31 DIAGNOSIS — Z0000 Encounter for general adult medical examination without abnormal findings: Secondary | ICD-10-CM | POA: Diagnosis not present

## 2023-11-07 ENCOUNTER — Ambulatory Visit: Payer: Medicaid Other | Admitting: Internal Medicine

## 2023-11-08 ENCOUNTER — Ambulatory Visit: Admitting: Internal Medicine

## 2023-11-08 DIAGNOSIS — F4321 Adjustment disorder with depressed mood: Secondary | ICD-10-CM | POA: Diagnosis not present

## 2023-11-14 DIAGNOSIS — M48062 Spinal stenosis, lumbar region with neurogenic claudication: Secondary | ICD-10-CM | POA: Diagnosis not present

## 2023-11-21 DIAGNOSIS — D259 Leiomyoma of uterus, unspecified: Secondary | ICD-10-CM | POA: Diagnosis not present

## 2023-11-21 DIAGNOSIS — N939 Abnormal uterine and vaginal bleeding, unspecified: Secondary | ICD-10-CM | POA: Diagnosis not present

## 2023-11-23 NOTE — Progress Notes (Signed)
 Patient ID: Shelley Norman, female    DOB: 04-19-1970, 53 y.o.   MRN: [id number]   HPI F never smoker, followed for Allergic Rhinitis and Cough, complicated by osteoarthritis,  Had seen Dr Darlean for upper airway cough> gabapentin , prilosec/pepcid, Singulair ,   Has worked with Allergist.> ST Pos for grass, trees, ragweed, weed, mold, cat and dust mites. Negative to common foods.  =========================================================   05/09/23- 52 yoF never smoker, followed for Allergic Rhinitis, chronic Cough, complicated by osteoarthritis,  Had seen Dr Darlean for upper airway cough> gabapentin , prilosec/pepcid, Singulair ,   Has worked with Allergist.> ST Pos for grass, trees, ragweed, weed, mold, cat and dust mites. Negative to common foods. > Breo 100, Ryaltris  nasal spray, Ventolin  hfa, - Trelegy 100, Singulair , Ventolin  hfa, Promethazine -DM, chlorpheniramine, Tussionex,  Labs for Eos, IgE, Hypersensitivity Pneumonia panel have been  negative in past year.  Discussed the use of AI scribe software for clinical note transcription with the patient, who gave verbal consent to proceed.  History of Present Illness   The patient, with a history of chronic bronchitis, presents with a worsening of her chronic cough and congestion. She believes she contracted a virus at a concert a week ago, which she subsequently passed on to her sister and brother-in-law. She reports feeling better but is struggling with congestion that she feels needs to be expelled.  She has previously worked with Dr. Darlean on upper airway cough and was prescribed gabapentin . She has also been taking Tessalon  Perles, which initially caused diarrhea but is now well-tolerated with pre-dosing Kaopectate. She reports that the Tessalon  Perles have been effective in managing her symptoms and plans to refill her prescription.  The patient also reports significant postnasal drip, requiring her to sleep elevated and constantly blow  her nose. She has had blood tests for allergies in the past year, all of which were negative. She has also seen an allergist for skin testing.  She has a history of acid reflux and has tried several treatments without significant improvement. She has been managing her symptoms with Singulair , which she has not needed to take daily until her recent exacerbation.  The patient also mentions a recent chest x-ray at [facility], which was clear, and a need to refill her Trelegy inhaler. She expresses interest in getting the RSV vaccine- discussed.   Assessment and Plan:    Chronic Bronchitis with Exacerbation Recent worsening of chronic cough and congestion, likely due to a viral infection. Tessalon  Perles have been effective and are well-tolerated with Kaopectate. No recent chest x-ray abnormalities. -Continue Tessalon  Perles as needed. -Consider Mucinex DM for additional mucus thinning and mild cough suppression. -Order Pulmonary Function Test for further evaluation of chronic cough.  Postnasal Drip Chronic issue, contributing to cough. No recent allergy testing abnormalities. -Continue current management strategies, including sleeping elevated.  General Health Maintenance Discussed RSV vaccine, patient considering whether to receive it. -Advise patient that RSV vaccine is well-tolerated and may be beneficial, but decision is ultimately up to the patient.  Refill Request Trelegy inhaler -Send in refill for Trelegy inhaler.   11/24/23-53 yoF never smoker, followed for Allergic Rhinitis, chronic Cough, complicated by osteoarthritis,  Had seen Dr Darlean for upper airway cough> gabapentin , prilosec/pepcid, Singulair ,   Has worked with Allergist.> ST Pos for grass, trees, ragweed, weed, mold, cat and dust mites. Negative to common foods. > Breo 100, Ryaltris  nasal spray, Ventolin  hfa, - Trelegy 100, Singulair , Ventolin  hfa, Promethazine -DM, chlorpheniramine, Tussionex,  Labs for Eos, IgE,  Hypersensitivity Pneumonia panel have been Negative. ----Doing well.  Some cough x 1 week. Discussed the use of AI scribe software for clinical note transcription with the patient, who gave verbal consent to proceed.  History of Present Illness   Shelley Norman is a 53 year old female with asthma and allergic rhinitis who presents with a recent exacerbation of cough.  She has experienced a cough over the past week, possibly triggered by attending an outdoor festival. She has not used Singulair  for approximately four months and has only recently needed to take allergy medication. She used her inhaler one night due to severe coughing and also took Tussinex  for two nights, which helped alleviate her symptoms. She feels current meds provide adequate control.     Assessment and Plan:    Allergic rhinitis Well-managed without Singulair  for months. Recent exacerbation likely due to outdoor exposure, managed with allergy medication and inhaler. - Refill meds for use as needed.  Cough Asthma mild intermittent with recent exacerbation Acute cough likely triggered by allergic rhinitis exacerbation. Managed with inhaler and Desenex, currently under control. - Use inhaler as needed.       Review of Systems-See HPI Constitutional:   No-   weight loss, night sweats, fevers, chills, fatigue, lassitude. HEENT:   No-  headaches, difficulty swallowing, tooth/dental problems, sore throat,       No-  sneezing, itching, ear ache, +nasal congestion, post nasal drip,  CV:  No-   chest pain, orthopnea, PND, swelling in lower extremities, anasarca, dizziness, palpitations Resp: No-   shortness of breath with exertion or at rest.              No-   productive cough,  + non-productive cough,  No- coughing up of blood.              No-   change in color of mucus.  No- wheezing.   Skin: No-   rash or lesions. GI:  No-   heartburn, indigestion, abdominal pain, nausea, vomiting,  GU:  MS:  No-   joint pain or  swelling.  . Neuro-     nothing unusual Psych:  No- change in mood or affect. No depression or anxiety.  No memory loss.    Objective:   Physical Exam General- Alert, Oriented, Affect-appropriate, Distress- none acute Skin- rash-none, lesions- none, excoriation- none Lymphadenopathy- none Head- atraumatic            Eyes- Gross vision intact, PERRLA, conjunctivae clear secretions            Ears- Hearing, canals-normal            Nose- Clear, no-Septal dev, mucus, polyps, erosion, perforation             Throat- Mallampati II , mucosa clear , drainage- none, tonsils- atrophic; torus Neck- flexible , trachea midline, no stridor , thyroid  nl, carotid no bruit Chest - symmetrical excursion , unlabored           Heart/CV- RRR , no murmur , no gallop  , no rub, nl s1 s2           - JVD- none , edema- none, stasis changes- none, varices- none            Lung- clear to P&A, wheeze- none, cough-none , dullness-none, rub- none           Chest wall-  Abd-  Br/ Gen/ Rectal- Not done, not indicated Extrem- cyanosis- none, clubbing,  none, atrophy- none, strength- nl Neuro- grossly intact to observation

## 2023-11-24 ENCOUNTER — Encounter: Payer: Self-pay | Admitting: Internal Medicine

## 2023-11-24 ENCOUNTER — Ambulatory Visit (INDEPENDENT_AMBULATORY_CARE_PROVIDER_SITE_OTHER): Admitting: Internal Medicine

## 2023-11-24 VITALS — BP 122/80 | HR 82 | Temp 98.4°F | Ht 61.75 in | Wt 156.4 lb

## 2023-11-24 DIAGNOSIS — J4489 Other specified chronic obstructive pulmonary disease: Secondary | ICD-10-CM | POA: Diagnosis not present

## 2023-11-24 MED ORDER — HYDROCOD POLI-CHLORPHE POLI ER 10-8 MG/5ML PO SUER: 5.0000 mL | Freq: Every day | ORAL | 0 refills | Status: AC | PRN

## 2023-11-24 NOTE — Patient Instructions (Signed)
 Script sent refilling Tussionex  Ok to continue current meds

## 2023-11-25 ENCOUNTER — Ambulatory Visit
Admission: RE | Admit: 2023-11-25 | Discharge: 2023-11-25 | Disposition: A | Discharge status: Home / Self Care | Source: Ambulatory Visit | Attending: General Surgery | Admitting: General Surgery

## 2023-11-25 DIAGNOSIS — D0501 Lobular carcinoma in situ of right breast: Secondary | ICD-10-CM | POA: Diagnosis not present

## 2023-11-25 MED ORDER — GADOPICLENOL 0.5 MMOL/ML IV SOLN
7.5000 mL | Freq: Once | INTRAVENOUS | Status: AC | PRN
  Administered 2023-11-25: 7.5 mL via INTRAVENOUS

## 2023-11-28 ENCOUNTER — Encounter: Payer: Self-pay | Admitting: Internal Medicine

## 2023-11-29 DIAGNOSIS — D0501 Lobular carcinoma in situ of right breast: Secondary | ICD-10-CM | POA: Diagnosis not present

## 2023-11-29 DIAGNOSIS — Z803 Family history of malignant neoplasm of breast: Secondary | ICD-10-CM | POA: Diagnosis not present

## 2023-11-29 DIAGNOSIS — Z9189 Other specified personal risk factors, not elsewhere classified: Secondary | ICD-10-CM | POA: Diagnosis not present

## 2023-12-01 DIAGNOSIS — D251 Intramural leiomyoma of uterus: Secondary | ICD-10-CM | POA: Diagnosis not present

## 2023-12-01 DIAGNOSIS — M25561 Pain in right knee: Secondary | ICD-10-CM | POA: Diagnosis not present

## 2023-12-01 DIAGNOSIS — M25562 Pain in left knee: Secondary | ICD-10-CM | POA: Diagnosis not present

## 2023-12-01 DIAGNOSIS — M170 Bilateral primary osteoarthritis of knee: Secondary | ICD-10-CM | POA: Diagnosis not present

## 2023-12-12 DIAGNOSIS — D0502 Lobular carcinoma in situ of left breast: Secondary | ICD-10-CM | POA: Diagnosis not present

## 2023-12-14 DIAGNOSIS — D0501 Lobular carcinoma in situ of right breast: Secondary | ICD-10-CM | POA: Diagnosis not present

## 2023-12-15 ENCOUNTER — Ambulatory Visit (INDEPENDENT_AMBULATORY_CARE_PROVIDER_SITE_OTHER): Payer: Medicaid Other | Admitting: Dermatology

## 2023-12-15 ENCOUNTER — Encounter: Payer: Self-pay | Admitting: Dermatology

## 2023-12-15 VITALS — BP 128/93 | HR 70

## 2023-12-15 DIAGNOSIS — L729 Follicular cyst of the skin and subcutaneous tissue, unspecified: Secondary | ICD-10-CM

## 2023-12-15 DIAGNOSIS — L700 Acne vulgaris: Secondary | ICD-10-CM

## 2023-12-15 DIAGNOSIS — L858 Other specified epidermal thickening: Secondary | ICD-10-CM

## 2023-12-15 MED ORDER — DOXYCYCLINE HYCLATE 100 MG PO CAPS: 100.0000 mg | ORAL_CAPSULE | Freq: Every day | ORAL | 0 refills | Status: DC

## 2023-12-15 MED ORDER — TAZAROTENE 0.1 % EX FOAM: 1.0000 | Freq: Every evening | CUTANEOUS | 9 refills | Status: DC

## 2023-12-15 MED ORDER — TAZAROTENE 0.1 % EX FOAM: 1.0000 | Freq: Every evening | CUTANEOUS | 9 refills | Status: AC

## 2023-12-15 NOTE — Progress Notes (Signed)
 New Patient Visit   Subjective  Shelley Norman is a 53 y.o. female who presents for the following: Acne  Patient states she has acne located at the back that she would like to have examined. Patient reports the areas have been there for 2 years. She reports the areas are not bothersome. She states that the areas have not spread. Patient reports she has previously been treated for these areas, patient reports that she has been prescribed tretinoin 0.1% previously was on 0.025% and clindamycin. Patient denies Hx of bx.  Patient has some like dark spots on her legs she would like to address she was told to use amlactin and it is not helping her.  The patient has spots, moles and lesions to be evaluated, some may be new or changing and the patient may have concern these could be cancer.   The following portions of the chart were reviewed this encounter and updated as appropriate: medications, allergies, medical history  Review of Systems:  No other skin or systemic complaints except as noted in HPI or Assessment and Plan.  Objective  Well appearing patient in no apparent distress; mood and affect are within normal limits.  A focused examination was performed of the following areas: back and legs  Relevant exam findings are noted in the Assessment and Plan.              Assessment & Plan    1. Acne Vulgaris with Hormonal Component - Assessment: Patient has been using tretinoin 0.1% for over 1.5 years with minimal improvement. Two persistent cystic lesions present. Recent use of charcoal soap and salicylic acid spray has reduced breakout frequency to approximately once a month. Examination reveals dark spots on the back without active acne lesions. The lack of response to high-strength tretinoin and the patient's age suggest hormonal factors may be contributing to her acne. However, hormonal treatments are contraindicated due to her breast cancer history. - Plan:    Discontinue  tretinoin 0.1%    Start tazarotene  foam or cream, apply every other night to face and back    Mix with moisturizer for back application    Prescribe doxycycline  100 mg daily with dinner for 1 week for use during acne flares    Continue salicylic acid spray in the morning or before gym    Start benzoyl peroxide 4% wash after gym sessions    Provide samples of CeraVe benzoyl peroxide 4% wash and Avne Cicalfate+ cream  2. Keratosis Pilaris with Hyperpigmentation - Assessment: Patient presents with hyperpigmentation and occasional bumpiness on the legs, consistent with keratosis pilaris. This condition is causing cosmetic concern for the patient. - Plan:    Recommend Eucerin Radiant Tone cream for morning and night application    Apply tretinoin to legs 2-3 nights per week, mixed with moisturizer    Switch to tazarotene  for leg application once new prescription is filled    Emphasize importance of sun protection  3. Cystic Lesion on Back - Assessment: Patient has a large, hard cystic lesion on the back, likely a deeply clogged pore. Surgical excision would result in significant scarring. - Plan:    Monitor response to tazarotene  treatment  Follow up in spring to reassess and potentially increase tazarotene  frequency.    Return in about 6 months (around 06/13/2024) for Acne follow up .  I, Doyce Pan, CMA, am acting as scribe for [employer], DO.   Documentation: I have reviewed the above documentation for accuracy and completeness,  and I agree with the above.  Delon Lenis, DO

## 2023-12-15 NOTE — Patient Instructions (Addendum)
 Date: Thu Dec 15 2023  Hello Shelley Norman,  Thank you for visiting today. Here is a summary of the key instructions:  - Medications:   - Switch from tretinoin 0.1% to tazarotene  foam or cream   - Apply every other night to face and back   - Mix with moisturizer when applying to back   - Use doxycycline  100 mg once daily with dinner for 1 week during acne flares   - Take with food to avoid upset stomach  - Skin Care:   - Use salicylic acid spray in the morning or before gym   - Apply tazarotene  at night (not with salicylic acid)   - Use CeraVe 4% benzoyl peroxide wash after gym   - Apply Avene Cicalfate cream as last step on nights using tazarotene    - For legs apply Eucerin Radiant Tone morning and night   - Use tretinoin (later tazarotene ) 2-3 nights a week mixed with moisturizer on legs   - Avoid salicylic acid before sun exposure   - Use sunscreen when in the sun, especially on legs  - Follow-up:   - Return for follow-up appointment in March  - Other Instructions:   - Wipe down gym equipment before and after use   - Message through MyChart for any questions  Please reach out if you have any questions or concerns.  Warm regards,  Dr. Delon Lenis [facility]           Important Information  Due to recent changes in healthcare laws, you may see results of your pathology and/or laboratory studies on MyChart before the doctors have had a chance to review them. We understand that in some cases there may be results that are confusing or concerning to you. Please understand that not all results are received at the same time and often the doctors may need to interpret multiple results in order to provide you with the best plan of care or course of treatment. Therefore, we ask that you please give us  2 business days to thoroughly review all your results before contacting the office for clarification. Should we see a critical lab result, you will be contacted sooner.   If You  Need Anything After Your Visit  If you have any questions or concerns for your doctor, please call our main line at [phone] If no one answers, please leave a voicemail as directed and we will return your call as soon as possible. Messages left after 4 pm will be answered the following business day.   You may also send us  a message via MyChart. We typically respond to MyChart messages within 1-2 business days.  For prescription refills, please ask your pharmacy to contact our office. Our fax number is [phone].  If you have an urgent issue when the clinic is closed that cannot wait until the next business day, you can page your doctor at the number below.    Please note that while we do our best to be available for urgent issues outside of office hours, we are not available 24/7.   If you have an urgent issue and are unable to reach us , you may choose to seek medical care at your doctor's office, retail clinic, urgent care center, or emergency room.  If you have a medical emergency, please immediately call 911 or go to the emergency department. In the event of inclement weather, please call our main line at [phone] for an update on the status of any delays or closures.  Dermatology Medication Tips: Please keep the boxes that topical medications come in in order to help keep track of the instructions about where and how to use these. Pharmacies typically print the medication instructions only on the boxes and not directly on the medication tubes.   If your medication is too expensive, please contact our office at [phone] or send us  a message through MyChart.   We are unable to tell what your co-pay for medications will be in advance as this is different depending on your insurance coverage. However, we may be able to find a substitute medication at lower cost or fill out paperwork to get insurance to cover a needed medication.   If a prior authorization is required to get  your medication covered by your insurance company, please allow us  1-2 business days to complete this process.  Drug prices often vary depending on where the prescription is filled and some pharmacies may offer cheaper prices.  The website www.goodrx.com contains coupons for medications through different pharmacies. The prices here do not account for what the cost may be with help from insurance (it may be cheaper with your insurance), but the website can give you the price if you did not use any insurance.  - You can print the associated coupon and take it with your prescription to the pharmacy.  - You may also stop by our office during regular business hours and pick up a GoodRx coupon card.  - If you need your prescription sent electronically to a different pharmacy, notify our office through [facility] or by phone at [phone]

## 2023-12-16 DIAGNOSIS — D0501 Lobular carcinoma in situ of right breast: Secondary | ICD-10-CM | POA: Diagnosis not present

## 2023-12-20 ENCOUNTER — Encounter: Payer: Self-pay | Admitting: Hematology and Oncology

## 2023-12-21 ENCOUNTER — Inpatient Hospital Stay: Admitting: Hematology and Oncology

## 2023-12-21 DIAGNOSIS — Z9189 Other specified personal risk factors, not elsewhere classified: Secondary | ICD-10-CM | POA: Diagnosis not present

## 2023-12-22 DIAGNOSIS — F4321 Adjustment disorder with depressed mood: Secondary | ICD-10-CM | POA: Diagnosis not present

## 2024-01-04 DIAGNOSIS — M545 Low back pain, unspecified: Secondary | ICD-10-CM | POA: Diagnosis not present

## 2024-01-12 ENCOUNTER — Encounter: Payer: Self-pay | Admitting: Nurse Practitioner

## 2024-01-12 ENCOUNTER — Ambulatory Visit: Payer: 59 | Admitting: Nurse Practitioner

## 2024-01-12 VITALS — BP 128/80 | HR 87 | Temp 97.1°F | Ht 61.75 in | Wt 156.0 lb

## 2024-01-12 DIAGNOSIS — G8929 Other chronic pain: Secondary | ICD-10-CM

## 2024-01-12 DIAGNOSIS — D0501 Lobular carcinoma in situ of right breast: Secondary | ICD-10-CM

## 2024-01-12 DIAGNOSIS — E559 Vitamin D deficiency, unspecified: Secondary | ICD-10-CM | POA: Diagnosis not present

## 2024-01-12 DIAGNOSIS — M545 Low back pain, unspecified: Secondary | ICD-10-CM | POA: Diagnosis not present

## 2024-01-12 DIAGNOSIS — E7800 Pure hypercholesterolemia, unspecified: Secondary | ICD-10-CM

## 2024-01-12 DIAGNOSIS — Z0000 Encounter for general adult medical examination without abnormal findings: Secondary | ICD-10-CM | POA: Diagnosis not present

## 2024-01-12 DIAGNOSIS — M5442 Lumbago with sciatica, left side: Secondary | ICD-10-CM

## 2024-01-12 DIAGNOSIS — E663 Overweight: Secondary | ICD-10-CM

## 2024-01-12 DIAGNOSIS — J4489 Other specified chronic obstructive pulmonary disease: Secondary | ICD-10-CM

## 2024-01-12 DIAGNOSIS — M159 Polyosteoarthritis, unspecified: Secondary | ICD-10-CM

## 2024-01-12 MED ORDER — COVID-19 MRNA VAC-TRIS(PFIZER) 30 MCG/0.3ML IM SUSY: 0.3000 mL | PREFILLED_SYRINGE | Freq: Once | INTRAMUSCULAR | 0 refills | Status: AC

## 2024-01-12 NOTE — Patient Instructions (Signed)
 It was great to see you!  Schedule a lab visit when you can come back  I hope your surgery goes well!   Let's follow-up in 1 year, sooner if you have concerns.  If a referral was placed today, you will be contacted for an appointment. Please note that routine referrals can sometimes take up to 3-4 weeks to process. Please call our office if you haven't heard anything after this time frame.  Take care,  Tinnie Harada, NP

## 2024-01-12 NOTE — Progress Notes (Signed)
 BP 128/80 (BP Location: Left Arm, Patient Position: Sitting, Cuff Size: Normal)   Pulse 87   Temp (!) 97.1 F (36.2 C)   Ht 5' 1.75 (1.568 m)   Wt 156 lb (70.8 kg)   LMP 10/15/2023   SpO2 98%   BMI 28.76 kg/m    Subjective:    Patient ID: Shelley Norman, female    DOB: [birth year], 53 y.o.   MRN: [id number]  CC: Chief Complaint  Patient presents with   Annual Exam    Request labs to done later, Covid Rx    HPI: Shelley Norman is a 53 y.o. female presenting on 01/12/2024 for comprehensive medical examination. Current medical complaints include:none  Depression and Anxiety Screen done today and results listed below:     01/12/2024    2:17 PM 04/15/2023   10:40 AM 01/10/2023    2:59 PM 11/07/2020    1:44 PM 03/23/2016    3:42 PM  Depression screen PHQ 2/9  Decreased Interest 0 2 1 0 0  Down, Depressed, Hopeless 1 2 2  0 0  PHQ - 2 Score 1 4 3  0 0  Altered sleeping 1  1 0   Tired, decreased energy 1  1 2    Change in appetite 0  0 0   Feeling bad or failure about yourself  0  1 0   Trouble concentrating 1  0 0   Moving slowly or fidgety/restless 0  0 0   Suicidal thoughts 0  0 0   PHQ-9 Score 4  6 2    Difficult doing work/chores Somewhat difficult  Somewhat difficult Not difficult at all       01/12/2024    2:18 PM 01/10/2023    2:59 PM 11/07/2020    1:44 PM  GAD 7 : Generalized Anxiety Score  Nervous, Anxious, on Edge 1 2 0  Control/stop worrying 0 2 0  Worry too much - different things 0 2 0  Trouble relaxing 1 1 1   Restless 0 0 0  Easily annoyed or irritable 1 2 0  Afraid - awful might happen 0 1 0  Total GAD 7 Score 3 10 1   Anxiety Difficulty Somewhat difficult Somewhat difficult Not difficult at all    The patient does not have a history of falls. I did not complete a risk assessment for falls. A plan of care for falls was not documented.   Past Medical History:  Past Medical History:  Diagnosis Date   Allergic rhinitis    Bacterial infection     Dense breast tissue 11/07/2020   Fibroids    Mastodynia    Osteoarthritis    feet, knees, lower back - no meds   PONV (postoperative nausea and vomiting)    Vaginismus    Vitamin D  deficiency    Yeast infection     Surgical History:  Past Surgical History:  Procedure Laterality Date   FOOT SURGERY     bilateral 2 right and 2 left foot surgeries - totatl 4   HAMMER TOE SURGERY  2007   left foot - toe next to pinky   MYOMECTOMY N/A 01/08/2014   Procedure: Abdominal MYOMECTOMY;  Surgeon: Shanda SHAUNNA Muscat, MD;  Location: [facility];  Service: Gynecology;  Laterality: N/A;   WISDOM TOOTH EXTRACTION      Medications:  Current Outpatient Medications on File Prior to Visit  Medication Sig   albuterol  (VENTOLIN  HFA) 108 (90 Base) MCG/ACT inhaler Inhale 2 puffs into the  lungs every 4 (four) hours as needed for wheezing or shortness of breath (coughing fits).   Bee Pollen 1000 MG TABS Take 1 tablet by mouth daily.     benzonatate  (TESSALON ) 200 MG capsule TAKE 1 CAPSULE (200 MG TOTAL) BY MOUTH 3 (THREE) TIMES DAILY AS NEEDED FOR COUGH.   Biotin 2.5 MG TABS Take 1 tablet by mouth daily.     celecoxib (CELEBREX) 200 MG capsule Take 1 capsule every day by oral route as needed.   chlorpheniramine (CHLOR-TRIMETON) 4 MG tablet Take 4 mg by mouth daily at 12 noon. (Patient taking differently: Take 4 mg by mouth as needed.)   chlorpheniramine-HYDROcodone (TUSSIONEX) 10-8 MG/5ML Take 5 mLs by mouth daily as needed.   Cholecalciferol (VITAMIN D3) 2000 UNITS TABS Take 1 tablet by mouth daily.     doxycycline  (VIBRAMYCIN ) 100 MG capsule Take 1 capsule (100 mg total) by mouth daily.   Fluticasone -Umeclidin-Vilant (TRELEGY ELLIPTA ) 100-62.5-25 MCG/ACT AEPB Inhale 1 puff then rinse mouth, once daily (Patient taking differently: as needed. Inhale 1 puff then rinse mouth, once daily)   ibuprofen  (ADVIL ,MOTRIN ) 600 MG tablet 1  po  pc every 6 hours for 5 days then prn-pain (Patient taking differently: Take 600  mg by mouth as needed. 1  po  pc every 6 hours for 5 days then prn-pain)   loratadine (CLARITIN) 10 MG tablet Take 10 mg by mouth daily as needed for allergies.    Magnesium 250 MG TABS Take 1 tablet by mouth daily.     Misc Natural Products (OSTEO BI-FLEX ADV DOUBLE ST PO) Take 1 tablet by mouth 2 (two) times daily.   (Patient taking differently: Take 1 tablet by mouth as needed.)   montelukast  (SINGULAIR ) 10 MG tablet TAKE 1 TABLET BY MOUTH AT BEDTIME (Patient taking differently: Take 10 mg by mouth as needed.)   Omega-3 Fatty Acids (FISH OIL) 1000 MG CPDR Take 1 capsule by mouth daily.   Tazarotene  0.1 % FOAM Apply 1 Application topically at bedtime. Apply every other night   tretinoin (RETIN-A) 0.025 % cream Apply to back nightly   VOLTAREN 1 % GEL Apply 2 g topically 2 (two) times daily as needed (arthritis pain in knee).    No current facility-administered medications on file prior to visit.    Allergies:  Allergies  Allergen Reactions   Tetracycline Other (See Comments) and Itching    Yeast infection Yeast infection     Social History:  Social History   Socioeconomic History   Marital status: Single    Spouse name: Not on file   Number of children: Not on file   Years of education: Not on file   Highest education level: Not on file  Occupational History   Occupation: Consultant  Tobacco Use   Smoking status: Never    Passive exposure: Never   Smokeless tobacco: Never  Vaping Use   Vaping status: Never Used  Substance and Sexual Activity   Alcohol use: Yes    Alcohol/week: 1.0 standard drink of alcohol    Types: 1 Glasses of wine per week    Comment: socially   Drug use: No   Sexual activity: Not Currently    Partners: Male    Birth control/protection: Pill  Other Topics Concern   Not on file  Social History Narrative   Exercise---  daily   Social Drivers of Health   Financial Resource Strain: Not on file  Food Insecurity: Not on file  Transportation  Needs: Not  on file  Physical Activity: Not on file  Stress: Not on file  Social Connections: Not on file  Intimate Partner Violence: Not on file   Social History   Tobacco Use  Smoking Status Never   Passive exposure: Never  Smokeless Tobacco Never   Social History   Substance and Sexual Activity  Alcohol Use Yes   Alcohol/week: 1.0 standard drink of alcohol   Types: 1 Glasses of wine per week   Comment: socially    Family History:  Family History  Problem Relation Age of Onset   Hypertension Mother    Hyperlipidemia Mother    Arthritis Mother    Diabetes Father    Allergic rhinitis Sister    Breast cancer Sister 34   Breast cancer Sister 66   Osteoporosis Other    Colon cancer Neg Hx    Colon polyps Neg Hx    Esophageal cancer Neg Hx    Stomach cancer Neg Hx    Rectal cancer Neg Hx     Past medical history, surgical history, medications, allergies, family history and social history reviewed with patient today and changes made to appropriate areas of the chart.   Review of Systems  Constitutional: Negative.   HENT: Negative.    Eyes: Negative.   Respiratory: Negative.    Cardiovascular: Negative.   Gastrointestinal: Negative.   Genitourinary: Negative.   Musculoskeletal:  Positive for back pain and joint pain.  Skin: Negative.   Neurological: Negative.   Psychiatric/Behavioral: Negative.     All other ROS negative except what is listed above and in the HPI.      Objective:    BP 128/80 (BP Location: Left Arm, Patient Position: Sitting, Cuff Size: Normal)   Pulse 87   Temp (!) 97.1 F (36.2 C)   Ht 5' 1.75 (1.568 m)   Wt 156 lb (70.8 kg)   LMP 10/15/2023   SpO2 98%   BMI 28.76 kg/m   Wt Readings from Last 3 Encounters:  01/12/24 156 lb (70.8 kg)  11/24/23 156 lb 6.4 oz (70.9 kg)  06/23/23 157 lb 1.6 oz (71.3 kg)    Physical Exam Vitals and nursing note reviewed.  Constitutional:      General: She is not in acute distress.    Appearance:  Normal appearance.  HENT:     Head: Normocephalic and atraumatic.     Right Ear: Tympanic membrane, ear canal and external ear normal.     Left Ear: Tympanic membrane, ear canal and external ear normal.     Mouth/Throat:     Mouth: Mucous membranes are moist.     Pharynx: No posterior oropharyngeal erythema.  Eyes:     Conjunctiva/sclera: Conjunctivae normal.  Cardiovascular:     Rate and Rhythm: Normal rate and regular rhythm.     Pulses: Normal pulses.     Heart sounds: Normal heart sounds.  Pulmonary:     Effort: Pulmonary effort is normal.     Breath sounds: Normal breath sounds.  Abdominal:     Palpations: Abdomen is soft.     Tenderness: There is no abdominal tenderness.  Musculoskeletal:        General: Normal range of motion.     Cervical back: Normal range of motion and neck supple.     Right lower leg: No edema.     Left lower leg: No edema.  Lymphadenopathy:     Cervical: No cervical adenopathy.  Skin:    General: Skin is warm  and dry.  Neurological:     General: No focal deficit present.     Mental Status: She is alert and oriented to person, place, and time.     Cranial Nerves: No cranial nerve deficit.     Coordination: Coordination normal.     Gait: Gait normal.  Psychiatric:        Mood and Affect: Mood normal.        Behavior: Behavior normal.        Thought Content: Thought content normal.        Judgment: Judgment normal.     Results for orders placed or performed in visit on 05/27/23  Surgical pathology   Collection Time: 05/27/23 12:00 AM  Result Value Ref Range   SURGICAL PATHOLOGY      SURGICAL PATHOLOGY [facility] [address], [address] [place], [place] [address] Telephone [phone] or [phone] Fax [phone]  REPORT OF SURGICAL PATHOLOGY   Accession #: [id number] Patient Name: MAYA, ARCAND Visit # :   MRN: [id number] Physician: Gerome Browning DOB/Age Jan 03, 1971 (Age: 45) Gender:  F Collected Date: 05/27/2023 Received Date: 05/27/2023  FINAL DIAGNOSIS       1. 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      Case is reviewed by Dr. Macie who concurs with the interpretation.      H[facility]ricks at [facility] notified of diagnoses by Dr.      Reed on 05/30/2023 at 8:30 AM.      ELECTRONIC SIGNATURE : Picklesimer Md, Fred , Sports administrator, International aid/development worker  MICROSCOPIC DESCRIPTION  CASE COMMENTS STAINS USED IN DIAGNOSIS: H&E-2 H&E-3 H&E-4 H&E *RECUT 1 SLIDE Universal Negative Control-DAB H&E-2 H&E-3 H&E-4 H&E Stains used in diagnosis 1 E-CAD, 1 ER-ACIS, 1 PR-ACIS, 2 E-CAD Some of these immunohistochemical stains may have been developed and the performance characteristics determined by [facility].  Some may not have been cleared or approved by the U.S. Food and Drug Administration.  Th e FDA has determined that such clearance or approval is not necessary.  This test is used for clinical purposes.  It should not be regarded as investigational or for research.  This laboratory is certified under the Clinical Laboratory Improvement Amendments of 1988 (CLIA-88) as qualified to perform  high complexity clinical laboratory testing. Estrogen receptor (6F11), immunohistochemical stains are performed on formalin fixed, paraffin embedded tissue using a 3,3-diaminobenzidine (DAB) chromogen and Leica Bond Autostainer System.  The staining intensity of the nucleus is scored manually and is reported as the percentage of tumor cell nuclei demonstrating specific nuclear  staining.Specimens are fixed in 10% Neutral Buffered Formalin for at least 6 hours and up to 72 hours.  These tests have not be validated on decalcified tissue.  Results should be interpreted with caution given the possibility of false negative results on decalcified specimens. PR progesterone receptor  (16), immunohistochemical stains are performed on formalin fixed, paraffin embedded tissue using a 3,3-diaminobenzidine (DAB) chromogen and Leica Bond Autostainer System.  The staining intensity of the nucleus is scored manually and is reported as the percentage of tumor cell nuclei demonstrating specific nuclear staining.Specimens are fixed in 10% Neutral Buffered Formalin for at least 6 hours and up to 72 hours. These tests have not be validated on decalcified tissue.  Results should be interpreted with caution given the possibility of false negative results on decalcified specimens.  ADDENDUM Breast, right, needle core biopsy, Inferior posterior (cylinder clip) PROGNOSTIC INDICATORS  Results: IMMUNOHISTOCHEMICAL AND MORPHOMETRIC ANALYSIS PERFORMED MANUALLY Estrogen Receptor:  95%, POSITIVE, STRONG STAINING INTENSITY Progesterone Receptor:  60%, POSITIVE, WEAK-MODERATE STAINING INTENSITY REFERENCE RANGE ESTROGEN RECEPTOR NEGATIVE     0% POSITIVE       =>1% REFER ENCE RANGE PROGESTERONE RECEPTOR NEGATIVE     0% POSITIVE        =>1% All controls stained appropriately Belvie Rolla Rush, Pathologist, Electronic Signature [id number] 04 2025)   CLINICAL HISTORY  SPECIMEN(S) OBTAINED 1. Breast, right,  needle core biopsy, Inferior Posterior (cylinder Clip) 2. Breast, right, needle core biopsy, Inferior Anterior (barbell Clip)  SPECIMEN COMMENTS: 1. TIF: 8:32am 2. TIF: 8:36am SPECIMEN CLINICAL INFORMATION: 1. Non mass enhancement suspicious for malignancy/DCIS in the right breast, patient with high risk for breast cancer    Gross Description 1. Received in formalin labeled with the patient's name and right breast # 1 inferior posterior, and consists of a 2.0 x 2.0 x 0.3 cm aggregate of tan yellow fibroadipose tissue.The specimen is entirely submitted in one cassette.      TIF 8:32 a.m. on 05/27/23.  CIT less than 5 mins. 2. Received in formalin labeled with the patient's name and right breast # 2 inferior anterior, and consi sts of a 2.1 x 1.9 x 0.3 cm aggregate of tan yellow fibroadipose tissue.The specimen is entirely submitted in one cassette.      TIF 8:36 a.m. on 05/27/23. CIT less than 5 mins.  (KL:gt, 05/27/23)        Report signed out from the following location(s) [facility] [address] [place], [place] [address] CLIA #: [id number]  [facility] [address] [place], [place] [address] CLIA #: [id number]       Assessment & Plan:   Problem List Items Addressed This Visit       Respiratory   Asthmatic bronchitis , chronic (HCC)   Chronic, stable. Continue trelegy and albuterol  inhaler as needed. Covid-19 vaccine RX printed for patient.         Nervous and Auditory   Chronic midline low back pain with left-sided sciatica   Chronic, stable. Continue celebrex 200mg  daily and following with ortho.         Musculoskeletal and Integument   Osteoarthritis   Chronic, stable.  She is currently following with orthopedics.  She gets injections in her knees regularly and she had PRP injections in her back. Continue celebrex 200mg  daily and following with ortho.         Other   Vitamin D  deficiency   She is currently taking  an over-the-counter  vitamin D  supplement daily.  Will check vitamin D  levels and adjust regimen based on results.      Relevant Orders   VITAMIN D  25 Hydroxy (Vit-D Deficiency, Fractures)   Overweight   BMI 28.8.  Information given on nutrition and exercise.      Pure hypercholesterolemia   Chronic, stable. Will update CMP, CBC, lipid panel today.       Relevant Orders   CBC with Differential/Platelet   Comprehensive metabolic panel with GFR   Lipid panel   Routine general medical examination at a health care facility - Primary   Health maintenance reviewed and updated. Discussed nutrition, exercise. Follow-up 1 year.        Lobular carcinoma in situ (LCIS) of right breast   She is following with oncology and plans to have a bilateral mastectomy since tamoxifen  did not show any improvement. Continue collaboration and recommendations from specialists.         Follow up plan: Return in about 1 year (around 01/11/2025) for CPE.   LABORATORY TESTING:  - Pap smear: up to date  IMMUNIZATIONS:   - Tdap: Tetanus vaccination status reviewed: last tetanus booster within 10 years. - Influenza: Declined - Pneumovax: Not applicable - Prevnar: Up to date - HPV: Not applicable - Shingrix vaccine: Up to date  SCREENING: -Mammogram: Up to date  - Colonoscopy: Up to date  - Bone Density: Not applicable   PATIENT COUNSELING:   Advised to take 1 mg of folate supplement per day if capable of pregnancy.   Sexuality: Discussed sexually transmitted diseases, partner selection, use of condoms, avoidance of unintended pregnancy  and contraceptive alternatives.   Advised to avoid cigarette smoking.  I discussed with the patient that most people either abstain from alcohol or drink within safe limits (<=14/week and <=4 drinks/occasion for males, <=7/weeks and <= 3 drinks/occasion for females) and that the risk for alcohol disorders and other health effects rises proportionally with the  number of drinks per week and how often a drinker exceeds daily limits.  Discussed cessation/primary prevention of drug use and availability of treatment for abuse.   Diet: Encouraged to adjust caloric intake to maintain  or achieve ideal body weight, to reduce intake of dietary saturated fat and total fat, to limit sodium intake by avoiding high sodium foods and not adding table salt, and to maintain adequate dietary potassium and calcium preferably from fresh fruits, vegetables, and low-fat dairy products.    stressed the importance of regular exercise  Injury prevention: Discussed safety belts, safety helmets, smoke detector, smoking near bedding or upholstery.   Dental health: Discussed importance of regular tooth brushing, flossing, and dental visits.    NEXT PREVENTATIVE PHYSICAL DUE IN 1 YEAR. Return in about 1 year (around 01/11/2025) for CPE.  Mckala Pantaleon A Hanna Ra

## 2024-01-13 ENCOUNTER — Other Ambulatory Visit

## 2024-01-13 ENCOUNTER — Telehealth: Payer: Self-pay

## 2024-01-13 DIAGNOSIS — Z87898 Personal history of other specified conditions: Secondary | ICD-10-CM

## 2024-01-13 NOTE — Telephone Encounter (Signed)
 Copied from CRM #[id number]. Topic: Clinical - Request for Lab/Test Order >> Jan 13, 2024 12:03 PM [name] wrote: Reason for CRM: patient would like orders place to be tested for Rheumatoid arthritis. Patient would like a message sent via MyChart once orders are placed so she can reschedule her lab appt.

## 2024-01-14 NOTE — Assessment & Plan Note (Signed)
BMI 28.8.  Information given on nutrition and exercise.

## 2024-01-14 NOTE — Assessment & Plan Note (Signed)
 She is following with oncology and plans to have a bilateral mastectomy since tamoxifen  did not show any improvement. Continue collaboration and recommendations from specialists.

## 2024-01-14 NOTE — Assessment & Plan Note (Signed)
 Chronic, stable. Will update CMP, CBC, lipid panel today.

## 2024-01-14 NOTE — Assessment & Plan Note (Signed)
 Chronic, stable.  She is currently following with orthopedics.  She gets injections in her knees regularly and she had PRP injections in her back. Continue celebrex 200mg  daily and following with ortho.

## 2024-01-14 NOTE — Assessment & Plan Note (Signed)
 Health maintenance reviewed and updated. Discussed nutrition, exercise. Follow-up 1 year.

## 2024-01-14 NOTE — Assessment & Plan Note (Signed)
 Chronic, stable. Continue celebrex 200mg  daily and following with ortho.

## 2024-01-14 NOTE — Assessment & Plan Note (Signed)
She is currently taking an over the counter vitamin D supplement daily. Will check vitamin D levels and adjust regimen based on results.

## 2024-01-14 NOTE — Assessment & Plan Note (Signed)
 Chronic, stable. Continue trelegy and albuterol  inhaler as needed. Covid-19 vaccine RX printed for patient.

## 2024-01-16 DIAGNOSIS — M545 Low back pain, unspecified: Secondary | ICD-10-CM | POA: Diagnosis not present

## 2024-01-17 DIAGNOSIS — D0501 Lobular carcinoma in situ of right breast: Secondary | ICD-10-CM | POA: Diagnosis not present

## 2024-01-22 ENCOUNTER — Other Ambulatory Visit: Payer: Self-pay | Admitting: Dermatology

## 2024-01-24 DIAGNOSIS — D0501 Lobular carcinoma in situ of right breast: Secondary | ICD-10-CM | POA: Diagnosis not present

## 2024-01-24 DIAGNOSIS — D259 Leiomyoma of uterus, unspecified: Secondary | ICD-10-CM | POA: Diagnosis not present

## 2024-01-25 DIAGNOSIS — D0501 Lobular carcinoma in situ of right breast: Secondary | ICD-10-CM | POA: Diagnosis not present

## 2024-02-06 ENCOUNTER — Encounter: Payer: Self-pay | Admitting: Nurse Practitioner

## 2024-02-15 DIAGNOSIS — M545 Low back pain, unspecified: Secondary | ICD-10-CM | POA: Diagnosis not present

## 2024-02-21 ENCOUNTER — Other Ambulatory Visit (INDEPENDENT_AMBULATORY_CARE_PROVIDER_SITE_OTHER)

## 2024-02-21 DIAGNOSIS — E7800 Pure hypercholesterolemia, unspecified: Secondary | ICD-10-CM | POA: Diagnosis not present

## 2024-02-21 DIAGNOSIS — Z87898 Personal history of other specified conditions: Secondary | ICD-10-CM

## 2024-02-21 DIAGNOSIS — E559 Vitamin D deficiency, unspecified: Secondary | ICD-10-CM

## 2024-02-21 DIAGNOSIS — F4321 Adjustment disorder with depressed mood: Secondary | ICD-10-CM | POA: Diagnosis not present

## 2024-02-21 LAB — COMPREHENSIVE METABOLIC PANEL WITH GFR
ALT: 37 U/L — ABNORMAL HIGH (ref 0–35)
AST: 30 U/L (ref 0–37)
Albumin: 4.8 g/dL (ref 3.5–5.2)
Alkaline Phosphatase: 88 U/L (ref 39–117)
BUN: 14 mg/dL (ref 6–23)
CO2: 31 meq/L (ref 19–32)
Calcium: 10.2 mg/dL (ref 8.4–10.5)
Chloride: 101 meq/L (ref 96–112)
Creatinine, Ser: 0.98 mg/dL (ref 0.40–1.20)
GFR: 65.4 mL/min (ref >60.00)
Glucose, Bld: 87 mg/dL (ref 70–99)
Potassium: 3.5 meq/L (ref 3.5–5.1)
Sodium: 138 meq/L (ref 135–145)
Total Bilirubin: 0.4 mg/dL (ref 0.2–1.2)
Total Protein: 7.9 g/dL (ref 6.0–8.3)

## 2024-02-21 LAB — CBC WITH DIFFERENTIAL/PLATELET
Basophils Absolute: 0 K/uL (ref 0.0–0.1)
Basophils Relative: 1.2 % (ref 0.0–3.0)
Eosinophils Absolute: 0.4 K/uL (ref 0.0–0.7)
Eosinophils Relative: 11 % — ABNORMAL HIGH (ref 0.0–5.0)
HCT: 35.7 % — ABNORMAL LOW (ref 36.0–46.0)
Hemoglobin: 11.9 g/dL — ABNORMAL LOW (ref 12.0–15.0)
Lymphocytes Relative: 50.6 % — ABNORMAL HIGH (ref 12.0–46.0)
Lymphs Abs: 1.9 K/uL (ref 0.7–4.0)
MCHC: 33.3 g/dL (ref 30.0–36.0)
MCV: 91.9 fl (ref 78.0–100.0)
Monocytes Absolute: 0.2 K/uL (ref 0.1–1.0)
Monocytes Relative: 5.8 % (ref 3.0–12.0)
Neutro Abs: 1.2 K/uL — ABNORMAL LOW (ref 1.4–7.7)
Neutrophils Relative %: 31.4 % — ABNORMAL LOW (ref 43.0–77.0)
Platelets: 271 K/uL (ref 150.0–400.0)
RBC: 3.88 Mil/uL (ref 3.87–5.11)
RDW: 13.5 % (ref 11.5–15.5)
WBC: 3.8 K/uL — ABNORMAL LOW (ref 4.0–10.5)

## 2024-02-21 LAB — LIPID PANEL
Cholesterol: 220 mg/dL — ABNORMAL HIGH (ref 0–200)
HDL: 68.8 mg/dL (ref >39.00)
LDL Cholesterol: 132 mg/dL — ABNORMAL HIGH (ref 0–99)
NonHDL: 151.21
Total CHOL/HDL Ratio: 3
Triglycerides: 97 mg/dL (ref 0.0–149.0)
VLDL: 19.4 mg/dL (ref 0.0–40.0)

## 2024-02-21 LAB — VITAMIN D 25 HYDROXY (VIT D DEFICIENCY, FRACTURES): VITD: 37.31 ng/mL (ref 30.00–100.00)

## 2024-02-22 ENCOUNTER — Ambulatory Visit: Payer: Self-pay | Admitting: Nurse Practitioner

## 2024-02-22 LAB — ANA W/REFLEX: Anti Nuclear Antibody (ANA): NEGATIVE

## 2024-02-22 LAB — RHEUMATOID FACTOR: Rheumatoid fact SerPl-aCnc: <10 [IU]/mL (ref <14)

## 2024-03-13 DIAGNOSIS — F4321 Adjustment disorder with depressed mood: Secondary | ICD-10-CM | POA: Diagnosis not present

## 2024-03-26 ENCOUNTER — Encounter: Payer: Self-pay | Admitting: Nurse Practitioner

## 2024-03-26 ENCOUNTER — Ambulatory Visit: Admitting: Nurse Practitioner

## 2024-03-26 VITALS — BP 124/78 | HR 86 | Temp 97.5°F | Ht 61.75 in | Wt 159.8 lb

## 2024-03-26 DIAGNOSIS — R030 Elevated blood-pressure reading, without diagnosis of hypertension: Secondary | ICD-10-CM | POA: Insufficient documentation

## 2024-03-26 NOTE — Patient Instructions (Signed)
 It was great to see you!  Continue limiting salt in your diet and getting regular exercise   Come back next week for a nurse visit to check your blood pressure  I will write a note to your surgeon as well.   Let's follow-up with any concerns   Take care,  Tinnie Harada, NP

## 2024-03-26 NOTE — Assessment & Plan Note (Signed)
 Blood pressure is well-controlled with recent readings, and no antihypertensive medication is needed. Continue lifestyle modifications, including a low sodium diet and regular exercise. A nurse visit is scheduled for a blood pressure recheck next week. Will write a note to surgeon office with recent blood pressure readings.

## 2024-03-26 NOTE — Progress Notes (Signed)
 "  Established Patient Office Visit  Subjective   Patient ID: Shelley Norman, female    DOB: 21-Feb-1971  Age: 53 y.o. MRN: [id number]  Chief Complaint  Patient presents with   Blood Pressure Elevated    Pre-Op appointment BP was elevated    HPI Discussed the use of AI scribe software for clinical note transcription with the patient, who gave verbal consent to proceed.  History of Present Illness   Shelley Norman is a 53 year old female who presents with concerns about elevated blood pressure affecting her upcoming breast surgery.  She reports her blood pressure was elevated at a preoperative visit on December 26, which led her surgeon to question proceeding with breast surgery. BP was 131/94 and 129/99 on repeat. She feels the elevation was related to high-salt holiday foods and anxiety about the surgery. She is worried about the surgeons requirement for several weeks of consistently lower readings, which may delay surgery. She exercises about four days per week and plans to increase to five. She limits sodium and has resumed magnesium and fish oil. She is frustrated and anxious about the possibility that further delay to March or April may occur if her blood pressure is not documented as stable enough for clearance. She denies chest pain and shortness of breath.       ROS See pertinent positives and negatives per HPI.    Objective:     BP 124/78 (BP Location: Left Arm, Patient Position: Sitting, Cuff Size: Normal)   Pulse 86   Temp (!) 97.5 F (36.4 C)   Ht 5' 1.75 (1.568 m)   Wt 159 lb 12.8 oz (72.5 kg)   LMP 10/13/2023   SpO2 97%   BMI 29.46 kg/m  BP Readings from Last 3 Encounters:  03/26/24 124/78  01/12/24 128/80  12/15/23 (!) 128/93   Wt Readings from Last 3 Encounters:  03/26/24 159 lb 12.8 oz (72.5 kg)  01/12/24 156 lb (70.8 kg)  11/24/23 156 lb 6.4 oz (70.9 kg)      Physical Exam Vitals and nursing note reviewed.  Constitutional:      General: She  is not in acute distress.    Appearance: Normal appearance.  HENT:     Head: Normocephalic.  Eyes:     Conjunctiva/sclera: Conjunctivae normal.  Cardiovascular:     Rate and Rhythm: Normal rate and regular rhythm.     Pulses: Normal pulses.     Heart sounds: Normal heart sounds.  Pulmonary:     Effort: Pulmonary effort is normal.     Breath sounds: Normal breath sounds.  Musculoskeletal:     Cervical back: Normal range of motion.  Skin:    General: Skin is warm.  Neurological:     General: No focal deficit present.     Mental Status: She is alert and oriented to person, place, and time.  Psychiatric:        Mood and Affect: Mood normal.        Behavior: Behavior normal.        Thought Content: Thought content normal.        Judgment: Judgment normal.    The 10-year ASCVD risk score (Arnett DK, et al., 2019) is: 2.3%    Assessment & Plan:   Problem List Items Addressed This Visit       Other   Elevated blood pressure reading without diagnosis of hypertension - Primary   Blood pressure is well-controlled with recent readings, and no  antihypertensive medication is needed. Continue lifestyle modifications, including a low sodium diet and regular exercise. A nurse visit is scheduled for a blood pressure recheck next week. Will write a note to surgeon office with recent blood pressure readings.        Return if symptoms worsen or fail to improve.    Tinnie DELENA Harada, NP  "

## 2024-03-27 ENCOUNTER — Encounter: Payer: Self-pay | Admitting: Nurse Practitioner

## 2024-04-02 ENCOUNTER — Ambulatory Visit

## 2024-04-02 DIAGNOSIS — I10 Essential (primary) hypertension: Secondary | ICD-10-CM | POA: Diagnosis not present

## 2024-04-02 NOTE — Progress Notes (Signed)
 "  Blood Pressure Recheck Visit  Name: Shelley Norman MRN: [id number] Date of Birth: May 10, 1970  Shelley Norman presents today for Blood Pressure recheck with clinical support staff.   Order for BP recheck by Tinnie Harada, ordered on 03/26/24.   BP Readings from Last 3 Encounters:  03/26/24 124/78  01/12/24 128/80  12/15/23 (!) 128/93   Today's BP reading: 130/80 sitting in the right arm, and 124/80 sitting in the right arm.  Current Outpatient Medications  Medication Sig Dispense Refill   albuterol  (VENTOLIN  HFA) 108 (90 Base) MCG/ACT inhaler Inhale 2 puffs into the lungs every 4 (four) hours as needed for wheezing or shortness of breath (coughing fits). 18 g 5   Bee Pollen 1000 MG TABS Take 1 tablet by mouth daily.   (Patient taking differently: Take 1 tablet by mouth as needed.)     benzonatate  (TESSALON ) 200 MG capsule TAKE 1 CAPSULE (200 MG TOTAL) BY MOUTH 3 (THREE) TIMES DAILY AS NEEDED FOR COUGH. 30 capsule 5   Biotin 2.5 MG TABS Take 1 tablet by mouth daily.       celecoxib (CELEBREX) 200 MG capsule Take 1 capsule every day by oral route as needed. (Patient taking differently: Take 200 mg by mouth as needed.)     chlorpheniramine (CHLOR-TRIMETON) 4 MG tablet Take 4 mg by mouth daily at 12 noon. (Patient taking differently: Take 4 mg by mouth as needed.)     chlorpheniramine-HYDROcodone (TUSSIONEX) 10-8 MG/5ML Take 5 mLs by mouth daily as needed. 150 mL 0   Cholecalciferol (VITAMIN D3) 2000 UNITS TABS Take 1 tablet by mouth daily.       doxycycline  (VIBRAMYCIN ) 100 MG capsule TAKE 1 CAPSULE BY MOUTH EVERY DAY (Patient taking differently: Take 100 mg by mouth every other day.) 30 capsule 0   Fluticasone -Umeclidin-Vilant (TRELEGY ELLIPTA ) 100-62.5-25 MCG/ACT AEPB Inhale 1 puff then rinse mouth, once daily (Patient taking differently: as needed. Inhale 1 puff then rinse mouth, once daily) 60 each 12   ibuprofen  (ADVIL ,MOTRIN ) 600 MG tablet 1  po  pc every 6 hours for 5 days then  prn-pain (Patient taking differently: Take 600 mg by mouth as needed. 1  po  pc every 6 hours for 5 days then prn-pain) 30 tablet 1   loratadine (CLARITIN) 10 MG tablet Take 10 mg by mouth daily as needed for allergies.      Magnesium 250 MG TABS Take 1 tablet by mouth daily.       Misc Natural Products (OSTEO BI-FLEX ADV DOUBLE ST PO) Take 1 tablet by mouth 2 (two) times daily.   (Patient taking differently: Take 1 tablet by mouth as needed.)     montelukast  (SINGULAIR ) 10 MG tablet TAKE 1 TABLET BY MOUTH AT BEDTIME (Patient taking differently: Take 10 mg by mouth as needed.) 30 tablet 6   Omega-3 Fatty Acids (FISH OIL) 1000 MG CPDR Take 1 capsule by mouth daily.     Tazarotene  0.1 % FOAM Apply 1 Application topically at bedtime. Apply every other night 100 g 9   tretinoin (RETIN-A) 0.025 % cream Apply to back nightly     VOLTAREN 1 % GEL Apply 2 g topically 2 (two) times daily as needed (arthritis pain in knee).      No current facility-administered medications for this visit.    Hypertensive Medication Review: Patient is not prescribed any hypertensive medications.  Documentation of any medication adherence discrepancies: none  Provider Recommendation:  Spoke to N/A and they stated: No  need to speak to PCP due to normal BP reading.  Patient has been scheduled to follow up with PCP, Tinnie Harada on 01/17/2025.   "

## 2024-04-02 NOTE — Patient Instructions (Addendum)
 Shelley Norman

## 2024-04-18 ENCOUNTER — Encounter: Payer: Self-pay | Admitting: Dermatology

## 2024-04-30 ENCOUNTER — Other Ambulatory Visit: Payer: Self-pay | Admitting: Orthopedic Surgery

## 2024-04-30 DIAGNOSIS — M5416 Radiculopathy, lumbar region: Secondary | ICD-10-CM

## 2024-05-03 ENCOUNTER — Encounter: Payer: Self-pay | Admitting: Orthopedic Surgery

## 2024-05-07 ENCOUNTER — Other Ambulatory Visit

## 2024-05-08 ENCOUNTER — Other Ambulatory Visit

## 2024-06-13 ENCOUNTER — Ambulatory Visit: Admitting: Dermatology

## 2025-01-17 ENCOUNTER — Encounter: Admitting: Nurse Practitioner
